# Patient Record
Sex: Female | Born: 1950 | Race: White | Hispanic: No | Marital: Married | Smoking: Former smoker
Health system: Southern US, Community
[De-identification: ages and names within clinical notes are randomized; demographics above are authoritative.]

## PROBLEM LIST (undated history)

## (undated) DIAGNOSIS — B2790 Infectious mononucleosis, unspecified without complication: Secondary | ICD-10-CM

## (undated) DIAGNOSIS — M5136 Other intervertebral disc degeneration, lumbar region: Secondary | ICD-10-CM

## (undated) DIAGNOSIS — E785 Hyperlipidemia, unspecified: Secondary | ICD-10-CM

## (undated) DIAGNOSIS — M51369 Other intervertebral disc degeneration, lumbar region without mention of lumbar back pain or lower extremity pain: Secondary | ICD-10-CM

## (undated) DIAGNOSIS — M5126 Other intervertebral disc displacement, lumbar region: Secondary | ICD-10-CM

## (undated) DIAGNOSIS — Z9289 Personal history of other medical treatment: Secondary | ICD-10-CM

## (undated) DIAGNOSIS — D649 Anemia, unspecified: Secondary | ICD-10-CM

## (undated) DIAGNOSIS — M4800 Spinal stenosis, site unspecified: Secondary | ICD-10-CM

## (undated) DIAGNOSIS — M479 Spondylosis, unspecified: Secondary | ICD-10-CM

## (undated) DIAGNOSIS — C801 Malignant (primary) neoplasm, unspecified: Secondary | ICD-10-CM

## (undated) DIAGNOSIS — F419 Anxiety disorder, unspecified: Secondary | ICD-10-CM

## (undated) DIAGNOSIS — R42 Dizziness and giddiness: Secondary | ICD-10-CM

## (undated) DIAGNOSIS — I10 Essential (primary) hypertension: Secondary | ICD-10-CM

## (undated) HISTORY — PX: OTHER SURGICAL HISTORY: SHX169

## (undated) HISTORY — DX: Other intervertebral disc degeneration, lumbar region without mention of lumbar back pain or lower extremity pain: M51.369

## (undated) HISTORY — DX: Anemia, unspecified: D64.9

## (undated) HISTORY — DX: Spinal stenosis, site unspecified: M48.00

## (undated) HISTORY — DX: Spondylosis, unspecified: M47.9

## (undated) HISTORY — DX: Infectious mononucleosis, unspecified without complication: B27.90

## (undated) HISTORY — PX: INGUINAL HERNIA REPAIR: SUR1180

## (undated) HISTORY — DX: Other intervertebral disc displacement, lumbar region: M51.26

## (undated) HISTORY — DX: Essential (primary) hypertension: I10

## (undated) HISTORY — DX: Anxiety disorder, unspecified: F41.9

## (undated) HISTORY — DX: Dizziness and giddiness: R42

## (undated) HISTORY — DX: Disorder of iron metabolism, unspecified: E83.10

## (undated) HISTORY — PX: APPENDECTOMY: SHX54

## (undated) HISTORY — DX: Personal history of other medical treatment: Z92.89

## (undated) HISTORY — DX: Hyperlipidemia, unspecified: E78.5

## (undated) HISTORY — DX: Other intervertebral disc degeneration, lumbar region: M51.36

## (undated) HISTORY — PX: ABDOMINAL HYSTERECTOMY: SHX81

---

## 1997-05-17 LAB — CONVERTED CEMR LAB: Pap Smear: NORMAL

## 2001-08-22 ENCOUNTER — Emergency Department (HOSPITAL_COMMUNITY): Admission: EM | Admit: 2001-08-22 | Discharge: 2001-08-22 | Payer: Self-pay | Admitting: Emergency Medicine

## 2007-06-09 ENCOUNTER — Encounter: Payer: Self-pay | Admitting: Internal Medicine

## 2007-08-14 ENCOUNTER — Ambulatory Visit: Payer: Self-pay | Admitting: Internal Medicine

## 2007-08-14 DIAGNOSIS — B2790 Infectious mononucleosis, unspecified without complication: Secondary | ICD-10-CM

## 2007-08-14 DIAGNOSIS — R5383 Other fatigue: Secondary | ICD-10-CM

## 2007-08-14 HISTORY — DX: Infectious mononucleosis, unspecified without complication: B27.90

## 2007-08-14 LAB — CONVERTED CEMR LAB
Bilirubin Urine: NEGATIVE
Glucose, Urine, Semiquant: NEGATIVE
HCV Ab: NEGATIVE
Hep B Core Total Ab: NEGATIVE
Hep B S Ab: NEGATIVE
Hepatitis B Surface Ag: NEGATIVE
Ketones, urine, test strip: NEGATIVE
Nitrite: NEGATIVE
Protein, U semiquant: NEGATIVE
Specific Gravity, Urine: 1.015
Urobilinogen, UA: 0.2
Vit D, 1,25-Dihydroxy: 14 — ABNORMAL LOW (ref 30–89)
WBC Urine, dipstick: NEGATIVE
pH: 5

## 2007-08-15 ENCOUNTER — Encounter: Payer: Self-pay | Admitting: Internal Medicine

## 2007-08-18 LAB — CONVERTED CEMR LAB
BUN: 21 mg/dL (ref 6–23)
Basophils Absolute: 0.1 10*3/uL (ref 0.0–0.1)
Basophils Relative: 0.9 % (ref 0.0–1.0)
CO2: 29 meq/L (ref 19–32)
Calcium: 9.3 mg/dL (ref 8.4–10.5)
Chloride: 105 meq/L (ref 96–112)
Cholesterol: 286 mg/dL (ref 0–200)
Creatinine, Ser: 1 mg/dL (ref 0.4–1.2)
Direct LDL: 223.3 mg/dL
Eosinophils Absolute: 0.2 10*3/uL (ref 0.0–0.7)
Eosinophils Relative: 2.1 % (ref 0.0–5.0)
Free T4: 0.9 ng/dL (ref 0.6–1.6)
GFR calc Af Amer: 73 mL/min
GFR calc non Af Amer: 61 mL/min
Glucose, Bld: 90 mg/dL (ref 70–99)
HCT: 43.7 % (ref 36.0–46.0)
HDL: 50.1 mg/dL (ref >39.0)
Hemoglobin: 14.1 g/dL (ref 12.0–15.0)
Lymphocytes Relative: 22.2 % (ref 12.0–46.0)
MCHC: 32.3 g/dL (ref 30.0–36.0)
MCV: 87.9 fL (ref 78.0–100.0)
Monocytes Absolute: 0.4 10*3/uL (ref 0.1–1.0)
Monocytes Relative: 5 % (ref 3.0–12.0)
Neutro Abs: 6.1 10*3/uL (ref 1.4–7.7)
Neutrophils Relative %: 69.8 % (ref 43.0–77.0)
Platelets: 307 10*3/uL (ref 150–400)
Potassium: 4.2 meq/L (ref 3.5–5.1)
RBC: 4.97 M/uL (ref 3.87–5.11)
RDW: 12.7 % (ref 11.5–14.6)
Sed Rate: 12 mm/hr (ref 0–22)
Sodium: 140 meq/L (ref 135–145)
T3, Free: 2.7 pg/mL (ref 2.3–4.2)
TSH: 1.48 microintl units/mL (ref 0.35–5.50)
Total CHOL/HDL Ratio: 5.7
Triglycerides: 133 mg/dL (ref 0–149)
VLDL: 27 mg/dL (ref 0–40)
WBC: 8.7 10*3/uL (ref 4.5–10.5)

## 2007-08-21 ENCOUNTER — Encounter: Payer: Self-pay | Admitting: Internal Medicine

## 2007-08-28 ENCOUNTER — Encounter: Payer: Self-pay | Admitting: Internal Medicine

## 2007-10-24 ENCOUNTER — Encounter: Payer: Self-pay | Admitting: Internal Medicine

## 2007-11-13 ENCOUNTER — Ambulatory Visit: Payer: Self-pay | Admitting: Internal Medicine

## 2007-11-22 LAB — CONVERTED CEMR LAB: Vit D, 1,25-Dihydroxy: 37 (ref 30–89)

## 2008-01-09 ENCOUNTER — Ambulatory Visit: Payer: Self-pay | Admitting: Internal Medicine

## 2008-01-09 ENCOUNTER — Other Ambulatory Visit: Admission: RE | Admit: 2008-01-09 | Discharge: 2008-01-09 | Payer: Self-pay | Admitting: Internal Medicine

## 2008-01-09 ENCOUNTER — Encounter: Payer: Self-pay | Admitting: Internal Medicine

## 2008-01-09 DIAGNOSIS — E785 Hyperlipidemia, unspecified: Secondary | ICD-10-CM | POA: Insufficient documentation

## 2008-03-28 ENCOUNTER — Ambulatory Visit: Payer: Self-pay | Admitting: Internal Medicine

## 2008-03-31 LAB — CONVERTED CEMR LAB
ALT: 21 units/L (ref 0–35)
AST: 25 units/L (ref 0–37)
Albumin: 4.2 g/dL (ref 3.5–5.2)
Alkaline Phosphatase: 59 units/L (ref 39–117)
Bilirubin, Direct: 0.1 mg/dL (ref 0.0–0.3)
Cholesterol: 307 mg/dL (ref 0–200)
Direct LDL: 214.5 mg/dL
HDL: 42.9 mg/dL (ref >39.0)
Total Bilirubin: 1.1 mg/dL (ref 0.3–1.2)
Total CHOL/HDL Ratio: 7.2
Total Protein: 7.2 g/dL (ref 6.0–8.3)
Triglycerides: 122 mg/dL (ref 0–149)
VLDL: 24 mg/dL (ref 0–40)

## 2008-04-03 ENCOUNTER — Ambulatory Visit: Payer: Self-pay | Admitting: Internal Medicine

## 2008-06-05 ENCOUNTER — Ambulatory Visit: Payer: Self-pay | Admitting: Internal Medicine

## 2008-06-05 LAB — CONVERTED CEMR LAB
AST: 27 units/L (ref 0–37)
Albumin: 3.8 g/dL (ref 3.5–5.2)
BUN: 13 mg/dL (ref 6–23)
Calcium: 9.4 mg/dL (ref 8.4–10.5)
Cholesterol: 151 mg/dL (ref 0–200)
Creatinine, Ser: 1 mg/dL (ref 0.4–1.2)
GFR calc Af Amer: 73 mL/min
GFR calc non Af Amer: 61 mL/min
HDL: 43.9 mg/dL (ref >39.0)
LDL Cholesterol: 97 mg/dL (ref 0–99)
Triglycerides: 51 mg/dL (ref 0–149)
VLDL: 10 mg/dL (ref 0–40)

## 2008-06-12 ENCOUNTER — Ambulatory Visit: Payer: Self-pay | Admitting: Internal Medicine

## 2008-06-12 DIAGNOSIS — L659 Nonscarring hair loss, unspecified: Secondary | ICD-10-CM | POA: Insufficient documentation

## 2008-09-05 ENCOUNTER — Encounter: Payer: Self-pay | Admitting: Internal Medicine

## 2008-09-11 ENCOUNTER — Ambulatory Visit: Payer: Self-pay | Admitting: Internal Medicine

## 2008-09-11 DIAGNOSIS — I10 Essential (primary) hypertension: Secondary | ICD-10-CM

## 2008-09-16 LAB — CONVERTED CEMR LAB
CO2: 32 meq/L (ref 19–32)
Calcium: 9.2 mg/dL (ref 8.4–10.5)
Chloride: 107 meq/L (ref 96–112)
Potassium: 5.1 meq/L (ref 3.5–5.1)
Sodium: 145 meq/L (ref 135–145)

## 2009-06-02 ENCOUNTER — Ambulatory Visit: Payer: Self-pay | Admitting: Internal Medicine

## 2009-06-02 LAB — CONVERTED CEMR LAB
ALT: 19 units/L (ref 0–35)
AST: 23 units/L (ref 0–37)
Albumin: 4.3 g/dL (ref 3.5–5.2)
Alkaline Phosphatase: 56 units/L (ref 39–117)
BUN: 17 mg/dL (ref 6–23)
Basophils Absolute: 0 10*3/uL (ref 0.0–0.1)
Basophils Relative: 0.3 % (ref 0.0–3.0)
Bilirubin Urine: NEGATIVE
CO2: 28 meq/L (ref 19–32)
Chloride: 105 meq/L (ref 96–112)
Cholesterol: 156 mg/dL (ref 0–200)
Creatinine, Ser: 1.1 mg/dL (ref 0.4–1.2)
Eosinophils Relative: 4.1 % (ref 0.0–5.0)
HCT: 43 % (ref 36.0–46.0)
Hemoglobin: 14.1 g/dL (ref 12.0–15.0)
Ketones, urine, test strip: NEGATIVE
LDL Cholesterol: 87 mg/dL (ref 0–99)
Lymphocytes Relative: 30.2 % (ref 12.0–46.0)
Lymphs Abs: 2.2 10*3/uL (ref 0.7–4.0)
Monocytes Relative: 7.9 % (ref 3.0–12.0)
Neutro Abs: 4.2 10*3/uL (ref 1.4–7.7)
RBC: 4.87 M/uL (ref 3.87–5.11)
Specific Gravity, Urine: 1.02
TSH: 2.86 microintl units/mL (ref 0.35–5.50)
Total Protein: 7.3 g/dL (ref 6.0–8.3)
Triglycerides: 134 mg/dL (ref 0.0–149.0)
Urobilinogen, UA: 1

## 2009-06-09 ENCOUNTER — Ambulatory Visit: Payer: Self-pay | Admitting: Internal Medicine

## 2009-06-09 DIAGNOSIS — R82998 Other abnormal findings in urine: Secondary | ICD-10-CM

## 2009-06-09 LAB — CONVERTED CEMR LAB
Ketones, urine, test strip: NEGATIVE
Nitrite: NEGATIVE
Urobilinogen, UA: 1
WBC Urine, dipstick: NEGATIVE

## 2009-06-11 ENCOUNTER — Encounter (INDEPENDENT_AMBULATORY_CARE_PROVIDER_SITE_OTHER): Payer: Self-pay | Admitting: *Deleted

## 2009-06-24 ENCOUNTER — Encounter (INDEPENDENT_AMBULATORY_CARE_PROVIDER_SITE_OTHER): Payer: Self-pay | Admitting: *Deleted

## 2009-06-25 ENCOUNTER — Ambulatory Visit: Payer: Self-pay | Admitting: Gastroenterology

## 2009-07-09 ENCOUNTER — Ambulatory Visit: Payer: Self-pay | Admitting: Gastroenterology

## 2009-07-09 LAB — HM COLONOSCOPY

## 2009-09-08 ENCOUNTER — Encounter: Payer: Self-pay | Admitting: Internal Medicine

## 2009-09-08 LAB — HM MAMMOGRAPHY: HM Mammogram: NORMAL

## 2009-09-09 ENCOUNTER — Ambulatory Visit: Payer: Self-pay | Admitting: Internal Medicine

## 2009-09-09 LAB — CONVERTED CEMR LAB
Bilirubin Urine: NEGATIVE
Nitrite: NEGATIVE
Protein, U semiquant: NEGATIVE
Urobilinogen, UA: 0.2

## 2009-09-10 LAB — CONVERTED CEMR LAB
BUN: 15 mg/dL (ref 6–23)
CO2: 31 meq/L (ref 19–32)
Glucose, Bld: 87 mg/dL (ref 70–99)
Potassium: 4.1 meq/L (ref 3.5–5.1)
Sodium: 141 meq/L (ref 135–145)

## 2010-06-05 ENCOUNTER — Other Ambulatory Visit: Payer: Self-pay | Admitting: Internal Medicine

## 2010-06-05 ENCOUNTER — Ambulatory Visit
Admission: RE | Admit: 2010-06-05 | Discharge: 2010-06-05 | Payer: Self-pay | Source: Home / Self Care | Attending: Internal Medicine | Admitting: Internal Medicine

## 2010-06-05 LAB — CBC WITH DIFFERENTIAL/PLATELET
Basophils Absolute: 0 10*3/uL (ref 0.0–0.1)
Basophils Relative: 0.4 % (ref 0.0–3.0)
Eosinophils Absolute: 0.3 10*3/uL (ref 0.0–0.7)
Eosinophils Relative: 3.7 % (ref 0.0–5.0)
HCT: 40.6 % (ref 36.0–46.0)
Hemoglobin: 13.6 g/dL (ref 12.0–15.0)
Lymphocytes Relative: 28.5 % (ref 12.0–46.0)
Lymphs Abs: 2.2 10*3/uL (ref 0.7–4.0)
MCHC: 33.4 g/dL (ref 30.0–36.0)
MCV: 86.6 fl (ref 78.0–100.0)
Monocytes Absolute: 0.5 10*3/uL (ref 0.1–1.0)
Monocytes Relative: 7 % (ref 3.0–12.0)
Neutro Abs: 4.6 10*3/uL (ref 1.4–7.7)
Neutrophils Relative %: 60.4 % (ref 43.0–77.0)
Platelets: 299 10*3/uL (ref 150.0–400.0)
RBC: 4.69 Mil/uL (ref 3.87–5.11)
RDW: 14.1 % (ref 11.5–14.6)
WBC: 7.7 10*3/uL (ref 4.5–10.5)

## 2010-06-05 LAB — BASIC METABOLIC PANEL
BUN: 18 mg/dL (ref 6–23)
CO2: 31 mEq/L (ref 19–32)
Calcium: 9.5 mg/dL (ref 8.4–10.5)
Chloride: 102 mEq/L (ref 96–112)
Creatinine, Ser: 1.1 mg/dL (ref 0.4–1.2)
GFR: 55.54 mL/min — ABNORMAL LOW (ref >60.00)
Glucose, Bld: 96 mg/dL (ref 70–99)
Potassium: 4.7 mEq/L (ref 3.5–5.1)
Sodium: 142 mEq/L (ref 135–145)

## 2010-06-05 LAB — LIPID PANEL
Cholesterol: 161 mg/dL (ref 0–200)
HDL: 42.2 mg/dL (ref >39.00)
LDL Cholesterol: 91 mg/dL (ref 0–99)
Total CHOL/HDL Ratio: 4
Triglycerides: 138 mg/dL (ref 0.0–149.0)
VLDL: 27.6 mg/dL (ref 0.0–40.0)

## 2010-06-05 LAB — HEPATIC FUNCTION PANEL
ALT: 17 U/L (ref 0–35)
AST: 19 U/L (ref 0–37)
Albumin: 4.2 g/dL (ref 3.5–5.2)
Alkaline Phosphatase: 68 U/L (ref 39–117)
Bilirubin, Direct: 0.2 mg/dL (ref 0.0–0.3)
Total Bilirubin: 1 mg/dL (ref 0.3–1.2)
Total Protein: 7 g/dL (ref 6.0–8.3)

## 2010-06-05 LAB — TSH: TSH: 2.08 u[IU]/mL (ref 0.35–5.50)

## 2010-06-12 ENCOUNTER — Encounter: Payer: Self-pay | Admitting: Internal Medicine

## 2010-06-12 ENCOUNTER — Ambulatory Visit
Admission: RE | Admit: 2010-06-12 | Discharge: 2010-06-12 | Payer: Self-pay | Source: Home / Self Care | Attending: Internal Medicine | Admitting: Internal Medicine

## 2010-06-12 DIAGNOSIS — E669 Obesity, unspecified: Secondary | ICD-10-CM | POA: Insufficient documentation

## 2010-06-18 NOTE — Assessment & Plan Note (Signed)
Summary: 3 MO ROV/MM   Vital Signs:  Patient profile:   60 year old female Menstrual status:  hysterectomy Weight:      153 pounds Pulse rate:   105 / minute BP sitting:   138 / 62  (left arm) Cuff size:   regular  Vitals Entered By: Romualdo Bolk, CMA Duncan Dull) (September 09, 2009 8:36 AM) CC: Follow-up visit on blood pressure, Hypertension Management   Serial Vital Signs/Assessments:  Time      Position  BP       Pulse  Resp  Temp     By 8:40 AM             134/80   105                   Romualdo Bolk, CMA (AAMA)                     118/78                         Madelin Headings MD  Comments: 8:40 AM Pt's machine By: Romualdo Bolk, CMA (AAMA)  sitting left   reg cuff   and machine  right   125/68 By: Madelin Headings MD    History of Present Illness: Kendra Mcfarland  comes in today  for above  . ? no change in readings at home tends to get nervous at times and bp will go up. But feels ok  130/80    range at home.  MOm has labile ht but doesnt always take meds.  NO UTI hx of hematuria. Got colonoscopy done  since last visit.  Hypertension History:      She complains of peripheral edema, but denies headache, chest pain, palpitations, dyspnea with exertion, orthopnea, PND, visual symptoms, neurologic problems, syncope, and side effects from treatment.  She notes no problems with any antihypertensive medication side effects.        Positive major cardiovascular risk factors include female age 9 years old or older, hyperlipidemia, and hypertension.  Negative major cardiovascular risk factors include non-tobacco-user status.    Preventive Care Screening  Mammogram:    Date:  09/08/2009    Results:  normal     Preventive Screening-Counseling & Management  Alcohol-Tobacco     Alcohol drinks/day: <1     Alcohol type: wine     Smoking Status: quit     Year Quit: 1996  Caffeine-Diet-Exercise     Caffeine use/day: 8 +     Does Patient Exercise: yes     Type of  exercise: Walking     Times/week: 4  Current Medications (verified): 1)  Vitamin D3 1000 Unit Tabs (Cholecalciferol) 2)  Crestor 10 Mg Tabs (Rosuvastatin Calcium) .Marland Kitchen.. 1 By Mouth Once Daily 3)  Lisinopril-Hydrochlorothiazide 20-12.5 Mg Tabs (Lisinopril-Hydrochlorothiazide) .Marland Kitchen.. 1 By Mouth Once Daily  Allergies (verified): 1)  ! Penicillin 2)  ! Sulfa 3)  ! * Aleve  Past History:  Past medical, surgical, family and social histories (including risk factors) reviewed for relevance to current acute and chronic problems.  Past Medical History: Reviewed history from 06/12/2008 and no changes required. g2p2 Blood transfusion  vaginal bleeding anemia years ago G2P2   Consults None [facility]    hx of basal cell.    Past Surgical History: Reviewed history from 04/03/2008 and no changes required. 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 Family History Thyroid disease- Sister Hyper and Sister Hypo Family History Hypertension- Mom and Sister Family History of Arthritis- Whole Family Family History High cholesterol mom and sister   Father 61 MI  prob had HT no DM     Mom had angioplasty 59  age.  73    Social History: Reviewed history from 06/09/2009 and no changes required. Former Smoker  no ets   Alcohol use-no Drug use-no Regular exercise-yes hhof 2  pet dog     Good sleep  Review of Systems  The patient denies anorexia, fever, chest pain, syncope, dyspnea on exertion, and peripheral edema.    Physical Exam  General:  alert, well-developed, well-nourished, and well-hydrated.   Head:  normocephalic and atraumatic.   Lungs:  normal respiratory effort, no intercostal retractions, and no accessory muscle use.   Heart:  normal  rate, regular rhythm, and no murmur.   Extremities:  no clubbing cyanosis or edema  Skin:  turgor normal and color normal.   Cervical Nodes:  No lymphadenopathy noted Psych:  Normal eye contact, appropriate affect. Cognition appears normal.    Impression & Recommendations:  Problem # 1:  HYPERTENSION (ICD-401.9) did bp cuff and outs and they correlate.    erratic readings but actually better in office  after resting.   and very good.    will continue meds and monitor  Her updated medication list for this problem includes:    Lisinopril-hydrochlorothiazide 20-12.5 Mg Tabs (Lisinopril-hydrochlorothiazide) .Marland Kitchen... 1 by mouth once daily  Orders: TLB-BMP (Basic Metabolic Panel-BMET) (80048-METABOL)  Problem # 2:  URINALYSIS, ABNORMAL (ICD-791.9) by DS unclear significance no signs  if abnormal can arrange for microscopic  if needed Orders: UA Dipstick w/o Micro (automated)  (81003)  Complete Medication List: 1)  Vitamin D3 1000 Unit Tabs (Cholecalciferol) 2)  Crestor 10 Mg Tabs (Rosuvastatin calcium) .Marland Kitchen.. 1 by mouth once daily 3)  Lisinopril-hydrochlorothiazide 20-12.5 Mg Tabs (Lisinopril-hydrochlorothiazide) .Marland Kitchen.. 1 by mouth once daily  Hypertension Assessment/Plan:      The patient's hypertensive risk group is category B: At least one risk factor (excluding diabetes) with no target organ damage.  Her calculated 10 year risk of coronary heart disease is 8 %.  Today's blood pressure is 138/62.  Her blood pressure goal is < 140/90.  Patient Instructions: 1)  You will be informed of lab results when available.  2)  conitinue monitoring your BP   3)  ROV  with cpx when due 2012 4)  or as needed.  Prevention & Chronic Care Immunizations   Influenza vaccine: Fluvax 3+  (06/09/2009)    Tetanus booster: 05/18/2007: Historical    Pneumococcal vaccine: Not documented  Colorectal Screening   Hemoccult: Not documented    Colonoscopy: DONE  (07/09/2009)   Colonoscopy due:  06/2019  Other Screening   Pap smear: Normal  (05/17/1997)    Mammogram: normal  (09/08/2009)   Smoking status: quit  (09/09/2009)  Lipids   Total Cholesterol: 156  (06/02/2009)   LDL: 87  (06/02/2009)   LDL Direct: 214.5  (03/28/2008)   HDL: 42.50  (06/02/2009)   Triglycerides: 134.0  (06/02/2009)    SGOT (AST): 23  (06/02/2009)   SGPT (ALT): 19  (06/02/2009)   Alkaline phosphatase: 56  (06/02/2009)   Total bilirubin: 0.8  (  06/02/2009)  Hypertension   Last Blood Pressure: 138 / 62  (09/09/2009)   Serum creatinine: 1.1  (06/02/2009)   Serum potassium 4.9  (06/02/2009)  Self-Management Support :    Hypertension self-management support: Not documented    Lipid self-management support: Not documented    Laboratory Results   Urine Tests    Routine Urinalysis   Color: yellow Appearance: Clear Glucose: negative   (Normal Range: Negative) Bilirubin: negative   (Normal Range: Negative) Ketone: negative   (Normal Range: Negative) Spec. Gravity: 1.015   (Normal Range: 1.003-1.035) Blood: trace-lysed   (Normal Range: Negative) pH: 6.5   (Normal Range: 5.0-8.0) Protein: negative   (Normal Range: Negative) Urobilinogen: 0.2   (Normal Range: 0-1) Nitrite: negative   (Normal Range: Negative) Leukocyte Esterace: negative   (Normal Range: Negative)    Comments: Rita Ohara  September 09, 2009 1:17 PM

## 2010-06-18 NOTE — Assessment & Plan Note (Signed)
Summary: cpx//ccm   Vital Signs:  Patient profile:   60 year old female Menstrual status:  hysterectomy Height:      59 inches Weight:      158 pounds BMI:     32.03 Pulse rate:   72 / minute BP sitting:   140 / 80  (left arm) Cuff size:   regular  Vitals Entered By: Romualdo Bolk, CMA (AAMA) (June 12, 2010 8:47 AM)  Nutrition Counseling: Patient's BMI is greater than 25 and therefore counseled on weight management options.  Serial Vital Signs/Assessments:  Time      Position  BP       Pulse  Resp  Temp     By                     120/78                         Madelin Headings MD  Comments: right arm sittin By: Madelin Headings MD   CC: CPX-Discuss doing a pap   History of Present Illness: Kendra Mcfarland comes in today    for preventive visit  Sinc e ast visit  no major changes in health.Doing weight watchers now. Cant take aleve. but has hx of  rx for ?  H x of spondyloethesis.    stiffer  than usual.   when arises from floor  but no difficulty walking  Aleve gave ger hand and itchiy palms and arms  BP usually 130 range no se of med LIPIDS: no se of meds    Preventive Care Screening  Prior Values:    Pap Smear:  Normal (05/17/1997)    Mammogram:  normal (09/08/2009)    Colonoscopy:  DONE (07/09/2009)    Last Tetanus Booster:  Historical (05/18/2007)   Preventive Screening-Counseling & Management  Alcohol-Tobacco     Alcohol drinks/day: <1     Alcohol type: wine     Smoking Status: quit     Year Quit: 1996  Caffeine-Diet-Exercise     Caffeine use/day: 8 +     Does Patient Exercise: yes     Type of exercise: Walking     Times/week: 4  Hep-HIV-STD-Contraception     Dental Visit-last 6 months yes     Sun Exposure-Excessive: no  Safety-Violence-Falls     Seat Belt Use: yes     Firearms in the Home: firearms in the home     Firearm Counseling: not indicated; uses recommended firearm safety measures     Smoke Detectors: yes  Current Medications  (verified): 1)  Vitamin D3 1000 Unit Tabs (Cholecalciferol) 2)  Crestor 10 Mg Tabs (Rosuvastatin Calcium) .Marland Kitchen.. 1 By Mouth Once Daily 3)  Lisinopril-Hydrochlorothiazide 20-12.5 Mg Tabs (Lisinopril-Hydrochlorothiazide) .Marland Kitchen.. 1 By Mouth Once Daily  Allergies (verified): 1)  ! Penicillin 2)  ! Sulfa 3)  ! * Aleve  Past History:  Past medical, surgical, family and social histories (including risk factors) reviewed, and no changes noted (except as noted below).  Past Medical History: Reviewed history from 06/12/2008 and no changes required. g2p2 Blood transfusion  vaginal bleeding anemia years ago G2P2   Consults None [facility]    hx of basal cell.    Past Surgical History: Reviewed history from 04/03/2008 and no changes required. 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 Family History Thyroid disease- Sister Hyper and Sister Hypo Family History Hypertension- Mom and Sister Family History of Arthritis- Whole Family Family History High cholesterol mom and sister   Father 46 MI  prob had HT no DM     Mom had angioplasty 58  age.  86   short term memory loss  Social History: Reviewed history from 06/09/2009 and no changes required. Former Smoker  no ets   Alcohol use-no Drug use-no Regular exercise-yes hhof 1  pet dog     Good sleep  Review of Systems  The patient denies anorexia, fever, weight loss, weight gain, vision loss, decreased hearing, hoarseness, chest pain, syncope, dyspnea on exertion, peripheral edema, prolonged cough, headaches, hemoptysis, abdominal pain, melena, hematochezia, severe indigestion/heartburn, hematuria, incontinence, muscle weakness, suspicious skin lesions, transient  blindness, difficulty walking, depression, unusual weight change, abnormal bleeding, enlarged lymph nodes, angioedema, and breast masses.    Physical Exam  General:  alert, well-developed, well-nourished, and well-hydrated.   Head:  normocephalic and atraumatic.   Eyes:  PERRL, EOMs full, conjunctiva clear  Ears:  R ear normal and L ear normal.  no external deformities.   Nose:  External nasal examination shows no deformity or inflammation. Nasal mucosa are pink and moist without lesions or exudates. Mouth:  good dentition and pharynx pink and moist.   Neck:  No deformities, masses, or tenderness noted. Breasts:  No mass, nodules, thickening, tenderness, bulging, retraction, inflamation, nipple discharge or skin changes noted.   Lungs:  Normal respiratory effort, chest expands symmetrically. Lungs are clear to auscultation, no crackles or wheezes. Heart:  Normal rate and regular rhythm. S1 and S2 normal without gallop, murmur, click, rub or other extra sounds. Abdomen:  Bowel sounds positive,abdomen soft and non-tender without masses, organomegaly or hernias noted. Rectal:  No external abnormalities noted. Normal sphincter tone. No rectal masses or tenderness. henorr tag  Genitalia:  Pelvic Exam:        External: normal female genitalia without lesions or masses        Vagina: normal without lesions or masses        Cervix: absent        Adnexa: normal bimanual exam without masses or fullness        Uterus: absent        Pap smear: not performed Msk:  no joint warmth, no redness over joints, and no joint deformities.  neg slr Pulses:  pulses intact without delay   Extremities:  no clubbing cyanosis or edema  Neurologic:  alert & oriented X3, strength normal in all extremities, gait normal, and DTRs symmetrical and normal.   Skin:  turgor normal, color normal, no ecchymoses, no petechiae, and no purpura.   Cervical Nodes:  No lymphadenopathy noted Axillary Nodes:  No palpable  lymphadenopathy Inguinal Nodes:  No significant adenopathy Psych:  Normal eye contact, appropriate affect. Cognition appears normal.    Impression & Recommendations:  Problem # 1:  PREVENTIVE HEALTH CARE (ICD-V70.0)  agree with weight watchers  Discussed nutrition,exercise,diet,healthy weight, vitamin D and calcium.   Orders: EKG w/ Interpretation (93000)  Problem # 2:  HYPERTENSION (ICD-401.9)  EKG nl  Her updated medication list for this problem includes:    Lisinopril-hydrochlorothiazide 20-12.5 Mg Tabs (Lisinopril-hydrochlorothiazide) .Marland Kitchen... 1 by mouth once daily  BP today: 140/80 Prior BP: 138/62 (09/09/2009)  Prior 10 Yr Risk Heart Disease:  8 % (09/11/2008)  Labs Reviewed: K+: 4.7 (06/05/2010) Creat: : 1.1 (06/05/2010)   Chol: 161 (06/05/2010)   HDL: 42.20 (06/05/2010)   LDL: 91 (06/05/2010)   TG: 138.0 (06/05/2010)  Orders: EKG w/ Interpretation (93000)  Problem # 3:  HYPERLIPIDEMIA (ICD-272.4)  Her updated medication list for this problem includes:    Crestor 10 Mg Tabs (Rosuvastatin calcium) .Marland Kitchen... 1 by mouth once daily  Labs Reviewed: SGOT: 19 (06/05/2010)   SGPT: 17 (06/05/2010)  Prior 10 Yr Risk Heart Disease: 8 % (09/11/2008)   HDL:42.20 (06/05/2010), 42.50 (06/02/2009)  LDL:91 (06/05/2010), 87 (06/02/2009)  Chol:161 (06/05/2010), 156 (06/02/2009)  Trig:138.0 (06/05/2010), 134.0 (06/02/2009)  Problem # 4:  OBESITY (ICD-278.00) counseled  Ht: 59 (06/12/2010)   Wt: 158 (06/12/2010)   BMI: 32.03 (06/12/2010)  Complete Medication List: 1)  Vitamin D3 1000 Unit Tabs (Cholecalciferol) 2)  Crestor 10 Mg Tabs (Rosuvastatin calcium) .Marland Kitchen.. 1 by mouth once daily 3)  Lisinopril-hydrochlorothiazide 20-12.5 Mg Tabs (Lisinopril-hydrochlorothiazide) .Marland Kitchen.. 1 by mouth once daily  Patient Instructions: 1)  aleve and advil  are  in the same class for   allergic reaction. 2)  Tylenol is ok as needed. 3)  Do exercises from before. 4)  Continue medication. 5)  cpx with labs  in a year . 6)  Call if  want to get a shingles vaccine  ( zostavax. ) Prescriptions: CRESTOR 10 MG TABS (ROSUVASTATIN CALCIUM) 1 by mouth once daily  #90 x 3   Entered and Authorized by:   Madelin Headings MD   Signed by:   Madelin Headings MD on 06/12/2010   Method used:   Print then Give to Patient   RxID:   [id number] LISINOPRIL-HYDROCHLOROTHIAZIDE 20-12.5 MG TABS (LISINOPRIL-HYDROCHLOROTHIAZIDE) 1 by mouth once daily  #90 Tablet x 3   Entered and Authorized by:   Madelin Headings MD   Signed by:   Madelin Headings MD on 06/12/2010   Method used:   Print then Give to Patient   RxID:   [id number]    Orders Added: 1)  EKG w/ Interpretation [93000] 2)  Est. Patient 40-64 years [99396]    Prevention & Chronic Care Immunizations   Influenza vaccine: Fluvax 3+  (06/09/2009)    Tetanus booster: 05/18/2007: Historical    Pneumococcal vaccine: Not documented    H. zoster vaccine: Not documented  Colorectal Screening   Hemoccult: Not documented    Colonoscopy: DONE  (07/09/2009)   Colonoscopy due: 06/2019  Other Screening   Pap smear: Normal  (05/17/1997)    Mammogram: normal  (09/08/2009)    DXA bone density scan: Not documented   Smoking status: quit  (06/12/2010)  Lipids   Total Cholesterol: 161  (06/05/2010)   LDL: 91  (06/05/2010)   LDL Direct: 214.5  (03/28/2008)   HDL: 42.20  (06/05/2010)   Triglycerides: 138.0  (06/05/2010)    SGOT (AST): 19  (06/05/2010)   SGPT (ALT): 17  (06/05/2010)   Alkaline phosphatase: 68  (06/05/2010)   Total bilirubin: 1.0  (06/05/2010)  Hypertension   Last Blood Pressure: 140 / 80  (06/12/2010)   Serum creatinine: 1.1  (06/05/2010)   Serum potassium 4.7  (06/05/2010)  Self-Management Support :    Hypertension self-management support: Not documented    Lipid self-management support: Not documented

## 2010-06-18 NOTE — Letter (Signed)
Summary: [facility] Instructions  [facility]  [address] [place], [place] [address]   Phone: [phone]  Fax: [phone]       Kendra Mcfarland    04-Dec-1950    MRN: [id number]        Procedure Day /Date: 07/09/09  Wednesday     Arrival Time: 8:30am      Procedure Time: 9:30am     Location of Procedure:                    _x _  [facility] (4th Floor)   PREPARATION FOR COLONOSCOPY WITH MOVIPREP   Starting 5 days prior to your procedure _2/18/11 _ do not eat nuts, seeds, popcorn, corn, beans, peas,  salads, or any raw vegetables.  Do not take any fiber supplements (e.g. Metamucil, Citrucel, and Benefiber).  THE DAY BEFORE YOUR PROCEDURE          DATE:  07/08/09  DAY:  Tuesday 1.  Drink clear liquids the entire day-NO SOLID FOOD  2.  Do not drink anything colored red or purple.  Avoid juices with pulp.  No orange juice.  3.  Drink at least 64 oz. (8 glasses) of fluid/clear liquids during the day to prevent dehydration and help the prep work efficiently.  CLEAR LIQUIDS INCLUDE: Water Jello Ice Popsicles Tea (sugar ok, no milk/cream) Powdered fruit flavored drinks Coffee (sugar ok, no milk/cream) Gatorade Juice: apple, white grape, white cranberry  Lemonade Clear bullion, consomm, broth Carbonated beverages (any kind) Strained chicken noodle soup Hard Candy                             4.  In the morning, mix first dose of MoviPrep solution:    Empty 1 Pouch A and 1 Pouch B into the disposable container    Add lukewarm drinking water to the top line of the container. Mix to dissolve    Refrigerate (mixed solution should be used within 24 hrs)  5.  Begin drinking the prep at 5:00 p.m. The MoviPrep container is divided by 4 marks.   Every 15 minutes drink the solution down to the next mark (approximately 8 oz) until the full liter is complete.   6.  Follow completed prep with 16 oz of clear liquid of your choice (Nothing red or purple).   Continue to drink clear liquids until bedtime.  7.  Before going to bed, mix second dose of MoviPrep solution:    Empty 1 Pouch A and 1 Pouch B into the disposable container    Add lukewarm drinking water to the top line of the container. Mix to dissolve    Refrigerate  THE DAY OF YOUR PROCEDURE      DATE:  07/09/09 DAY:  Wednesday  Beginning at  4:30 a.m. (5 hours before procedure):         1. Every 15 minutes, drink the solution down to the next mark (approx 8 oz) until the full liter is complete.  2. Follow completed prep with 16 oz. of clear liquid of your choice.    3. You may drink clear liquids until  7:30am (2 HOURS BEFORE PROCEDURE).   MEDICATION INSTRUCTIONS  Unless otherwise instructed, you should take regular prescription medications with a small sip of water   as early as possible the morning of your procedure.           OTHER INSTRUCTIONS  You will need a responsible adult at least 61 years of age to accompany you and drive you home.   This person must remain in the waiting room during your procedure.  Wear loose fitting clothing that is easily removed.  Leave jewelry and other valuables at home.  However, you may wish to bring a book to read or  an iPod/MP3 player to listen to music as you wait for your procedure to start.  Remove all body piercing jewelry and leave at home.  Total time from sign-in until discharge is approximately 2-3 hours.  You should go home directly after your procedure and rest.  You can resume normal activities the  day after your procedure.  The day of your procedure you should not:   Drive   Make legal decisions   Operate machinery   Drink alcohol   Return to work  You will receive specific instructions about eating, activities and medications before you leave.    The above instructions have been reviewed and explained to me by   Wyona Almas RN  June 25, 2009 9:40 AM     I fully understand and can  verbalize these instructions _____________________________ Date _________

## 2010-06-18 NOTE — Miscellaneous (Signed)
Summary: [facility] Previsit/prep  Clinical Lists Changes  Medications: Added new medication of MOVIPREP 100 GM  SOLR (PEG-KCL-NACL-NASULF-NA ASC-C) As per prep instructions. - Signed Rx of MOVIPREP 100 GM  SOLR (PEG-KCL-NACL-NASULF-NA ASC-C) As per prep instructions.;  #1 x 0;  Signed;  Entered by: Wyona Almas RN;  Authorized by: Mardella Layman MD [facility];  Method used: Electronically to [employer] [facility]. [place]. [phone]*, [address], [place], [place], [place]  [address], Ph: [phone] or [phone], Fax: [phone] Allergies: Added new allergy or adverse reaction of * ALEVE Changed allergy or adverse reaction from SULFA to SULFA    Prescriptions: MOVIPREP 100 GM  SOLR (PEG-KCL-NACL-NASULF-NA ASC-C) As per prep instructions.  #1 x 0   Entered by:   Wyona Almas RN   Authorized by:   Mardella Layman MD [facility]   Signed by:   Wyona Almas RN on 06/25/2009   Method used:   Electronically to        [facility] [address]. [phone]* (retail)       [address]       [place], [place]  [address]       Ph: [phone] or [phone]       Fax: [phone]   RxID:   [id number]

## 2010-06-18 NOTE — Letter (Signed)
Summary: Previsit letter  [facility]  [address] [place], [place] [address]   Phone: [phone]  Fax: [phone]       06/11/2009 MRN: [id number]  Kendra Mcfarland [address] [place], [place]  [address]  Dear [name],  Welcome to the Gastroenterology Division at [facility].    You are scheduled to see a nurse for your pre-procedure visit on 06-25-09 at 9:00a.m. on the 3rd floor at [facility], [address]  We ask that you try to arrive at our office 15 minutes prior to your appointment time to allow for check-in.  Your nurse visit will consist of discussing your medical and surgical history, your immediate family medical history, and your medications.    Please bring a complete list of all your medications or, if you prefer, bring the medication bottles and we will list them.  We will need to be aware of both prescribed and over the counter drugs.  We will need to know exact dosage information as well.  If you are on blood thinners (Coumadin, Plavix, Aggrenox, Ticlid, etc.) please call our office today/prior to your appointment, as we need to consult with your physician about holding your medication.   Please be prepared to read and sign documents such as consent forms, a financial agreement, and acknowledgement forms.  If necessary, and with your consent, a friend or relative is welcome to sit-in on the nurse visit with you.  Please bring your insurance card so that we may make a copy of it.  If your insurance requires a referral to see a specialist, please bring your referral form from your primary care physician.  No co-pay is required for this nurse visit.     If you cannot keep your appointment, please call [phone] to cancel or reschedule prior to your appointment date.  This allows [place] the opportunity to schedule an appointment for another patient in need of care.    Thank you for choosing [facility] for your medical needs.  We  appreciate the opportunity to care for you.  Please visit [place] at our website  to learn more about our practice.                     Sincerely.                                                                                                                   The Gastroenterology Division

## 2010-06-18 NOTE — Procedures (Signed)
Summary: Colonoscopy  Patient: [name] Note: All result statuses are Final unless otherwise noted.  Tests: (1) Colonoscopy (COL)   COL Colonoscopy           DONE     [facility]     [address]     [place], [place]  [address]           COLONOSCOPY PROCEDURE REPORT           PATIENT:  Kendra, Mcfarland  MR#:  [id number]     BIRTHDATE:  09-13-1950, 58 yrs. old  GENDER:  female           ENDOSCOPIST:  Vania Rea. Jarold Motto, MD, [facility]     Referred by:  Neta Mends. Panosh, M.D.           PROCEDURE DATE:  07/09/2009     PROCEDURE:  Average-risk screening colonoscopy     [id number]     ASA CLASS:  Class II     INDICATIONS:  Routine Risk Screening           MEDICATIONS:   Fentanyl 50 mcg IV, Versed 8 mg IV           DESCRIPTION OF PROCEDURE:   After the risks benefits and     alternatives of the procedure were thoroughly explained, informed     consent was obtained.  Digital rectal exam was performed and     revealed no abnormalities.   The LB CF-[id number] endoscope     was introduced through the anus and advanced to the cecum, which     was identified by both the appendix and ileocecal valve, limited     by a redundant colon.  VERY LONG AND TORTUOUS AND REDUNDANT COLON     NOTED.  The quality of the prep was adequate, using MoviPrep.  The     instrument was then slowly withdrawn as the colon was fully     examined.     <<PROCEDUREIMAGES>>           FINDINGS:  No polyps or cancers were seen.  This was otherwise a     normal examination of the colon.   Retroflexed views in the rectum     revealed no abnormalities.    The scope was then withdrawn from     the patient and the procedure completed.           COMPLICATIONS:  None           ENDOSCOPIC IMPRESSION:     1) No polyps or cancers     2) Otherwise normal examination     RECOMMENDATIONS:     1) Continue current colorectal screening recommendations for     "routine risk" patients with a repeat colonoscopy in 10  years.           REPEAT EXAM:  No           ______________________________     Vania Rea. Jarold Motto, MD, [facility]           CC:           n.     eSIGNED:   Vania Rea. Ashon Rosenberg at 07/09/2009 09:56 AM           Nicholes Calamity, [id number]  Note: An exclamation mark (!) indicates a result that was not dispersed into the flowsheet. Document Creation Date: 07/09/2009 9:57 AM _______________________________________________________________________  (1) Order result status: Final Collection or observation date-time: 07/09/2009 09:53  Requested date-time:  Receipt date-time:  Reported date-time:  Referring Physician:   Ordering Physician: Sheryn Bison [phone]) Specimen Source:  Source: Launa Grill Order Number: [id number] Lab site:   Appended Document: Colonoscopy    Clinical Lists Changes  Observations: Added new observation of COLONNXTDUE: 06/2019 (07/09/2009 13:30)

## 2010-06-18 NOTE — Assessment & Plan Note (Signed)
Summary: cpx/njr   Vital Signs:  Patient profile:   60 year old female Menstrual status:  hysterectomy Height:      59 inches Weight:      153.5 pounds BMI:     31.12 Pulse rate:   72 / minute BP sitting:   140 / 80  (right arm) Cuff size:   regular  Vitals Entered By: Romualdo Bolk, CMA (AAMA) (June 09, 2009 9:38 AM)  Serial Vital Signs/Assessments:  Time      Position  BP       Pulse  Resp  Temp     By 9:42 AM             139/80                         Romualdo Bolk, CMA (AAMA)  Comments: 9:42 AM Pt's machine By: Romualdo Bolk, CMA (AAMA)   CC: CPX no pap due to having a hyst   History of Present Illness: Kendra Mcfarland  comesin for preventive visit.  Since last visit  here  there have been no major changes in health status  , BP  : not ideal often 140 range.  taking meds but not attending to lifestyle intervention . No cp sob  cough. LIPID: No se of meds  .  Weight gain   okk of weight watchers.   Hasnt had colonscopy yet . will go if  refer this time.  Preventive Care Screening  Last Tetanus Booster:    Date:  05/18/2007    Results:  Historical   Prior Values:    Pap Smear:  Normal (05/17/1997)    Mammogram:  normal (09/03/2008)   Preventive Screening-Counseling & Management  Alcohol-Tobacco     Alcohol drinks/day: <1     Alcohol type: wine     Smoking Status: quit     Year Quit: 1996  Caffeine-Diet-Exercise     Caffeine use/day: 8 +     Does Patient Exercise: yes     Type of exercise: Walking     Times/week: 4  Hep-HIV-STD-Contraception     Dental Visit-last 6 months yes     Sun Exposure-Excessive: no  Safety-Violence-Falls     Seat Belt Use: yes     Firearms in the Home: firearms in the home     Firearm Counseling: not indicated; uses recommended firearm safety measures     Smoke Detectors: yes      Blood Transfusions:  yes, prior to 1987, and hysterectomy.    Current Medications (verified): 1)  Vitamin D3 1000 Unit  Tabs (Cholecalciferol) 2)  Crestor 10 Mg Tabs (Rosuvastatin Calcium) .Marland Kitchen.. 1 By Mouth Once Daily 3)  Lisinopril 20 Mg Tabs (Lisinopril) .Marland Kitchen.. 1 By Mouth Once Daily  Allergies (verified): 1)  ! Penicillin 2)  ! Sulfa  Past History:  Past medical, surgical, family and social histories (including risk factors) reviewed, and no changes noted (except as noted below).  Past Medical History: Reviewed history from 06/12/2008 and no changes required. g2p2 Blood transfusion  vaginal bleeding anemia years ago G2P2   Consults None tafeen Derm    hx of basal cell.    Past Surgical History: Reviewed history from 04/03/2008 and no changes required. 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  Family History Thyroid disease- Sister Hyper and Sister Hypo Family History Hypertension- Mom and Sister Family History of Arthritis- Whole Family Family History High cholesterol mom and sister   Father 30 MI  prob had HT no DM     Mom had angioplasty 22  age.  2    Social History: Reviewed history from 06/12/2008 and no changes required. Former Smoker  no ets   Alcohol use-no Drug use-no Regular exercise-yes hhof 2  pet dog     Good sleep Caffeine use/day:  8 + Seat Belt Use:  yes Dental Care w/in 6 mos.:  yes Sun Exposure-Excessive:  no Blood Transfusions:  yes, prior to 1987, hysterectomy   Review of Systems  The patient denies anorexia, fever, weight loss, weight gain, decreased hearing, chest pain, syncope, dyspnea on exertion, peripheral edema, prolonged cough, headaches, abdominal pain, melena, hematochezia, severe indigestion/heartburn, muscle weakness, transient blindness, difficulty walking, unusual weight change, abnormal bleeding, enlarged lymph nodes, angioedema, and breast masses.         vision  change   getting  checked out no disease. Physical Exam General Appearance: well developed, well nourished, no acute distress Eyes: conjunctiva and lids normal, PERRLA, EOMI, WNL Ears, Nose, Mouth, Throat: TM clear, nares clear, oral exam WNL Neck: supple, no lymphadenopathy, no thyromegaly, no JVD Respiratory: clear to auscultation and percussion, respiratory effort normal Cardiovascular: regular rate and rhythm, S1-S2, no murmur, rub or gallop, no bruits, peripheral pulses normal and symmetric, no cyanosis, clubbing, edema or  Chest: no scars, masses, tenderness; no asymmetry, skin changes, nipple discharge   Gastrointestinal: soft, non-tender; no hepatosplenomegaly, masses; active bowel sounds all quadrants,  Lymphatic: no cervical, axillary or inguinal adenopathy Musculoskeletal: gait normal, muscle tone and strength WNL, no joint swelling, effusions, discoloration, crepitus  some vv  Skin: clear, good turgor, color WNL, no rashes, lesions, or ulcerations Neurologic: normal mental status, normal reflexes, normal strength, sensation, and motion Psychiatric: alert; oriented to person, place and time Other Exam:  see labs     Impression & Recommendations:  Problem # 1:  PREVENTIVE HEALTH CARE (ICD-V70.0) Discussed nutrition,exercise,diet,healthy weight, vitamin D and calcium.    needs coloonoscopy Orders: Gastroenterology Referral (GI)  Problem # 2:  HYPERTENSION (ICD-401.9) Assessment: Deteriorated intensify treatment and lifestyle intervention .  disc rare possiblity of sulfa sensitiviy se . The following medications were removed from the medication list:    Zestril 10 Mg Tabs (Lisinopril) .Marland Kitchen... Take 1-2  by mouth once daily as directed for high blood pressure    Lisinopril 20 Mg Tabs (Lisinopril) .Marland Kitchen... 1 by mouth once daily Her updated medication list for this problem includes:    Lisinopril-hydrochlorothiazide 20-12.5 Mg Tabs (Lisinopril-hydrochlorothiazide) .Marland Kitchen... 1 by mouth  once daily  BP today: 140/80 Prior BP: 120/84 (09/11/2008)  Prior 10 Yr Risk Heart Disease: 8 % (09/11/2008)  Labs Reviewed: K+: 4.9 (06/02/2009) Creat: : 1.1 (06/02/2009)   Chol: 156 (06/02/2009)   HDL: 42.50 (06/02/2009)   LDL: 87 (06/02/2009)   TG: 134.0 (06/02/2009)  Problem # 3:  HYPERLIPIDEMIA (ICD-272.4)  Her updated medication list for this problem includes:    Crestor 10 Mg Tabs (Rosuvastatin calcium) .Marland Kitchen... 1 by mouth once daily  Labs Reviewed: SGOT: 23 (06/02/2009)   SGPT: 19 (06/02/2009)  Prior 10 Yr Risk Heart Disease: 8 % (09/11/2008)   HDL:42.50 (06/02/2009), 43.9 (06/05/2008)  LDL:87 (06/02/2009), 97 (06/05/2008)  Chol:156 (06/02/2009), 151 (06/05/2008)  Trig:134.0 (06/02/2009), 51 (06/05/2008)  Problem # 4:  URINALYSIS, ABNORMAL (ICD-791.9) repeat  2+ blood again.     no     symptom .  Complete Medication List: 1)  Vitamin D3 1000 Unit Tabs (Cholecalciferol) 2)  Crestor 10 Mg Tabs (Rosuvastatin calcium) .Marland Kitchen.. 1 by mouth once daily 3)  Lisinopril-hydrochlorothiazide 20-12.5 Mg Tabs (Lisinopril-hydrochlorothiazide) .Marland Kitchen.. 1 by mouth once daily  Other Orders: Admin 1st Vaccine ([address]) Flu Vaccine 56yrs + ([address])  Patient Instructions: 1)  change   blood pressure medication and decrease salt in your diet and increase exercise . 2)  Continue to monitor your blood pressure.  3)  Will contact you about colonoscopy.  4)  ROV    in 3 months or as needed.  Prescriptions: CRESTOR 10 MG TABS (ROSUVASTATIN CALCIUM) 1 by mouth once daily  #30 x 12   Entered by:   Romualdo Bolk, CMA (AAMA)   Authorized by:   Madelin Headings MD   Signed by:   Romualdo Bolk, CMA (AAMA) on 06/09/2009   Method used:   Print then Give to Patient   RxID:   [id number] LISINOPRIL-HYDROCHLOROTHIAZIDE 20-12.5 MG TABS (LISINOPRIL-HYDROCHLOROTHIAZIDE) 1 by mouth once daily  #90 x 1   Entered and Authorized by:   Madelin Headings MD   Signed by:   Madelin Headings MD on 06/09/2009    Method used:   Print then Give to Patient   RxID:   [id number]   Laboratory Results   Urine Tests    Routine Urinalysis   Color: yellow Appearance: Clear Glucose: negative   (Normal Range: Negative) Bilirubin: negative   (Normal Range: Negative) Ketone: negative   (Normal Range: Negative) Spec. Gravity: 1.020   (Normal Range: 1.003-1.035) Blood: moderate   (Normal Range: Negative) pH: 5.5   (Normal Range: 5.0-8.0) Protein: negative   (Normal Range: Negative) Urobilinogen: 1.0   (Normal Range: 0-1) Nitrite: negative   (Normal Range: Negative) Leukocyte Esterace: negative   (Normal Range: Negative)    Comments: Romualdo Bolk, CMA (AAMA)  June 09, 2009 10:24 AM    Flu Vaccine Consent Questions     Do you have a history of severe allergic reactions to this vaccine? no    Any prior history of allergic reactions to egg and/or gelatin? no    Do you have a sensitivity to the preservative Thimersol? no    Do you have a past history of Guillan-Barre Syndrome? no    Do you currently have an acute febrile illness? no    Have you ever had a severe reaction to latex? no    Vaccine information given and explained to patient? yes    Are you currently pregnant? no    Lot Number:[id number]   Exp Date:11/13/2009   Site Given  Lef.t Deltoid IM   Comments: Romualdo Bolk, CMA (AAMA)  June 09, 2009 10:24 AM     .lbflu

## 2010-10-02 NOTE — Consult Note (Signed)
[facility]  Patient:    Kendra Mcfarland, Kendra Mcfarland Visit Number: [id number] MRN: [id number]          Service Type: EMS Location: [place] Attending Physician:  Doug Sou Dictated by:   Luis Abed, M.D. [facility] Proc. Date: 08/22/01 Admit Date:  08/22/2001 Discharge Date: 08/22/2001   CC:         Dorcas Mcmurray, M.D., [facility], [address] [place], [place]                          Consultation Report  EMERGENCY ROOM  REASON FOR CONSULTATION: Lynnmarie Lovett is a 60 year old female, who has had some arm discomfort.  She felt some very slight heaviness in her chest.  She went to the [facility] today.  She was seen by Dr. Daryel November, and he was not convinced this was discharged.  However, because of significant risk factors and the overall symptomatology, he asked that I evaluate her in the emergency room at [facility].  HISTORY OF PRESENT ILLNESS: When she arrived she was quite stable.  She again repeated that she had had some slight arm discomfort.  She did admit that she felt anxiety about all of this and that she had been encouraged to come to the emergency room, and overall she was feeling well.  There is a significant family history of coronary disease.  ALLERGIES:  1. PENICILLIN.  2. SULFA.  MEDICATIONS: Aspirin, two daily.  OTHER MEDICAL PROBLEMS: See the list below.  SOCIAL HISTORY: The patient is married.  FAMILY HISTORY: There is a strong family history of coronary disease.  REVIEW OF SYSTEMS: The patient is having no fever or chills.  She is having no major eye changes.  There are no major ENT problems.  She did have very slight chest heaviness, as mentioned.  There is no cough or shortness of breath.  She has some mild heartburn and nausea.  She has no GU or GYN symptoms.  She has some MUSCULOSKELETAL stiffness.  She has no gross NEUROLOGIC problems.  PHYSICAL  EXAMINATION:  VITAL SIGNS: Blood pressure was 150/80 when [facility] evaluated at [facility]. HEENT: On my examination, stable.  LUNGS: Clear.  NECK: Normal.  CARDIAC: S1 with S2 but no clicks of significant murmurs.  ABDOMEN: Benign.  GU/RECTAL: Deferred.  NEUROLOGIC: Grossly intact.  EXTREMITIES: No peripheral edema.  LABORATORY DATA: The EKG sent to [place] was completely normal.  Bloods that were sent here included a CBC that was normal, with a BMET that is normal and CPKs and troponin that are all normal.  PROBLEMS:  1. Smoking history.  2. Family history of coronary disease (father had myocardial infarction at     age 73).  3. ALLERGIES to:     a. PENICILLIN.     b. SULFA.  4. Some gastrointestinal symptoms by history. *5. Slight arm heaviness and vague nausea, and some chest discomfort at the     time of palpation.  RECOMMENDATIONS:  1. At this point there is no definite proof of cardiac issues.  However, she     has significant risk factors.  On this basis she will be allowed to go     home from the emergency room at 1 a.m. this morning and she will rest.  2. She will come back our office for an outpatient Cardiolite later today,  August 23, 2001, and then I will see her for cardiac follow-up in the future     to follow up these results unless they are abnormal at this point.  3. She will need risk factors modification.  4. She will continue her aspirin. Dictated by:   Luis Abed, M.D. [facility] Attending Physician:  Doug Sou DD:  08/23/01 TD:  08/23/01 Job: [id number]

## 2011-06-02 ENCOUNTER — Encounter: Payer: Self-pay | Admitting: Internal Medicine

## 2011-06-09 ENCOUNTER — Other Ambulatory Visit (INDEPENDENT_AMBULATORY_CARE_PROVIDER_SITE_OTHER): Payer: BC Managed Care – PPO

## 2011-06-09 DIAGNOSIS — Z0000 Encounter for general adult medical examination without abnormal findings: Secondary | ICD-10-CM

## 2011-06-09 LAB — CBC WITH DIFFERENTIAL/PLATELET
Basophils Absolute: 0 10*3/uL (ref 0.0–0.1)
Eosinophils Relative: 4.1 % (ref 0.0–5.0)
Hemoglobin: 13 g/dL (ref 12.0–15.0)
Lymphocytes Relative: 23.9 % (ref 12.0–46.0)
Monocytes Relative: 6.2 % (ref 3.0–12.0)
Neutro Abs: 5.6 10*3/uL (ref 1.4–7.7)
RDW: 14.4 % (ref 11.5–14.6)
WBC: 8.5 10*3/uL (ref 4.5–10.5)

## 2011-06-09 LAB — LIPID PANEL
HDL: 48.1 mg/dL (ref >39.00)
Total CHOL/HDL Ratio: 3
VLDL: 22 mg/dL (ref 0.0–40.0)

## 2011-06-09 LAB — HEPATIC FUNCTION PANEL
AST: 15 U/L (ref 0–37)
Alkaline Phosphatase: 61 U/L (ref 39–117)
Total Bilirubin: 0.6 mg/dL (ref 0.3–1.2)

## 2011-06-09 LAB — BASIC METABOLIC PANEL
Calcium: 8.9 mg/dL (ref 8.4–10.5)
GFR: 59.16 mL/min — ABNORMAL LOW (ref >60.00)
Glucose, Bld: 103 mg/dL — ABNORMAL HIGH (ref 70–99)
Sodium: 140 mEq/L (ref 135–145)

## 2011-06-09 LAB — POCT URINALYSIS DIPSTICK
Nitrite, UA: NEGATIVE
Protein, UA: NEGATIVE
Urobilinogen, UA: 0.2
pH, UA: 5

## 2011-06-16 ENCOUNTER — Ambulatory Visit (INDEPENDENT_AMBULATORY_CARE_PROVIDER_SITE_OTHER): Payer: BC Managed Care – PPO | Admitting: Internal Medicine

## 2011-06-16 ENCOUNTER — Encounter: Payer: Self-pay | Admitting: Internal Medicine

## 2011-06-16 VITALS — BP 128/80 | HR 88 | Ht 59.0 in | Wt 165.0 lb

## 2011-06-16 DIAGNOSIS — Z0000 Encounter for general adult medical examination without abnormal findings: Secondary | ICD-10-CM

## 2011-06-16 DIAGNOSIS — R82998 Other abnormal findings in urine: Secondary | ICD-10-CM

## 2011-06-16 DIAGNOSIS — Z9189 Other specified personal risk factors, not elsewhere classified: Secondary | ICD-10-CM

## 2011-06-16 DIAGNOSIS — E785 Hyperlipidemia, unspecified: Secondary | ICD-10-CM

## 2011-06-16 DIAGNOSIS — I10 Essential (primary) hypertension: Secondary | ICD-10-CM

## 2011-06-16 DIAGNOSIS — Z9289 Personal history of other medical treatment: Secondary | ICD-10-CM

## 2011-06-16 DIAGNOSIS — E669 Obesity, unspecified: Secondary | ICD-10-CM

## 2011-06-16 DIAGNOSIS — Z23 Encounter for immunization: Secondary | ICD-10-CM

## 2011-06-16 LAB — POCT URINALYSIS DIPSTICK
Bilirubin, UA: NEGATIVE
Ketones, UA: NEGATIVE
Leukocytes, UA: NEGATIVE
Nitrite, UA: NEGATIVE
Protein, UA: NEGATIVE
pH, UA: 8

## 2011-06-16 MED ORDER — LISINOPRIL-HYDROCHLOROTHIAZIDE 20-12.5 MG PO TABS: 1.0000 | ORAL_TABLET | Freq: Every day | ORAL | Status: DC

## 2011-06-16 MED ORDER — ROSUVASTATIN CALCIUM 10 MG PO TABS: 10.0000 mg | ORAL_TABLET | Freq: Every day | ORAL | Status: DC

## 2011-06-16 NOTE — Patient Instructions (Addendum)
Intensify lifestyle interventions. Lower sodium  In diet  Increase exercise  Weight loss will help your medical issues.  BP goal is below 140/90 If doing well then cpx in 1 year with labs  Continue same meds   Can come for shingles vaccine  When you wish  Will arrange  So you can get  Microscopic urinalysis at the [place] office.

## 2011-06-16 NOTE — Progress Notes (Signed)
Subjective:    Patient ID: Kendra Mcfarland, female    DOB: 1950/06/27, 61 y.o.   MRN: [id number]  HPI Patient comes in today for preventive visit and follow-up of medical issues. Update of her history since her last visit.  LIPID: Ran out of crestor this past month.  No se otherwise  HT doing ok. No meds  Recent exercise.  Sleep ok.  No injuries or  Major change in heatlh  Review of Systems ROS:  GEN/ HEENTNo fever, significant weight changes sweats headaches vision problems hearing changes, CV/ PULM; No chest pain shortness of breath cough, syncope,edema  change in exercise tolerance. GI /GU: No adominal pain, vomiting, change in bowel habits. No blood in the stool. No significant GU symptoms. SKIN/HEME: ,no acute skin rashes suspicious lesions or bleeding. No lymphadenopathy, nodules, masses.  NEURO/ PSYCH:  No neurologic signs such as weakness numbness No depression anxiety. IMM/ Allergy: No unusual infections.  Allergy .   REST of 12 system review negative Past Medical History  Diagnosis Date  . Anemia     years ago  . Transfusion history     History   Social History  . Marital Status: Married    Spouse Name: N/A    Number of Children: N/A  . Years of Education: N/A   Occupational History  . Not on file.   Social History Main Topics  . Smoking status: Former Games developer  . Smokeless tobacco: Not on file  . Alcohol Use: No  . Drug Use: No  . Sexually Active:    Other Topics Concern  . Not on file   Social History Narrative   No etsRegular exercise- yesHH of 1Pet dogGood sleepG2P2    Past Surgical History  Procedure Date  . Hernia repair     inguinal   . Cesarean section     x 2  . Appendectomy   . Abdominal hysterectomy     bleeding fibroid tumors has ovaries  . Vein protrusion in head as a child   . Blood transfusion from vaginal bleeding     Family History  Problem Relation Age of Onset  . Hypertension Mother   . Arthritis Mother   .  Hyperlipidemia Mother   . Memory loss Mother 80    short term  . Other Mother 65    angioplasty  . Arthritis Father   . Hypertension Father   . Heart attack Father   . Hyperthyroidism Sister   . Hypertension Sister   . Hyperlipidemia Sister   . Hypothyroidism Sister     Allergies  Allergen Reactions  . Naproxen Sodium     REACTION: rash/blisters  . Penicillins   . Sulfonamide Derivatives     REACTION: rash/blisters/ dypsnea    Current Outpatient Prescriptions on File Prior to Visit  Medication Sig Dispense Refill  . cholecalciferol (VITAMIN D) 1000 UNITS tablet Take 1,000 Units by mouth daily.      Marland Kitchen lisinopril-hydrochlorothiazide (PRINZIDE,ZESTORETIC) 20-12.5 MG per tablet Take 1 tablet by mouth daily.      . rosuvastatin (CRESTOR) 10 MG tablet Take 10 mg by mouth daily.        BP 140/80  Pulse 88  Ht 4\' 11"  (1.499 m)  Wt 165 lb (74.844 kg)  BMI 33.33 kg/m2        Objective:   Physical Exam Physical Exam: Vital signs reviewed UXL:KGMW is a well-developed well-nourished alert cooperative  white female who appears her stated age in no acute distress.  HEENT: normocephalic atraumatic , Eyes: PERRL EOM's full, conjunctiva clear, Nares: paten,t no deformity discharge or tenderness., Ears: no deformity EAC's clear TMs with normal landmarks. Mouth: clear OP, no lesions, edema.  Moist mucous membranes. Dentition in adequate repair. NECK: supple without masses, thyromegaly or bruits. CHEST/PULM:  Clear to auscultation and percussion breath sounds equal no wheeze , rales or rhonchi. No chest wall deformities or tenderness. CV: PMI is nondisplaced, S1 S2 no gallops, murmurs, rubs. Peripheral pulses are full without delay.No JVD . Repeat BP right 128/80 reg cuff Breast: normal by inspection . No dimpling, discharge, masses, tenderness or discharge .  ABDOMEN: Bowel sounds normal nontender  No guard or rebound, no hepato splenomegal no CVA tenderness.  No hernia. Extremtities:   No clubbing cyanosis or edema, no acute joint swelling or redness no focal atrophy NEURO:  Oriented x3, cranial nerves 3-12 appear to be intact, no obvious focal weakness,gait within normal limits no abnormal reflexes or asymmetrical SKIN: No acute rashes normal turgor, color, no bruising or petechiae. PSYCH: Oriented, good eye contact, no obvious depression anxiety, cognition and judgment appear normal. LN: no cervical axillary inguinal adenopathy Lab Results  Component Value Date   WBC 8.5 06/09/2011   HGB 13.0 06/09/2011   HCT 39.2 06/09/2011   PLT 280.0 06/09/2011   GLUCOSE 103* 06/09/2011   CHOL 164 06/09/2011   TRIG 110.0 06/09/2011   HDL 48.10 06/09/2011   LDLDIRECT 214.5 03/28/2008   LDLCALC 94 06/09/2011   ALT 13 06/09/2011   AST 15 06/09/2011   NA 140 06/09/2011   K 4.5 06/09/2011   CL 104 06/09/2011   CREATININE 1.0 06/09/2011   BUN 22 06/09/2011   CO2 28 06/09/2011   TSH 1.43 06/09/2011         Assessment & Plan:  Preventive Health Care Counseled regarding healthy nutrition, exercise, sleep, injury prevention, calcium vit d and healthy weight . Ht  elevation some wc phenom  Monitor for control  Repeat 128/78 LIPIDS   Good readings  On crestor Obesity  Counseled.   UA pos DS for blood   Has had this before with neg repeat .   No sx and no forss hematuria. No stones or gu disease known

## 2011-06-18 ENCOUNTER — Other Ambulatory Visit (INDEPENDENT_AMBULATORY_CARE_PROVIDER_SITE_OTHER): Payer: BC Managed Care – PPO

## 2011-06-18 DIAGNOSIS — R82998 Other abnormal findings in urine: Secondary | ICD-10-CM

## 2011-06-18 LAB — URINALYSIS, ROUTINE W REFLEX MICROSCOPIC
Bilirubin Urine: NEGATIVE
Ketones, ur: NEGATIVE
Nitrite: NEGATIVE
Specific Gravity, Urine: <=1.005 (ref 1.000–1.030)
Urobilinogen, UA: 0.2 (ref 0.0–1.0)
pH: 6 (ref 5.0–8.0)

## 2011-06-24 NOTE — Progress Notes (Signed)
Quick Note:  Spoke to Clydie Braun at [facility]- if they were broken down then they won't show up on the count but will show up on the dip. ______

## 2011-07-01 ENCOUNTER — Other Ambulatory Visit: Payer: Self-pay | Admitting: Internal Medicine

## 2011-07-01 DIAGNOSIS — R8290 Unspecified abnormal findings in urine: Secondary | ICD-10-CM

## 2011-07-01 NOTE — Progress Notes (Signed)
Quick Note:  Pt aware of results. ______ 

## 2011-07-13 ENCOUNTER — Other Ambulatory Visit (INDEPENDENT_AMBULATORY_CARE_PROVIDER_SITE_OTHER): Payer: BC Managed Care – PPO

## 2011-07-13 DIAGNOSIS — R8290 Unspecified abnormal findings in urine: Secondary | ICD-10-CM

## 2011-07-13 DIAGNOSIS — R82998 Other abnormal findings in urine: Secondary | ICD-10-CM

## 2011-07-13 LAB — URINALYSIS, ROUTINE W REFLEX MICROSCOPIC
Bilirubin Urine: NEGATIVE
Ketones, ur: NEGATIVE
Total Protein, Urine: NEGATIVE
pH: 6 (ref 5.0–8.0)

## 2011-08-04 NOTE — Progress Notes (Signed)
Quick Note:  Left message to call back. ______ 

## 2011-08-06 NOTE — Progress Notes (Signed)
Quick Note:  Pt aware of lab results. ______ 

## 2011-09-29 ENCOUNTER — Telehealth: Payer: Self-pay

## 2011-09-29 NOTE — Telephone Encounter (Signed)
Pt called and stated that pt has been having pain in her right lower leg and it is very uncomfortable to walk.  Pt states this is a new problem.  Pt would like to be worked in on Thursday or Friday.  Pls advise.

## 2011-09-30 ENCOUNTER — Encounter (INDEPENDENT_AMBULATORY_CARE_PROVIDER_SITE_OTHER): Payer: BC Managed Care – PPO

## 2011-09-30 ENCOUNTER — Ambulatory Visit (INDEPENDENT_AMBULATORY_CARE_PROVIDER_SITE_OTHER): Payer: BC Managed Care – PPO | Admitting: Internal Medicine

## 2011-09-30 ENCOUNTER — Encounter: Payer: Self-pay | Admitting: Internal Medicine

## 2011-09-30 ENCOUNTER — Other Ambulatory Visit: Payer: Self-pay | Admitting: Cardiology

## 2011-09-30 VITALS — BP 138/74 | HR 70 | Temp 98.7°F | Wt 169.0 lb

## 2011-09-30 DIAGNOSIS — M79609 Pain in unspecified limb: Secondary | ICD-10-CM

## 2011-09-30 DIAGNOSIS — R52 Pain, unspecified: Secondary | ICD-10-CM

## 2011-09-30 DIAGNOSIS — M79604 Pain in right leg: Secondary | ICD-10-CM | POA: Insufficient documentation

## 2011-09-30 NOTE — Patient Instructions (Addendum)
For now ibuprofen 800 mg  3 x  Per day.  Get doppler to rule out a venous clot. We should contact you about these results  This could be a pinched nerve in your back.  If doppler is negative then we may need to add prednisone to decrease inflammation and pain and get back x ray and refer to specialist.

## 2011-09-30 NOTE — Progress Notes (Signed)
  Subjective:    Patient ID: Kendra Mcfarland, female    DOB: Sep 15, 1950, 61 y.o.   MRN: [id number]  HPI  Patient comes in today for SDA for  new problem evaluation. Has had sx of right lateral leg pain ? Shooting  For 2 weeks and continuing. Has used  Ice elevation. No falling injury  .  Better with rest and worse after walking   grocery shopping etc. Interfering with activity.  No fever uti sx  Swelling  Or rash.  Can be severe where needs to stop walking.  Has had some buttock pain on right at times . Remote hx of urgent care eval for back pain and said could have spondylolisthesis  but unsure if this was accurate.  Got better . No chronic back pain. No h x of DVT cp sob.   Taking  advil 9 per day not helping.   Review of Systems Neg feer falling cp sob bruising bleeding. Cough  Past history family history social history reviewed in the electronic medical record. No hx of slotting Outpatient Encounter Prescriptions as of 09/30/2011  Medication Sig Dispense Refill  . cholecalciferol (VITAMIN D) 1000 UNITS tablet Take 1,000 Units by mouth daily.      Marland Kitchen lisinopril-hydrochlorothiazide (PRINZIDE,ZESTORETIC) 20-12.5 MG per tablet Take 1 tablet by mouth daily.  30 tablet  11  . rosuvastatin (CRESTOR) 10 MG tablet Take 1 tablet (10 mg total) by mouth daily.  30 tablet  11       Objective:   Physical Exam BP 138/74  Pulse 70  Temp(Src) 98.7 F (37.1 C) (Oral)  Wt 169 lb (76.658 kg)  SpO2 98% wdwn in nad Neck: Supple without adenopathy or masses or bruits Back:  No point tenderness  Points to right buttock as area of poss  Pain at times. RLE no edema VV no redness pain lateral calf but no rash neg homans Neg slr  NEURO: oriented x 3 CN 3-12 appear intact. No focal muscle weakness or atrophy. DTRs symmetrical. Gait WNL.  Grossly non focal. No tremor or abnormal movement.     Assessment & Plan:  New onset right leg pain with activity R/O dvt  asap and if neg evaluate for referred from  back .  No motor changes . Take ibu 800 tid consider back x ray and pred if needed. Consider  Referral as appropriate. A bit atypical as better with rest sit  and worse with walking and sitting not worse.   ? Hx of spondylolisthesis  May get xray back if doppler neg

## 2011-09-30 NOTE — Telephone Encounter (Signed)
Put her on todays schedule .

## 2011-09-30 NOTE — Telephone Encounter (Signed)
Called and spoke with pt and pt is aware of appt on 09/30/11 at 9:45.

## 2011-10-01 ENCOUNTER — Telehealth: Payer: Self-pay

## 2011-10-01 NOTE — Telephone Encounter (Signed)
Called and spoke with pt and pt is aware of doppler results.  Pt is aware of Dr. Rosezella [place] recommendations and will think about this and call back.

## 2011-10-01 NOTE — Telephone Encounter (Signed)
Pt called and states she is aware her doppler was negative.  Pt states she is still feeling the same and would like to know what her next step is. Pls advise.

## 2011-10-01 NOTE — Progress Notes (Signed)
Quick Note:  Spoke with pt and pt is aware of results. Pt states she will think about this and give a call back. ______

## 2011-10-05 ENCOUNTER — Telehealth: Payer: Self-pay

## 2011-10-05 DIAGNOSIS — M79604 Pain in right leg: Secondary | ICD-10-CM

## 2011-10-05 NOTE — Telephone Encounter (Signed)
Pt would like to start on prednisone as soon as possible.  Lumbar spine ordered.  Pls advise if you would feel comfortable ordering medication.

## 2011-10-05 NOTE — Telephone Encounter (Signed)
Pt called and states she would like to have the back x-ray done and to start on the low dose prednisone.  Pt's pharmacy is [facility] on [address].

## 2011-10-06 ENCOUNTER — Ambulatory Visit (INDEPENDENT_AMBULATORY_CARE_PROVIDER_SITE_OTHER)
Admission: RE | Admit: 2011-10-06 | Discharge: 2011-10-06 | Disposition: A | Discharge status: Home / Self Care | Payer: BC Managed Care – PPO | Source: Ambulatory Visit | Attending: Internal Medicine | Admitting: Internal Medicine

## 2011-10-06 DIAGNOSIS — M79604 Pain in right leg: Secondary | ICD-10-CM

## 2011-10-06 DIAGNOSIS — M79609 Pain in unspecified limb: Secondary | ICD-10-CM

## 2011-10-06 NOTE — Telephone Encounter (Signed)
I am not comfortable with giving her steroids. Ask Dr. Fabian Sharp on Friday

## 2011-10-06 NOTE — Telephone Encounter (Signed)
Called and spoke with pt and made aware that a message would be sent to Dr. Fabian Sharp making aware that pt would like to start prednisone.  Pt is aware PCP returns on 10/08/11.

## 2011-10-07 NOTE — Telephone Encounter (Signed)
If she has no fever Then have her obtain   LS x ray  . DX leg pain hx os poss spondylolisthesis    I f ok then we can send in prednisone treatment. However if fever or other new sx then we may need reevaluation

## 2011-10-08 ENCOUNTER — Other Ambulatory Visit: Payer: Self-pay | Admitting: Internal Medicine

## 2011-10-08 DIAGNOSIS — M4310 Spondylolisthesis, site unspecified: Secondary | ICD-10-CM

## 2011-10-08 DIAGNOSIS — M419 Scoliosis, unspecified: Secondary | ICD-10-CM

## 2011-10-08 DIAGNOSIS — M79604 Pain in right leg: Secondary | ICD-10-CM

## 2011-10-08 MED ORDER — PREDNISONE 10 MG PO TABS: 10.0000 mg | ORAL_TABLET | Freq: Every day | ORAL | Status: DC

## 2011-10-08 NOTE — Telephone Encounter (Signed)
Called and spoke with pt and pt is aware of x-ray results and that medication had been sent to the pharmacy.

## 2011-10-08 NOTE — Telephone Encounter (Signed)
Called and spoke with pt.  Pt denies fever and states she does not have any new symptoms.  Pt had lumbar spine done and the results are ready for review.  Pls advise.

## 2011-10-08 NOTE — Progress Notes (Signed)
Quick Note:  Called and spoke with pt and pt is aware of results. Pt would like to have referral done. ______

## 2011-10-08 NOTE — Telephone Encounter (Signed)
See result note for plan  Will send in pred to her pharmacy also

## 2011-11-22 ENCOUNTER — Telehealth: Payer: Self-pay | Admitting: Family Medicine

## 2011-11-22 NOTE — Telephone Encounter (Signed)
Pt informed of all.  She will make an appt in a few weeks if not any better.  Will notify us if on Mobic longer than a few weeks so labs can be monitored.

## 2011-11-22 NOTE — Telephone Encounter (Signed)
This is a patient of [facility].  She recently had a spinal epidural that did not work.  She also recently tried tramadol.  This caused vomiting.  The physician she sees there wanted her to call to make sure that she can take Mobic with all her other medications.  They are looking to avoid any adverse reactions.  Please review her medication list and advise.  Thanks!!!

## 2011-11-22 NOTE — Telephone Encounter (Signed)
Ok to  Use mobic in the short run.  If the reaction to naproxyn is not an allergic reaction. ( document what kind of reaction she had )  Also these meds  can interfere with  Bp medication and kidney function  But if benefit outweighs risk If  it is very helpful can use this  For a few weeks.  If longer than that would monitor renal function  And liver function.  Can make appt if needs to be on longer than a few weeks

## 2011-12-01 ENCOUNTER — Encounter: Payer: Self-pay | Admitting: Internal Medicine

## 2012-01-12 ENCOUNTER — Other Ambulatory Visit: Payer: Self-pay | Admitting: Internal Medicine

## 2012-01-28 ENCOUNTER — Other Ambulatory Visit: Payer: Self-pay | Admitting: Family Medicine

## 2012-01-28 DIAGNOSIS — M549 Dorsalgia, unspecified: Secondary | ICD-10-CM

## 2012-05-03 ENCOUNTER — Other Ambulatory Visit: Payer: Self-pay | Admitting: Internal Medicine

## 2012-05-03 MED ORDER — ROSUVASTATIN CALCIUM 10 MG PO TABS: 10.0000 mg | ORAL_TABLET | Freq: Every day | ORAL | Status: DC

## 2012-06-09 ENCOUNTER — Other Ambulatory Visit (INDEPENDENT_AMBULATORY_CARE_PROVIDER_SITE_OTHER): Payer: BC Managed Care – PPO

## 2012-06-09 DIAGNOSIS — Z0000 Encounter for general adult medical examination without abnormal findings: Secondary | ICD-10-CM

## 2012-06-09 LAB — CBC WITH DIFFERENTIAL/PLATELET
Basophils Absolute: 0.1 10*3/uL (ref 0.0–0.1)
Eosinophils Absolute: 0.3 10*3/uL (ref 0.0–0.7)
HCT: 31.8 % — ABNORMAL LOW (ref 36.0–46.0)
Hemoglobin: 10.2 g/dL — ABNORMAL LOW (ref 12.0–15.0)
Lymphocytes Relative: 20.4 % (ref 12.0–46.0)
Lymphs Abs: 1.8 10*3/uL (ref 0.7–4.0)
MCHC: 32.1 g/dL (ref 30.0–36.0)
Neutro Abs: 5.9 10*3/uL (ref 1.4–7.7)
Platelets: 459 10*3/uL — ABNORMAL HIGH (ref 150.0–400.0)
RDW: 16.3 % — ABNORMAL HIGH (ref 11.5–14.6)

## 2012-06-09 LAB — BASIC METABOLIC PANEL
BUN: 21 mg/dL (ref 6–23)
Chloride: 102 mEq/L (ref 96–112)
GFR: 63.29 mL/min (ref >60.00)
Potassium: 4.2 mEq/L (ref 3.5–5.1)
Sodium: 137 mEq/L (ref 135–145)

## 2012-06-09 LAB — HEPATIC FUNCTION PANEL
ALT: 16 U/L (ref 0–35)
AST: 16 U/L (ref 0–37)
Total Bilirubin: 0.4 mg/dL (ref 0.3–1.2)
Total Protein: 7.4 g/dL (ref 6.0–8.3)

## 2012-06-09 LAB — LIPID PANEL
Cholesterol: 155 mg/dL (ref 0–200)
LDL Cholesterol: 100 mg/dL — ABNORMAL HIGH (ref 0–99)
Triglycerides: 84 mg/dL (ref 0.0–149.0)
VLDL: 16.8 mg/dL (ref 0.0–40.0)

## 2012-06-16 ENCOUNTER — Encounter: Payer: Self-pay | Admitting: Internal Medicine

## 2012-06-16 ENCOUNTER — Ambulatory Visit (INDEPENDENT_AMBULATORY_CARE_PROVIDER_SITE_OTHER): Payer: BC Managed Care – PPO | Admitting: Internal Medicine

## 2012-06-16 VITALS — BP 142/80 | HR 92 | Temp 98.2°F | Resp 18 | Ht 59.0 in | Wt 166.0 lb

## 2012-06-16 DIAGNOSIS — M479 Spondylosis, unspecified: Secondary | ICD-10-CM

## 2012-06-16 DIAGNOSIS — D649 Anemia, unspecified: Secondary | ICD-10-CM | POA: Insufficient documentation

## 2012-06-16 DIAGNOSIS — Z0000 Encounter for general adult medical examination without abnormal findings: Secondary | ICD-10-CM

## 2012-06-16 DIAGNOSIS — I10 Essential (primary) hypertension: Secondary | ICD-10-CM

## 2012-06-16 DIAGNOSIS — E785 Hyperlipidemia, unspecified: Secondary | ICD-10-CM

## 2012-06-16 DIAGNOSIS — Z6833 Body mass index (BMI) 33.0-33.9, adult: Secondary | ICD-10-CM

## 2012-06-16 LAB — CBC WITH DIFFERENTIAL/PLATELET
Eosinophils Absolute: 0.3 10*3/uL (ref 0.0–0.7)
Eosinophils Relative: 2.8 % (ref 0.0–5.0)
Lymphocytes Relative: 20.4 % (ref 12.0–46.0)
MCHC: 32.6 g/dL (ref 30.0–36.0)
MCV: 74.5 fl — ABNORMAL LOW (ref 78.0–100.0)
Monocytes Absolute: 0.6 10*3/uL (ref 0.1–1.0)
Neutrophils Relative %: 69.2 % (ref 43.0–77.0)
Platelets: 482 10*3/uL — ABNORMAL HIGH (ref 150.0–400.0)
RBC: 4.34 Mil/uL (ref 3.87–5.11)
WBC: 9.1 10*3/uL (ref 4.5–10.5)

## 2012-06-16 LAB — POCT URINALYSIS DIPSTICK
Bilirubin, UA: NEGATIVE
Glucose, UA: NEGATIVE
Ketones, UA: NEGATIVE
Spec Grav, UA: 1.015
pH, UA: 6

## 2012-06-16 LAB — IBC PANEL
Iron: 28 ug/dL — ABNORMAL LOW (ref 42–145)
Saturation Ratios: 9.3 % — ABNORMAL LOW (ref 20.0–50.0)
Transferrin: 216.2 mg/dL (ref 212.0–360.0)

## 2012-06-16 MED ORDER — ROSUVASTATIN CALCIUM 10 MG PO TABS: 10.0000 mg | ORAL_TABLET | Freq: Every day | ORAL | Status: DC

## 2012-06-16 MED ORDER — LISINOPRIL-HYDROCHLOROTHIAZIDE 20-12.5 MG PO TABS: 1.0000 | ORAL_TABLET | Freq: Every day | ORAL | Status: DC

## 2012-06-16 NOTE — Patient Instructions (Signed)
Your exam is good.  Increase activity as possible  Uncertain why you are anemic but usually an iron deficiency.  Your stool sample here was negative for blood .   Check repeat labs with iron levels  And plan follow up. Iron  Rich foods and otc iron pill once or twice a day  .  Plan FU in about 1-2 months to see how this is,      We may need to have Dr Jarold Motto  To see you again to  Make sure you dont have some slow loss of blood from your GI tract.  Contact us in the interim if needed Check on shingles vaccine  Coverage.  Preventive Care for Adults, Female A healthy lifestyle and preventive care can promote health and wellness. Preventive health guidelines for women include the following key practices.  A routine yearly physical is a good way to check with your caregiver about your health and preventive screening. It is a chance to share any concerns and updates on your health, and to receive a thorough exam.  Visit your dentist for a routine exam and preventive care every 6 months. Brush your teeth twice a day and floss once a day. Good oral hygiene prevents tooth decay and gum disease.  The frequency of eye exams is based on your age, health, family medical history, use of contact lenses, and other factors. Follow your caregiver's recommendations for frequency of eye exams.  Eat a healthy diet. Foods like vegetables, fruits, whole grains, low-fat dairy products, and lean protein foods contain the nutrients you need without too many calories. Decrease your intake of foods high in solid fats, added sugars, and salt. Eat the right amount of calories for you.Get information about a proper diet from your caregiver, if necessary.  Regular physical exercise is one of the most important things you can do for your health. Most adults should get at least 150 minutes of moderate-intensity exercise (any activity that increases your heart rate and causes you to sweat) each week. In addition, most adults  need muscle-strengthening exercises on 2 or more days a week.  Maintain a healthy weight. The body mass index (BMI) is a screening tool to identify possible weight problems. It provides an estimate of body fat based on height and weight. Your caregiver can help determine your BMI, and can help you achieve or maintain a healthy weight.For adults 20 years and older:  A BMI below 18.5 is considered underweight.  A BMI of 18.5 to 24.9 is normal.  A BMI of 25 to 29.9 is considered overweight.  A BMI of 30 and above is considered obese.  Maintain normal blood lipids and cholesterol levels by exercising and minimizing your intake of saturated fat. Eat a balanced diet with plenty of fruit and vegetables. Blood tests for lipids and cholesterol should begin at age 61 and be repeated every 5 years. If your lipid or cholesterol levels are high, you are over 50, or you are at high risk for heart disease, you may need your cholesterol levels checked more frequently.Ongoing high lipid and cholesterol levels should be treated with medicines if diet and exercise are not effective.  If you smoke, find out from your caregiver how to quit. If you do not use tobacco, do not start.  If you are pregnant, do not drink alcohol. If you are breastfeeding, be very cautious about drinking alcohol. If you are not pregnant and choose to drink alcohol, do not exceed 1 drink per  day. One drink is considered to be 12 ounces (355 mL) of beer, 5 ounces (148 mL) of wine, or 1.5 ounces (44 mL) of liquor.  Avoid use of street drugs. Do not share needles with anyone. Ask for help if you need support or instructions about stopping the use of drugs.  High blood pressure causes heart disease and increases the risk of stroke. Your blood pressure should be checked at least every 1 to 2 years. Ongoing high blood pressure should be treated with medicines if weight loss and exercise are not effective.  If you are 62 to 63 years old, ask  your caregiver if you should take aspirin to prevent strokes.  Diabetes screening involves taking a blood sample to check your fasting blood sugar level. This should be done once every 3 years, after age 49, if you are within normal weight and without risk factors for diabetes. Testing should be considered at a younger age or be carried out more frequently if you are overweight and have at least 1 risk factor for diabetes.  Breast cancer screening is essential preventive care for women. You should practice "breast self-awareness." This means understanding the normal appearance and feel of your breasts and may include breast self-examination. Any changes detected, no matter how small, should be reported to a caregiver. Women in their 84s and 30s should have a clinical breast exam (CBE) by a caregiver as part of a regular health exam every 1 to 3 years. After age 9, women should have a CBE every year. Starting at age 83, women should consider having a mammography (breast X-ray test) every year. Women who have a family history of breast cancer should talk to their caregiver about genetic screening. Women at a high risk of breast cancer should talk to their caregivers about having magnetic resonance imaging (MRI) and a mammography every year.  The Pap test is a screening test for cervical cancer. A Pap test can show cell changes on the cervix that might become cervical cancer if left untreated. A Pap test is a procedure in which cells are obtained and examined from the lower end of the uterus (cervix).  Women should have a Pap test starting at age 2.  Between ages 10 and 62, Pap tests should be repeated every 2 years.  Beginning at age 53, you should have a Pap test every 3 years as long as the past 3 Pap tests have been normal.  Some women have medical problems that increase the chance of getting cervical cancer. Talk to your caregiver about these problems. It is especially important to talk to your  caregiver if a new problem develops soon after your last Pap test. In these cases, your caregiver may recommend more frequent screening and Pap tests.  The above recommendations are the same for women who have or have not gotten the vaccine for human papillomavirus (HPV).  If you had a hysterectomy for a problem that was not cancer or a condition that could lead to cancer, then you no longer need Pap tests. Even if you no longer need a Pap test, a regular exam is a good idea to make sure no other problems are starting.  If you are between ages 28 and 6, and you have had normal Pap tests going back 10 years, you no longer need Pap tests. Even if you no longer need a Pap test, a regular exam is a good idea to make sure no other problems are starting.  If  you have had past treatment for cervical cancer or a condition that could lead to cancer, you need Pap tests and screening for cancer for at least 20 years after your treatment.  If Pap tests have been discontinued, risk factors (such as a new sexual partner) need to be reassessed to determine if screening should be resumed.  The HPV test is an additional test that may be used for cervical cancer screening. The HPV test looks for the virus that can cause the cell changes on the cervix. The cells collected during the Pap test can be tested for HPV. The HPV test could be used to screen women aged 4 years and older, and should be used in women of any age who have unclear Pap test results. After the age of 10, women should have HPV testing at the same frequency as a Pap test.  Colorectal cancer can be detected and often prevented. Most routine colorectal cancer screening begins at the age of 73 and continues through age 24. However, your caregiver may recommend screening at an earlier age if you have risk factors for colon cancer. On a yearly basis, your caregiver may provide home test kits to check for hidden blood in the stool. Use of a small camera at  the end of a tube, to directly examine the colon (sigmoidoscopy or colonoscopy), can detect the earliest forms of colorectal cancer. Talk to your caregiver about this at age 25, when routine screening begins. Direct examination of the colon should be repeated every 5 to 10 years through age 43, unless early forms of pre-cancerous polyps or small growths are found.  Hepatitis C blood testing is recommended for all people born from [birth year] through 1966 and any individual with known risks for hepatitis C.  Practice safe sex. Use condoms and avoid high-risk sexual practices to reduce the spread of sexually transmitted infections (STIs). STIs include gonorrhea, chlamydia, syphilis, trichomonas, herpes, HPV, and human immunodeficiency virus (HIV). Herpes, HIV, and HPV are viral illnesses that have no cure. They can result in disability, cancer, and death. Sexually active women aged 80 and younger should be checked for chlamydia. Older women with new or multiple partners should also be tested for chlamydia. Testing for other STIs is recommended if you are sexually active and at increased risk.  Osteoporosis is a disease in which the bones lose minerals and strength with aging. This can result in serious bone fractures. The risk of osteoporosis can be identified using a bone density scan. Women ages 71 and over and women at risk for fractures or osteoporosis should discuss screening with their caregivers. Ask your caregiver whether you should take a calcium supplement or vitamin D to reduce the rate of osteoporosis.  Menopause can be associated with physical symptoms and risks. Hormone replacement therapy is available to decrease symptoms and risks. You should talk to your caregiver about whether hormone replacement therapy is right for you.  Use sunscreen with sun protection factor (SPF) of 30 or more. Apply sunscreen liberally and repeatedly throughout the day. You should seek shade when your shadow is shorter  than you. Protect yourself by wearing long sleeves, pants, a wide-brimmed hat, and sunglasses year round, whenever you are outdoors.  Once a month, do a whole body skin exam, using a mirror to look at the skin on your back. Notify your caregiver of new moles, moles that have irregular borders, moles that are larger than a pencil eraser, or moles that have changed in shape or  color.  Stay current with required immunizations.  Influenza. You need a dose every fall (or winter). The composition of the flu vaccine changes each year, so being vaccinated once is not enough.  Pneumococcal polysaccharide. You need 1 to 2 doses if you smoke cigarettes or if you have certain chronic medical conditions. You need 1 dose at age 25 (or older) if you have never been vaccinated.  Tetanus, diphtheria, pertussis (Tdap, Td). Get 1 dose of Tdap vaccine if you are younger than age 58, are over 36 and have contact with an infant, are a Research scientist (physical sciences), are pregnant, or simply want to be protected from whooping cough. After that, you need a Td booster dose every 10 years. Consult your caregiver if you have not had at least 3 tetanus and diphtheria-containing shots sometime in your life or have a deep or dirty wound.  HPV. You need this vaccine if you are a woman age 24 or younger. The vaccine is given in 3 doses over 6 months.  Measles, mumps, rubella (MMR). You need at least 1 dose of MMR if you were born in 1957 or later. You may also need a second dose.  Meningococcal. If you are age 36 to 25 and a first-year college student living in a residence hall, or have one of several medical conditions, you need to get vaccinated against meningococcal disease. You may also need additional booster doses.  Zoster (shingles). If you are age 79 or older, you should get this vaccine.  Varicella (chickenpox). If you have never had chickenpox or you were vaccinated but received only 1 dose, talk to your caregiver to find out if  you need this vaccine.  Hepatitis A. You need this vaccine if you have a specific risk factor for hepatitis A virus infection or you simply wish to be protected from this disease. The vaccine is usually given as 2 doses, 6 to 18 months apart.  Hepatitis B. You need this vaccine if you have a specific risk factor for hepatitis B virus infection or you simply wish to be protected from this disease. The vaccine is given in 3 doses, usually over 6 months. Preventive Services / Frequency Ages 65 to 46  Blood pressure check.** / Every 1 to 2 years.  Lipid and cholesterol check.** / Every 5 years beginning at age 50.  Clinical breast exam.** / Every 3 years for women in their 74s and 30s.  Pap test.** / Every 2 years from ages 23 through 57. Every 3 years starting at age 13 through age 83 or 38 with a history of 3 consecutive normal Pap tests.  HPV screening.** / Every 3 years from ages 51 through ages 56 to 54 with a history of 3 consecutive normal Pap tests.  Hepatitis C blood test.** / For any individual with known risks for hepatitis C.  Skin self-exam. / Monthly.  Influenza immunization.** / Every year.  Pneumococcal polysaccharide immunization.** / 1 to 2 doses if you smoke cigarettes or if you have certain chronic medical conditions.  Tetanus, diphtheria, pertussis (Tdap, Td) immunization. / A one-time dose of Tdap vaccine. After that, you need a Td booster dose every 10 years.  HPV immunization. / 3 doses over 6 months, if you are 73 and younger.  Measles, mumps, rubella (MMR) immunization. / You need at least 1 dose of MMR if you were born in 1957 or later. You may also need a second dose.  Meningococcal immunization. / 1 dose if you are  age 20 to 9 and a first-year college student living in a residence hall, or have one of several medical conditions, you need to get vaccinated against meningococcal disease. You may also need additional booster doses.  Varicella immunization.** /  Consult your caregiver.  Hepatitis A immunization.** / Consult your caregiver. 2 doses, 6 to 18 months apart.  Hepatitis B immunization.** / Consult your caregiver. 3 doses usually over 6 months. Ages 73 to 70  Blood pressure check.** / Every 1 to 2 years.  Lipid and cholesterol check.** / Every 5 years beginning at age 37.  Clinical breast exam.** / Every year after age 65.  Mammogram.** / Every year beginning at age 42 and continuing for as long as you are in good health. Consult with your caregiver.  Pap test.** / Every 3 years starting at age 80 through age 19 or 58 with a history of 3 consecutive normal Pap tests.  HPV screening.** / Every 3 years from ages 36 through ages 27 to 12 with a history of 3 consecutive normal Pap tests.  Fecal occult blood test (FOBT) of stool. / Every year beginning at age 87 and continuing until age 75. You may not need to do this test if you get a colonoscopy every 10 years.  Flexible sigmoidoscopy or colonoscopy.** / Every 5 years for a flexible sigmoidoscopy or every 10 years for a colonoscopy beginning at age 56 and continuing until age 92.  Hepatitis C blood test.** / For all people born from [birth year] through 1966 and any individual with known risks for hepatitis C.  Skin self-exam. / Monthly.  Influenza immunization.** / Every year.  Pneumococcal polysaccharide immunization.** / 1 to 2 doses if you smoke cigarettes or if you have certain chronic medical conditions.  Tetanus, diphtheria, pertussis (Tdap, Td) immunization.** / A one-time dose of Tdap vaccine. After that, you need a Td booster dose every 10 years.  Measles, mumps, rubella (MMR) immunization. / You need at least 1 dose of MMR if you were born in 1957 or later. You may also need a second dose.  Varicella immunization.** / Consult your caregiver.  Meningococcal immunization.** / Consult your caregiver.  Hepatitis A immunization.** / Consult your caregiver. 2 doses, 6 to 18 months  apart.  Hepatitis B immunization.** / Consult your caregiver. 3 doses, usually over 6 months. Ages 26 and over  Blood pressure check.** / Every 1 to 2 years.  Lipid and cholesterol check.** / Every 5 years beginning at age 74.  Clinical breast exam.** / Every year after age 7.  Mammogram.** / Every year beginning at age 18 and continuing for as long as you are in good health. Consult with your caregiver.  Pap test.** / Every 3 years starting at age 19 through age 59 or 6 with a 3 consecutive normal Pap tests. Testing can be stopped between 65 and 70 with 3 consecutive normal Pap tests and no abnormal Pap or HPV tests in the past 10 years.  HPV screening.** / Every 3 years from ages 81 through ages 36 or 57 with a history of 3 consecutive normal Pap tests. Testing can be stopped between 65 and 70 with 3 consecutive normal Pap tests and no abnormal Pap or HPV tests in the past 10 years.  Fecal occult blood test (FOBT) of stool. / Every year beginning at age 70 and continuing until age 29. You may not need to do this test if you get a colonoscopy every 10 years.  Flexible  sigmoidoscopy or colonoscopy.** / Every 5 years for a flexible sigmoidoscopy or every 10 years for a colonoscopy beginning at age 33 and continuing until age 71.  Hepatitis C blood test.** / For all people born from [birth year] through 1966 and any individual with known risks for hepatitis C.  Osteoporosis screening.** / A one-time screening for women ages 14 and over and women at risk for fractures or osteoporosis.  Skin self-exam. / Monthly.  Influenza immunization.** / Every year.  Pneumococcal polysaccharide immunization.** / 1 dose at age 38 (or older) if you have never been vaccinated.  Tetanus, diphtheria, pertussis (Tdap, Td) immunization. / A one-time dose of Tdap vaccine if you are over 65 and have contact with an infant, are a Research scientist (physical sciences), or simply want to be protected from whooping cough. After that, you  need a Td booster dose every 10 years.  Varicella immunization.** / Consult your caregiver.  Meningococcal immunization.** / Consult your caregiver.  Hepatitis A immunization.** / Consult your caregiver. 2 doses, 6 to 18 months apart.  Hepatitis B immunization.** / Check with your caregiver. 3 doses, usually over 6 months. ** Family history and personal history of risk and conditions may change your caregiver's recommendations. Document Released: 06/29/2001 Document Revised: 07/26/2011 Document Reviewed: 09/28/2010 [facility] Patient Information 2013 [place], [place].   Anemia, Frequently Asked Questions WHAT ARE THE SYMPTOMS OF ANEMIA?  Headache.  Difficulty thinking.  Fatigue.  Shortness of breath.  Weakness.  Rapid heartbeat. AT WHAT POINT ARE PEOPLE CONSIDERED ANEMIC?  This varies with gender and age.   Both hemoglobin (Hgb) and hematocrit values are used to define anemia. These lab values are obtained from a complete blood count (CBC) test. This is performed at a caregiver's office.  The normal range of hemoglobin values for adult men is 14.0 g/dL to 45.4 g/dL. For nonpregnant women, values are 12.3 g/dL to 09.8 g/dL.  The World Health Organization defines anemia as less than 12 g/dL for nonpregnant women and less than 13 g/dL for men.  For adult males, the average normal hematocrit is 46%, and the range is 40% to 52%.  For adult females, the average normal hematocrit is 41%, and the range is 35% to 47%.  Values that fall below the lower limits can be a sign of anemia and should have further checking (evaluation). GROUPS OF PEOPLE WHO ARE AT RISK FOR DEVELOPING ANEMIA INCLUDE:   Infants who are breastfed or taking a formula that is not fortified with iron.  Children going through a rapid growth spurt. The iron available can not keep up with the needs for a red cell mass which must grow with the child.  Women in childbearing years. They need iron because of blood loss  during menstruation.  Pregnant women. The growing fetus creates a high demand for iron.  People with ongoing gastrointestinal blood loss are at risk of developing iron deficiency.  Individuals with leukemia or cancer who must receive chemotherapy or radiation to treat their disease. The drugs or radiation used to treat these diseases often decreases the bone marrow's ability to make cells of all classes. This includes red blood cells, white blood cells, and platelets.  Individuals with chronic inflammatory conditions such as rheumatoid arthritis or chronic infections.  The elderly. ARE SOME TYPES OF ANEMIA INHERITED?   Yes, some types of anemia are due to inherited or genetic defects.  Sickle cell anemia. This occurs most often in people of African, African American, and Mediterranean descent.  Thalassemia (or  Cooley's anemia). This type is found in people of Mediterranean and Southeast Asian descent. These types of anemia are common.  [place]. This is rare. CAN CERTAIN MEDICATIONS CAUSE A PERSON TO BECOME ANEMIC?  Yes. For example, drugs to fight cancer (chemotherapeutic agents) often cause anemia. These drugs can slow the bone marrow's ability to make red blood cells. If there are not enough red blood cells, the body does not get enough oxygen. WHAT HEMATOCRIT LEVEL IS REQUIRED TO DONATE BLOOD?  The lower limit of an acceptable hematocrit for blood donors is 38%. If you have a low hematocrit value, you should schedule an appointment with your caregiver. ARE BLOOD TRANSFUSIONS COMMONLY USED TO CORRECT ANEMIA, AND ARE THEY DANGEROUS?  They are used to treat anemia as a last resort. Your caregiver will find the cause of the anemia and correct it if possible. Most blood transfusions are given because of excessive bleeding at the time of surgery, with trauma, or because of bone marrow suppression in patients with cancer or leukemia on chemotherapy. Blood transfusions are safer than ever before.  We also know that blood transfusions affect the immune system and may increase certain risks. There is also a concern for human error. In 1/16,000 transfusions, a patient receives a transfusion of blood that is not matched with his or her blood type.  WHAT IS IRON DEFICIENCY ANEMIA AND CAN I CORRECT IT BY CHANGING MY DIET?  Iron is an essential part of hemoglobin. Without enough hemoglobin, anemia develops and the body does not get the right amount of oxygen. Iron deficiency anemia develops after the body has had a low level of iron for a long time. This is either caused by blood loss, not taking in or absorbing enough iron, or increased demands for iron (like pregnancy or rapid growth).  Foods from animal origin such as beef, chicken, and pork, are good sources of iron. Be sure to have one of these foods at each meal. Vitamin C helps your body absorb iron. Foods rich in Vitamin C include citrus, bell pepper, strawberries, spinach and cantaloupe. In some cases, iron supplements may be needed in order to correct the iron deficiency. In the case of poor absorption, extra iron may have to be given directly into the vein through a needle (intravenously). I HAVE BEEN DIAGNOSED WITH IRON DEFICIENCY ANEMIA AND MY CAREGIVER PRESCRIBED IRON SUPPLEMENTS. HOW LONG WILL IT TAKE FOR MY BLOOD TO BECOME NORMAL?  It depends on the degree of anemia at the beginning of treatment. Most people with mild to moderate iron deficiency, anemia will correct the anemia over a period of 2 to 3 months. But after the anemia is corrected, the iron stored by the body is still low. Caregivers often suggest an additional 6 months of oral iron therapy once the anemia has been reversed. This will help prevent the iron deficiency anemia from quickly happening again. Non-anemic adult males should take iron supplements only under the direction of a doctor, too much iron can cause liver damage.  MY HEMOGLOBIN IS 9 G/DL AND I AM SCHEDULED FOR  SURGERY. SHOULD I POSTPONE THE SURGERY?  If you have Hgb of 9, you should discuss this with your caregiver right away. Many patients with similar hemoglobin levels have had surgery without problems. If minimal blood loss is expected for a minor procedure, no treatment may be necessary.  If a greater blood loss is expected for more extensive procedures, you should ask your caregiver about being treated with erythropoietin and  iron. This is to accelerate the recovery of your hemoglobin to a normal level before surgery. An anemic patient who undergoes high-blood-loss surgery has a greater risk of surgical complications and need for a blood transfusion, which also carries some risk.  I HAVE BEEN TOLD THAT HEAVY MENSTRUAL PERIODS CAUSE ANEMIA. IS THERE ANYTHING I CAN DO TO PREVENT THE ANEMIA?  Anemia that results from heavy periods is usually due to iron deficiency. You can try to meet the increased demands for iron caused by the heavy monthly blood loss by increasing the intake of iron-rich foods. Iron supplements may be required. Discuss your concerns with your caregiver. WHAT CAUSES ANEMIA DURING PREGNANCY?  Pregnancy places major demands on the body. The mother must meet the needs of both her body and her growing baby. The body needs enough iron and folate to make the right amount of red blood cells. To prevent anemia while pregnant, the mother should stay in close contact with her caregiver.  Be sure to eat a diet that has foods rich in iron and folate like liver and dark green leafy vegetables. Folate plays an important role in the normal development of a baby's spinal cord. Folate can help prevent serious disorders like spina bifida. If your diet does not provide adequate nutrients, you may want to talk with your caregiver about nutritional supplements.  WHAT IS THE RELATIONSHIP BETWEEN FIBROID TUMORS AND ANEMIA IN WOMEN?  The relationship is usually caused by the increased menstrual blood loss caused by  fibroids. Good iron intake may be required to prevent iron deficiency anemia from developing.  Document Released: 12/10/2003 Document Revised: 07/26/2011 Document Reviewed: 05/26/2010 [facility] Patient Information 2013 [place], [place].

## 2012-06-16 NOTE — Progress Notes (Signed)
Chief Complaint  Patient presents with  . Annual Exam    HPI: Patient comes in today for Preventive Health Care visit  No major change in health status since last visit .   Husband recently had arm surgery.  And she is helper .  Battling back pain and arthritis in PT has seen Dr.  Danielle Dess. Exercise ;has been limited . NO hx of bleeding periods blood in stool blood donation recently surgery  . Did take a lot of advil aleve earlier in year for her back but no abd  Gi sx.  LIPIDS: no se of med reported  ROS:  GEN/ HEENT: No fever, significant weight changes sweats headaches vision problems hearing changes, CV/ PULM; No chest pain shortness of breath cough, syncope,edema  change in exercise tolerance. GI /GU: No adominal pain, vomiting, change in bowel habits. No blood in the stool. No significant GU symptoms. SKIN/HEME: ,no acute skin rashes suspicious lesions or bleeding. No lymphadenopathy, nodules, masses.  NEURO/ PSYCH:  No neurologic signs such as weakness numbness. No depression anxiety. IMM/ Allergy: No unusual infections.  Allergy .   REST of 12 system review negative except as per HPI   Past Medical History  Diagnosis Date  . Anemia     years ago  . Transfusion history     Family History  Problem Relation Age of Onset  . Hypertension Mother   . Arthritis Mother   . Hyperlipidemia Mother   . Memory loss Mother 35    short term  . Other Mother 29    angioplasty  . Arthritis Father   . Hypertension Father   . Heart attack Father   . Hyperthyroidism Sister   . Hypertension Sister   . Hyperlipidemia Sister   . Hypothyroidism Sister     History   Social History  . Marital Status: Married    Spouse Name: N/A    Number of Children: N/A  . Years of Education: N/A   Social History Main Topics  . Smoking status: Former Games developer  . Smokeless tobacco: None  . Alcohol Use: No  . Drug Use: No  . Sexually Active:    Other Topics Concern  . None   Social History  Narrative   No etsRegular exercise- just restarted HH of 2 Pet dog puppy 6 months Good [id number]    Outpatient Encounter Prescriptions as of 06/16/2012  Medication Sig Dispense Refill  . cholecalciferol (VITAMIN D) 1000 UNITS tablet Take 1,000 Units by mouth daily.      . diphenhydramine-acetaminophen (TYLENOL PM) 25-500 MG TABS Take 1 tablet by mouth at bedtime as needed.      Marland Kitchen lisinopril-hydrochlorothiazide (PRINZIDE,ZESTORETIC) 20-12.5 MG per tablet Take 1 tablet by mouth daily.  90 tablet  3  . rosuvastatin (CRESTOR) 10 MG tablet Take 1 tablet (10 mg total) by mouth daily.  90 tablet  3  . [DISCONTINUED] lisinopril-hydrochlorothiazide (PRINZIDE,ZESTORETIC) 20-12.5 MG per tablet Take 1 tablet by mouth daily.  30 tablet  11  . [DISCONTINUED] rosuvastatin (CRESTOR) 10 MG tablet Take 1 tablet (10 mg total) by mouth daily.  30 tablet  1  . [DISCONTINUED] predniSONE (DELTASONE) 10 MG tablet Take 1 tablet (10 mg total) by mouth daily. Take pills per day,6,6,6,4,4,4,2,2,2,1,1,1  40 tablet  0    EXAM:  BP 142/80  Pulse 92  Temp 98.2 F (36.8 C) (Oral)  Resp 18  Ht 4\' 11"  (1.499 m)  Wt 166 lb (75.297 kg)  BMI 33.53 kg/m2  SpO2  98%  Body mass index is 33.53 kg/(m^2).  Physical Exam: Vital signs reviewed YQM:VHQI is a well-developed well-nourished alert cooperative   female who appears her stated age in no acute distress.  HEENT: normocephalic atraumatic , Eyes: PERRL EOM's full, conjunctiva clear, Nares: paten,t no deformity discharge or tenderness., Ears: no deformity EAC's clear TMs with normal landmarks. Mouth: clear OP, no lesions, edema.  Moist mucous membranes. Dentition in adequate repair. NECK: supple without masses, thyromegaly or bruits. CHEST/PULM:  Clear to auscultation and percussion breath sounds equal no wheeze , rales or rhonchi. No chest wall deformities or tenderness. Breast: normal by inspection . No dimpling, discharge, masses, tenderness or discharge . CV: PMI is  nondisplaced, S1 S2 no gallops, murmurs, rubs. Peripheral pulses are full without delay.No JVD .  ABDOMEN: Bowel sounds normal nontender  No guard or rebound, no hepato splenomegal no CVA tenderness.   Extremtities:  No clubbing cyanosis or edema, no acute joint swelling or redness no focal atrophy mild discomfort back when arising from table  Rectal hemorr tags  No masses brown stool  heme negative  NEURO:  Oriented x3, cranial nerves 3-12 appear to be intact, no obvious focal weakness,gait within normal limits no abnormal reflexes or asymmetrical SKIN: No acute rashes normal turgor, color, no bruising or petechiae. PSYCH: Oriented, good eye contact, no obvious depression anxiety, cognition and judgment appear normal. LN: no cervical axillary inguinal adenopathy  Lab Results  Component Value Date   WBC 9.1 06/16/2012   HGB 10.6* 06/16/2012   HCT 32.3* 06/16/2012   PLT 482.0* 06/16/2012   GLUCOSE 106* 06/09/2012   CHOL 155 06/09/2012   TRIG 84.0 06/09/2012   HDL 38.20* 06/09/2012   LDLDIRECT 214.5 03/28/2008   LDLCALC 100* 06/09/2012   ALT 16 06/09/2012   AST 16 06/09/2012   NA 137 06/09/2012   K 4.2 06/09/2012   CL 102 06/09/2012   CREATININE 1.0 06/09/2012   BUN 21 06/09/2012   CO2 28 06/09/2012   TSH 2.57 06/09/2012    ASSESSMENT AND PLAN:  Discussed the following assessment and plan:  1. Visit for preventive health examination  diphenhydramine-acetaminophen (TYLENOL PM) 25-500 MG TABS, CBC with Differential, IBC panel, Ferritin, Vitamin B12, Folate, POCT urinalysis dipstick   will check on zostavax  utd  last colon 2 2011  2. HYPERTENSION  diphenhydramine-acetaminophen (TYLENOL PM) 25-500 MG TABS, CBC with Differential, IBC panel, Ferritin, Vitamin B12, Folate, POCT urinalysis dipstick   borderline elevated nose of med monitor readings at home  3. HYPERLIPIDEMIA  diphenhydramine-acetaminophen (TYLENOL PM) 25-500 MG TABS, CBC with Differential, IBC panel, Ferritin, Vitamin B12, Folate, POCT  urinalysis dipstick  4. Anemia  diphenhydramine-acetaminophen (TYLENOL PM) 25-500 MG TABS, CBC with Differential, IBC panel, Ferritin, Vitamin B12, Folate, POCT urinalysis dipstick   new onset presumed iron defic no ob cause   5. Degenerative joint disease of low back     trying non surgery route tp exercise   6. BMI 33.0-33.9,adult      Patient Care Team: Madelin Headings, MD as PCP - General Janalyn Harder, MD as Attending Physician (Dermatology) Mardella Layman, MD as Attending Physician (Gastroenterology) Barnett Abu, MD as Attending Physician (Neurosurgery) Patient Instructions  Your exam is good.  Increase activity as possible  Uncertain why you are anemic but usually an iron deficiency.  Your stool sample here was negative for blood .   Check repeat labs with iron levels  And plan follow up. Iron  Rich foods and otc  iron pill once or twice a day  .  Plan FU in about 1-2 months to see how this is,      We may need to have Dr Jarold Motto  To see you again to  Make sure you dont have some slow loss of blood from your GI tract.  Contact us in the interim if needed Check on shingles vaccine  Coverage.  Preventive Care for Adults, Female A healthy lifestyle and preventive care can promote health and wellness. Preventive health guidelines for women include the following key practices.  A routine yearly physical is a good way to check with your caregiver about your health and preventive screening. It is a chance to share any concerns and updates on your health, and to receive a thorough exam.  Visit your dentist for a routine exam and preventive care every 6 months. Brush your teeth twice a day and floss once a day. Good oral hygiene prevents tooth decay and gum disease.  The frequency of eye exams is based on your age, health, family medical history, use of contact lenses, and other factors. Follow your caregiver's recommendations for frequency of eye exams.  Eat a healthy diet. Foods  like vegetables, fruits, whole grains, low-fat dairy products, and lean protein foods contain the nutrients you need without too many calories. Decrease your intake of foods high in solid fats, added sugars, and salt. Eat the right amount of calories for you.Get information about a proper diet from your caregiver, if necessary.  Regular physical exercise is one of the most important things you can do for your health. Most adults should get at least 150 minutes of moderate-intensity exercise (any activity that increases your heart rate and causes you to sweat) each week. In addition, most adults need muscle-strengthening exercises on 2 or more days a week.  Maintain a healthy weight. The body mass index (BMI) is a screening tool to identify possible weight problems. It provides an estimate of body fat based on height and weight. Your caregiver can help determine your BMI, and can help you achieve or maintain a healthy weight.For adults 20 years and older:  A BMI below 18.5 is considered underweight.  A BMI of 18.5 to 24.9 is normal.  A BMI of 25 to 29.9 is considered overweight.  A BMI of 30 and above is considered obese.  Maintain normal blood lipids and cholesterol levels by exercising and minimizing your intake of saturated fat. Eat a balanced diet with plenty of fruit and vegetables. Blood tests for lipids and cholesterol should begin at age 81 and be repeated every 5 years. If your lipid or cholesterol levels are high, you are over 50, or you are at high risk for heart disease, you may need your cholesterol levels checked more frequently.Ongoing high lipid and cholesterol levels should be treated with medicines if diet and exercise are not effective.  If you smoke, find out from your caregiver how to quit. If you do not use tobacco, do not start.  If you are pregnant, do not drink alcohol. If you are breastfeeding, be very cautious about drinking alcohol. If you are not pregnant and choose  to drink alcohol, do not exceed 1 drink per day. One drink is considered to be 12 ounces (355 mL) of beer, 5 ounces (148 mL) of wine, or 1.5 ounces (44 mL) of liquor.  Avoid use of street drugs. Do not share needles with anyone. Ask for help if you need support or instructions about  stopping the use of drugs.  High blood pressure causes heart disease and increases the risk of stroke. Your blood pressure should be checked at least every 1 to 2 years. Ongoing high blood pressure should be treated with medicines if weight loss and exercise are not effective.  If you are 20 to 63 years old, ask your caregiver if you should take aspirin to prevent strokes.  Diabetes screening involves taking a blood sample to check your fasting blood sugar level. This should be done once every 3 years, after age 40, if you are within normal weight and without risk factors for diabetes. Testing should be considered at a younger age or be carried out more frequently if you are overweight and have at least 1 risk factor for diabetes.  Breast cancer screening is essential preventive care for women. You should practice "breast self-awareness." This means understanding the normal appearance and feel of your breasts and may include breast self-examination. Any changes detected, no matter how small, should be reported to a caregiver. Women in their 43s and 30s should have a clinical breast exam (CBE) by a caregiver as part of a regular health exam every 1 to 3 years. After age 62, women should have a CBE every year. Starting at age 72, women should consider having a mammography (breast X-ray test) every year. Women who have a family history of breast cancer should talk to their caregiver about genetic screening. Women at a high risk of breast cancer should talk to their caregivers about having magnetic resonance imaging (MRI) and a mammography every year.  The Pap test is a screening test for cervical cancer. A Pap test can show cell  changes on the cervix that might become cervical cancer if left untreated. A Pap test is a procedure in which cells are obtained and examined from the lower end of the uterus (cervix).  Women should have a Pap test starting at age 24.  Between ages 55 and 66, Pap tests should be repeated every 2 years.  Beginning at age 32, you should have a Pap test every 3 years as long as the past 3 Pap tests have been normal.  Some women have medical problems that increase the chance of getting cervical cancer. Talk to your caregiver about these problems. It is especially important to talk to your caregiver if a new problem develops soon after your last Pap test. In these cases, your caregiver may recommend more frequent screening and Pap tests.  The above recommendations are the same for women who have or have not gotten the vaccine for human papillomavirus (HPV).  If you had a hysterectomy for a problem that was not cancer or a condition that could lead to cancer, then you no longer need Pap tests. Even if you no longer need a Pap test, a regular exam is a good idea to make sure no other problems are starting.  If you are between ages 75 and 67, and you have had normal Pap tests going back 10 years, you no longer need Pap tests. Even if you no longer need a Pap test, a regular exam is a good idea to make sure no other problems are starting.  If you have had past treatment for cervical cancer or a condition that could lead to cancer, you need Pap tests and screening for cancer for at least 20 years after your treatment.  If Pap tests have been discontinued, risk factors (such as a new sexual partner) need to  be reassessed to determine if screening should be resumed.  The HPV test is an additional test that may be used for cervical cancer screening. The HPV test looks for the virus that can cause the cell changes on the cervix. The cells collected during the Pap test can be tested for HPV. The HPV test could  be used to screen women aged 54 years and older, and should be used in women of any age who have unclear Pap test results. After the age of 29, women should have HPV testing at the same frequency as a Pap test.  Colorectal cancer can be detected and often prevented. Most routine colorectal cancer screening begins at the age of 42 and continues through age 79. However, your caregiver may recommend screening at an earlier age if you have risk factors for colon cancer. On a yearly basis, your caregiver may provide home test kits to check for hidden blood in the stool. Use of a small camera at the end of a tube, to directly examine the colon (sigmoidoscopy or colonoscopy), can detect the earliest forms of colorectal cancer. Talk to your caregiver about this at age 23, when routine screening begins. Direct examination of the colon should be repeated every 5 to 10 years through age 80, unless early forms of pre-cancerous polyps or small growths are found.  Hepatitis C blood testing is recommended for all people born from [birth year] through 1965 and any individual with known risks for hepatitis C.  Practice safe sex. Use condoms and avoid high-risk sexual practices to reduce the spread of sexually transmitted infections (STIs). STIs include gonorrhea, chlamydia, syphilis, trichomonas, herpes, HPV, and human immunodeficiency virus (HIV). Herpes, HIV, and HPV are viral illnesses that have no cure. They can result in disability, cancer, and death. Sexually active women aged 65 and younger should be checked for chlamydia. Older women with new or multiple partners should also be tested for chlamydia. Testing for other STIs is recommended if you are sexually active and at increased risk.  Osteoporosis is a disease in which the bones lose minerals and strength with aging. This can result in serious bone fractures. The risk of osteoporosis can be identified using a bone density scan. Women ages 62 and over and women at risk for  fractures or osteoporosis should discuss screening with their caregivers. Ask your caregiver whether you should take a calcium supplement or vitamin D to reduce the rate of osteoporosis.  Menopause can be associated with physical symptoms and risks. Hormone replacement therapy is available to decrease symptoms and risks. You should talk to your caregiver about whether hormone replacement therapy is right for you.  Use sunscreen with sun protection factor (SPF) of 30 or more. Apply sunscreen liberally and repeatedly throughout the day. You should seek shade when your shadow is shorter than you. Protect yourself by wearing long sleeves, pants, a wide-brimmed hat, and sunglasses year round, whenever you are outdoors.  Once a month, do a whole body skin exam, using a mirror to look at the skin on your back. Notify your caregiver of new moles, moles that have irregular borders, moles that are larger than a pencil eraser, or moles that have changed in shape or color.  Stay current with required immunizations.  Influenza. You need a dose every fall (or winter). The composition of the flu vaccine changes each year, so being vaccinated once is not enough.  Pneumococcal polysaccharide. You need 1 to 2 doses if you smoke cigarettes or if you  have certain chronic medical conditions. You need 1 dose at age 22 (or older) if you have never been vaccinated.  Tetanus, diphtheria, pertussis (Tdap, Td). Get 1 dose of Tdap vaccine if you are younger than age 26, are over 61 and have contact with an infant, are a Research scientist (physical sciences), are pregnant, or simply want to be protected from whooping cough. After that, you need a Td booster dose every 10 years. Consult your caregiver if you have not had at least 3 tetanus and diphtheria-containing shots sometime in your life or have a deep or dirty wound.  HPV. You need this vaccine if you are a woman age 57 or younger. The vaccine is given in 3 doses over 6 months.  Measles,  mumps, rubella (MMR). You need at least 1 dose of MMR if you were born in 1957 or later. You may also need a second dose.  Meningococcal. If you are age 29 to 50 and a first-year college student living in a residence hall, or have one of several medical conditions, you need to get vaccinated against meningococcal disease. You may also need additional booster doses.  Zoster (shingles). If you are age 67 or older, you should get this vaccine.  Varicella (chickenpox). If you have never had chickenpox or you were vaccinated but received only 1 dose, talk to your caregiver to find out if you need this vaccine.  Hepatitis A. You need this vaccine if you have a specific risk factor for hepatitis A virus infection or you simply wish to be protected from this disease. The vaccine is usually given as 2 doses, 6 to 18 months apart.  Hepatitis B. You need this vaccine if you have a specific risk factor for hepatitis B virus infection or you simply wish to be protected from this disease. The vaccine is given in 3 doses, usually over 6 months. Preventive Services / Frequency Ages 67 to 73  Blood pressure check.** / Every 1 to 2 years.  Lipid and cholesterol check.** / Every 5 years beginning at age 40.  Clinical breast exam.** / Every 3 years for women in their 6s and 30s.  Pap test.** / Every 2 years from ages 42 through 65. Every 3 years starting at age 39 through age 49 or 69 with a history of 3 consecutive normal Pap tests.  HPV screening.** / Every 3 years from ages 34 through ages 43 to 28 with a history of 3 consecutive normal Pap tests.  Hepatitis C blood test.** / For any individual with known risks for hepatitis C.  Skin self-exam. / Monthly.  Influenza immunization.** / Every year.  Pneumococcal polysaccharide immunization.** / 1 to 2 doses if you smoke cigarettes or if you have certain chronic medical conditions.  Tetanus, diphtheria, pertussis (Tdap, Td) immunization. / A one-time  dose of Tdap vaccine. After that, you need a Td booster dose every 10 years.  HPV immunization. / 3 doses over 6 months, if you are 48 and younger.  Measles, mumps, rubella (MMR) immunization. / You need at least 1 dose of MMR if you were born in 1957 or later. You may also need a second dose.  Meningococcal immunization. / 1 dose if you are age 32 to 5 and a first-year college student living in a residence hall, or have one of several medical conditions, you need to get vaccinated against meningococcal disease. You may also need additional booster doses.  Varicella immunization.** / Consult your caregiver.  Hepatitis A immunization.** /  Consult your caregiver. 2 doses, 6 to 18 months apart.  Hepatitis B immunization.** / Consult your caregiver. 3 doses usually over 6 months. Ages 32 to 37  Blood pressure check.** / Every 1 to 2 years.  Lipid and cholesterol check.** / Every 5 years beginning at age 39.  Clinical breast exam.** / Every year after age 32.  Mammogram.** / Every year beginning at age 26 and continuing for as long as you are in good health. Consult with your caregiver.  Pap test.** / Every 3 years starting at age 54 through age 34 or 19 with a history of 3 consecutive normal Pap tests.  HPV screening.** / Every 3 years from ages 10 through ages 19 to 53 with a history of 3 consecutive normal Pap tests.  Fecal occult blood test (FOBT) of stool. / Every year beginning at age 63 and continuing until age 2. You may not need to do this test if you get a colonoscopy every 10 years.  Flexible sigmoidoscopy or colonoscopy.** / Every 5 years for a flexible sigmoidoscopy or every 10 years for a colonoscopy beginning at age 31 and continuing until age 28.  Hepatitis C blood test.** / For all people born from [birth year] through 1966 and any individual with known risks for hepatitis C.  Skin self-exam. / Monthly.  Influenza immunization.** / Every year.  Pneumococcal polysaccharide  immunization.** / 1 to 2 doses if you smoke cigarettes or if you have certain chronic medical conditions.  Tetanus, diphtheria, pertussis (Tdap, Td) immunization.** / A one-time dose of Tdap vaccine. After that, you need a Td booster dose every 10 years.  Measles, mumps, rubella (MMR) immunization. / You need at least 1 dose of MMR if you were born in 1957 or later. You may also need a second dose.  Varicella immunization.** / Consult your caregiver.  Meningococcal immunization.** / Consult your caregiver.  Hepatitis A immunization.** / Consult your caregiver. 2 doses, 6 to 18 months apart.  Hepatitis B immunization.** / Consult your caregiver. 3 doses, usually over 6 months. Ages 43 and over  Blood pressure check.** / Every 1 to 2 years.  Lipid and cholesterol check.** / Every 5 years beginning at age 28.  Clinical breast exam.** / Every year after age 56.  Mammogram.** / Every year beginning at age 30 and continuing for as long as you are in good health. Consult with your caregiver.  Pap test.** / Every 3 years starting at age 52 through age 75 or 62 with a 3 consecutive normal Pap tests. Testing can be stopped between 65 and 70 with 3 consecutive normal Pap tests and no abnormal Pap or HPV tests in the past 10 years.  HPV screening.** / Every 3 years from ages 46 through ages 44 or 41 with a history of 3 consecutive normal Pap tests. Testing can be stopped between 65 and 70 with 3 consecutive normal Pap tests and no abnormal Pap or HPV tests in the past 10 years.  Fecal occult blood test (FOBT) of stool. / Every year beginning at age 2 and continuing until age 15. You may not need to do this test if you get a colonoscopy every 10 years.  Flexible sigmoidoscopy or colonoscopy.** / Every 5 years for a flexible sigmoidoscopy or every 10 years for a colonoscopy beginning at age 78 and continuing until age 26.  Hepatitis C blood test.** / For all people born from [birth year] through 1966 and  any individual with known risks for  hepatitis C.  Osteoporosis screening.** / A one-time screening for women ages 48 and over and women at risk for fractures or osteoporosis.  Skin self-exam. / Monthly.  Influenza immunization.** / Every year.  Pneumococcal polysaccharide immunization.** / 1 dose at age 75 (or older) if you have never been vaccinated.  Tetanus, diphtheria, pertussis (Tdap, Td) immunization. / A one-time dose of Tdap vaccine if you are over 65 and have contact with an infant, are a Research scientist (physical sciences), or simply want to be protected from whooping cough. After that, you need a Td booster dose every 10 years.  Varicella immunization.** / Consult your caregiver.  Meningococcal immunization.** / Consult your caregiver.  Hepatitis A immunization.** / Consult your caregiver. 2 doses, 6 to 18 months apart.  Hepatitis B immunization.** / Check with your caregiver. 3 doses, usually over 6 months. ** Family history and personal history of risk and conditions may change your caregiver's recommendations. Document Released: 06/29/2001 Document Revised: 07/26/2011 Document Reviewed: 09/28/2010 [facility] Patient Information 2013 [place], [place].   Anemia, Frequently Asked Questions WHAT ARE THE SYMPTOMS OF ANEMIA?  Headache.  Difficulty thinking.  Fatigue.  Shortness of breath.  Weakness.  Rapid heartbeat. AT WHAT POINT ARE PEOPLE CONSIDERED ANEMIC?  This varies with gender and age.   Both hemoglobin (Hgb) and hematocrit values are used to define anemia. These lab values are obtained from a complete blood count (CBC) test. This is performed at a caregiver's office.  The normal range of hemoglobin values for adult men is 14.0 g/dL to 08.6 g/dL. For nonpregnant women, values are 12.3 g/dL to 57.8 g/dL.  The World Health Organization defines anemia as less than 12 g/dL for nonpregnant women and less than 13 g/dL for men.  For adult males, the average normal hematocrit is  46%, and the range is 40% to 52%.  For adult females, the average normal hematocrit is 41%, and the range is 35% to 47%.  Values that fall below the lower limits can be a sign of anemia and should have further checking (evaluation). GROUPS OF PEOPLE WHO ARE AT RISK FOR DEVELOPING ANEMIA INCLUDE:   Infants who are breastfed or taking a formula that is not fortified with iron.  Children going through a rapid growth spurt. The iron available can not keep up with the needs for a red cell mass which must grow with the child.  Women in childbearing years. They need iron because of blood loss during menstruation.  Pregnant women. The growing fetus creates a high demand for iron.  People with ongoing gastrointestinal blood loss are at risk of developing iron deficiency.  Individuals with leukemia or cancer who must receive chemotherapy or radiation to treat their disease. The drugs or radiation used to treat these diseases often decreases the bone marrow's ability to make cells of all classes. This includes red blood cells, white blood cells, and platelets.  Individuals with chronic inflammatory conditions such as rheumatoid arthritis or chronic infections.  The elderly. ARE SOME TYPES OF ANEMIA INHERITED?   Yes, some types of anemia are due to inherited or genetic defects.  Sickle cell anemia. This occurs most often in people of African, African American, and Mediterranean descent.  Thalassemia (or Cooley's anemia). This type is found in people of Mediterranean and Southeast Asian descent. These types of anemia are common.  [place]. This is rare. CAN CERTAIN MEDICATIONS CAUSE A PERSON TO BECOME ANEMIC?  Yes. For example, drugs to fight cancer (chemotherapeutic agents) often cause anemia. These drugs  can slow the bone marrow's ability to make red blood cells. If there are not enough red blood cells, the body does not get enough oxygen. WHAT HEMATOCRIT LEVEL IS REQUIRED TO DONATE BLOOD?  The  lower limit of an acceptable hematocrit for blood donors is 38%. If you have a low hematocrit value, you should schedule an appointment with your caregiver. ARE BLOOD TRANSFUSIONS COMMONLY USED TO CORRECT ANEMIA, AND ARE THEY DANGEROUS?  They are used to treat anemia as a last resort. Your caregiver will find the cause of the anemia and correct it if possible. Most blood transfusions are given because of excessive bleeding at the time of surgery, with trauma, or because of bone marrow suppression in patients with cancer or leukemia on chemotherapy. Blood transfusions are safer than ever before. We also know that blood transfusions affect the immune system and may increase certain risks. There is also a concern for human error. In 1/16,000 transfusions, a patient receives a transfusion of blood that is not matched with his or her blood type.  WHAT IS IRON DEFICIENCY ANEMIA AND CAN I CORRECT IT BY CHANGING MY DIET?  Iron is an essential part of hemoglobin. Without enough hemoglobin, anemia develops and the body does not get the right amount of oxygen. Iron deficiency anemia develops after the body has had a low level of iron for a long time. This is either caused by blood loss, not taking in or absorbing enough iron, or increased demands for iron (like pregnancy or rapid growth).  Foods from animal origin such as beef, chicken, and pork, are good sources of iron. Be sure to have one of these foods at each meal. Vitamin C helps your body absorb iron. Foods rich in Vitamin C include citrus, bell pepper, strawberries, spinach and cantaloupe. In some cases, iron supplements may be needed in order to correct the iron deficiency. In the case of poor absorption, extra iron may have to be given directly into the vein through a needle (intravenously). I HAVE BEEN DIAGNOSED WITH IRON DEFICIENCY ANEMIA AND MY CAREGIVER PRESCRIBED IRON SUPPLEMENTS. HOW LONG WILL IT TAKE FOR MY BLOOD TO BECOME NORMAL?  It depends on the  degree of anemia at the beginning of treatment. Most people with mild to moderate iron deficiency, anemia will correct the anemia over a period of 2 to 3 months. But after the anemia is corrected, the iron stored by the body is still low. Caregivers often suggest an additional 6 months of oral iron therapy once the anemia has been reversed. This will help prevent the iron deficiency anemia from quickly happening again. Non-anemic adult males should take iron supplements only under the direction of a doctor, too much iron can cause liver damage.  MY HEMOGLOBIN IS 9 G/DL AND I AM SCHEDULED FOR SURGERY. SHOULD I POSTPONE THE SURGERY?  If you have Hgb of 9, you should discuss this with your caregiver right away. Many patients with similar hemoglobin levels have had surgery without problems. If minimal blood loss is expected for a minor procedure, no treatment may be necessary.  If a greater blood loss is expected for more extensive procedures, you should ask your caregiver about being treated with erythropoietin and iron. This is to accelerate the recovery of your hemoglobin to a normal level before surgery. An anemic patient who undergoes high-blood-loss surgery has a greater risk of surgical complications and need for a blood transfusion, which also carries some risk.  I HAVE BEEN TOLD THAT HEAVY MENSTRUAL  PERIODS CAUSE ANEMIA. IS THERE ANYTHING I CAN DO TO PREVENT THE ANEMIA?  Anemia that results from heavy periods is usually due to iron deficiency. You can try to meet the increased demands for iron caused by the heavy monthly blood loss by increasing the intake of iron-rich foods. Iron supplements may be required. Discuss your concerns with your caregiver. WHAT CAUSES ANEMIA DURING PREGNANCY?  Pregnancy places major demands on the body. The mother must meet the needs of both her body and her growing baby. The body needs enough iron and folate to make the right amount of red blood cells. To prevent anemia  while pregnant, the mother should stay in close contact with her caregiver.  Be sure to eat a diet that has foods rich in iron and folate like liver and dark green leafy vegetables. Folate plays an important role in the normal development of a baby's spinal cord. Folate can help prevent serious disorders like spina bifida. If your diet does not provide adequate nutrients, you may want to talk with your caregiver about nutritional supplements.  WHAT IS THE RELATIONSHIP BETWEEN FIBROID TUMORS AND ANEMIA IN WOMEN?  The relationship is usually caused by the increased menstrual blood loss caused by fibroids. Good iron intake may be required to prevent iron deficiency anemia from developing.  Document Released: 12/10/2003 Document Revised: 07/26/2011 Document Reviewed: 05/26/2010 [facility] Patient Information 2013 [place], [place].     Neta Mends. Panosh M.D.

## 2012-06-17 DIAGNOSIS — M479 Spondylosis, unspecified: Secondary | ICD-10-CM | POA: Insufficient documentation

## 2012-06-26 ENCOUNTER — Other Ambulatory Visit: Payer: Self-pay | Admitting: Family Medicine

## 2012-06-26 DIAGNOSIS — D649 Anemia, unspecified: Secondary | ICD-10-CM

## 2012-07-01 ENCOUNTER — Other Ambulatory Visit: Payer: Self-pay

## 2012-07-04 ENCOUNTER — Encounter: Payer: Self-pay | Admitting: Gastroenterology

## 2012-07-26 ENCOUNTER — Encounter: Payer: Self-pay | Admitting: Gastroenterology

## 2012-07-26 ENCOUNTER — Ambulatory Visit (INDEPENDENT_AMBULATORY_CARE_PROVIDER_SITE_OTHER): Payer: BC Managed Care – PPO | Admitting: Gastroenterology

## 2012-07-26 ENCOUNTER — Encounter: Payer: Self-pay | Admitting: Internal Medicine

## 2012-07-26 ENCOUNTER — Telehealth: Payer: Self-pay | Admitting: *Deleted

## 2012-07-26 ENCOUNTER — Other Ambulatory Visit: Payer: BC Managed Care – PPO

## 2012-07-26 VITALS — BP 112/60 | HR 115 | Ht 59.0 in | Wt 164.4 lb

## 2012-07-26 DIAGNOSIS — D509 Iron deficiency anemia, unspecified: Secondary | ICD-10-CM

## 2012-07-26 DIAGNOSIS — E538 Deficiency of other specified B group vitamins: Secondary | ICD-10-CM

## 2012-07-26 DIAGNOSIS — D649 Anemia, unspecified: Secondary | ICD-10-CM

## 2012-07-26 DIAGNOSIS — G8929 Other chronic pain: Secondary | ICD-10-CM

## 2012-07-26 DIAGNOSIS — M549 Dorsalgia, unspecified: Secondary | ICD-10-CM

## 2012-07-26 MED ORDER — CYANOCOBALAMIN 500 MCG/0.1ML NA SOLN: NASAL | Status: DC

## 2012-07-26 NOTE — Progress Notes (Signed)
History of Present Illness:  This is a 62 year old Caucasian female who recently on laboratory tests and was found to have B12 and mild iron deficiency.  She denies any GI complaints for occasional constipation.  Previous colonoscopy in February of 2011 was unremarkable except for a redundant colon.  She denies any upper GI or hepatobiliary complaints.  Her main complaint is mild fatigue but no cardiovascular pulmonary complaints.  She is status post appendectomy, hysterectomy, and has chronic spinal stenosis.  She previously was on rather heavy doses of NSAIDs which were discontinued, and she is followed by Dr. Epifania Gore neurosurgery.  She gives no history of previous peptic ulcer disease, melena, hematochezia, or symptoms of malabsorption.  Family history is noncontributory.  She currently is on B12 orally and ferrous sulfate 325 mg a day.  The patient denies nay associated mouth sores, skin rashes, or inflammatory arthritis.  I have reviewed this patient's present history, medical and surgical past history, allergies and medications.     ROS:   All systems were reviewed and are negative unless otherwise stated in the HPI.    Physical Exam: Blood pressure 142/80, pulse 92 and regular, and weight 166 BMI of 33.51.  98% oxygen saturation on room air. General well developed well nourished patient in no acute distress, appearing their stated age Eyes PERRLA, no icterus, fundoscopic exam per opthamologist Skin no lesions noted Neck supple, no adenopathy, no thyroid enlargement, no tenderness Chest clear to percussion and auscultation Heart no significant murmurs, gallops or rubs noted Abdomen no hepatosplenomegaly masses or tenderness, BS normal.  Extremities no acute joint lesions, edema, phlebitis or evidence of cellulitis. Neurologic patient oriented x 3, cranial nerves intact, no focal neurologic deficits noted. Psychological mental status normal and normal affect.  Assessment and plan: Chronic  iron and B12 deficiency of unexplained etiology.  Considerations would be previous blood loss from NSAID gastropathy ,chronic atrophic gastritis with pernicious anemia, or celiac disease.  Unfortunately, B12 injections for parenteral replacement therapy at not available currently.  I have started her on Nascobal B12 spray, we'll continue oral iron, and have ordered celiac profile and antiparietal cell antibody levels.  She may need endoscopic exam.  I've also asked her to do IFOB stool cards to exclude ongoing GI blood loss.  Of course if these are positive we will need to repeat her colonoscopy, endoscopy, consider small intestinal pill camera exam.  Have asked her continue to avoid NSAIDs if at all possible.  Encounter Diagnosis  Name Primary?  Marland Kitchen Anemia Yes

## 2012-07-26 NOTE — Telephone Encounter (Signed)
Patient will come by today for ifob.

## 2012-07-26 NOTE — Patient Instructions (Addendum)
   Your physician has requested that you go to the basement for the following lab work and stool studies before leaving today: IFOB, Celiac Panel and Anti-parietal Cell Antibody.  We have sent the following medications to your pharmacy for you to pick up at your convenience: Nascobal, it is a nasal spray for B 12. Information given today about Nascobal.  ___________________________________________________________________________________________________________________                                               We are excited to introduce MyChart, a new best-in-class service that provides you online access to important information in your electronic medical record. We want to make it easier for you to view your health information - all in one secure location - when and where you need it. We expect MyChart will enhance the quality of care and service we provide.  When you register for MyChart, you can:    View your test results.    Request appointments and receive appointment reminders via email.    Request medication renewals.    View your medical history, allergies, medications and immunizations.    Communicate with your physician's office through a password-protected site.    Conveniently print information such as your medication lists.  To find out if MyChart is right for you, please talk to a member of our clinical staff today. We will gladly answer your questions about this free health and wellness tool.  If you are age 63 or older and want a member of your family to have access to your record, you must provide written consent by completing a proxy form available at our office. Please speak to our clinical staff about guidelines regarding accounts for patients younger than age 54.  As you activate your MyChart account and need any technical assistance, please call the MyChart technical support line at [phone]) or email your question to  [email or web address]. If you email your question(s), please include your name, a return phone number and the best time to reach you.  If you have non-urgent health-related questions, you can send a message to our office through MyChart at [email or web address]. If you have a medical emergency, call 911.  Thank you for using MyChart as your new health and wellness resource!   MyChart licensed from [employer],  [id number]. Patents Pending.

## 2012-07-27 LAB — CELIAC PANEL 10
Gliadin IgA: 6.5 U/mL (ref <20)
Gliadin IgG: 8.5 U/mL (ref <20)
Tissue Transglut Ab: 11.9 U/mL (ref <20)
Tissue Transglutaminase Ab, IgA: 10.4 U/mL (ref <20)

## 2012-07-27 LAB — ANTI-PARIETAL ANTIBODY: Parietal Cell Antibody-IgG: NEGATIVE

## 2012-07-28 ENCOUNTER — Other Ambulatory Visit (INDEPENDENT_AMBULATORY_CARE_PROVIDER_SITE_OTHER): Payer: BC Managed Care – PPO

## 2012-07-28 DIAGNOSIS — D649 Anemia, unspecified: Secondary | ICD-10-CM

## 2012-08-04 ENCOUNTER — Ambulatory Visit (INDEPENDENT_AMBULATORY_CARE_PROVIDER_SITE_OTHER): Payer: BC Managed Care – PPO | Admitting: Internal Medicine

## 2012-08-04 ENCOUNTER — Encounter: Payer: Self-pay | Admitting: Internal Medicine

## 2012-08-04 VITALS — BP 136/74 | HR 109 | Temp 99.0°F | Wt 164.0 lb

## 2012-08-04 DIAGNOSIS — I10 Essential (primary) hypertension: Secondary | ICD-10-CM

## 2012-08-04 DIAGNOSIS — D649 Anemia, unspecified: Secondary | ICD-10-CM

## 2012-08-04 DIAGNOSIS — E538 Deficiency of other specified B group vitamins: Secondary | ICD-10-CM

## 2012-08-04 DIAGNOSIS — D509 Iron deficiency anemia, unspecified: Secondary | ICD-10-CM

## 2012-08-04 NOTE — Patient Instructions (Signed)
Ok to  Add the  Dissolvable b12   1000 mcg in addition to the nose spray. Then plan   To recheck labs studies .   To include b12 cbcdiff and iron studies .    I will flag  Dr Jarold Motto about what we are doing and to see if he  Has other input.

## 2012-08-04 NOTE — Progress Notes (Signed)
Chief Complaint  Patient presents with  . Follow-up    Anemia    HPI: Patient comes in for followup his anemia since her last visit she's had no major changes in her health  Saw dr Jarold Motto evaluating b12 low and iron defic not felt to be celiac disease or pernicious anemia because of negative antibody studies.  because of the shortage of injectable B12 he has recommended nasal spray for about 8 weeks Oral b12 soince  January.    On nose spray  For b12   At this time.   Weekly  For 2 weeks .  It is very expensive.   Taking iron  Pills  to 2 per day.   Is a bit anxious about all this but has no abdominal pain bleeding from stool. She was told by the nurse to followup with Dr. Jarold Motto in about 3 months. However no appointment was made. She hasn't had a followup hemoglobin yet.  She's been told to go off all anti-inflammatories in the short run.  This makes it hard on her back she has some continued fatigue but nothing progressing uncertain if it's getting better ROS: See pertinent positives and negatives per HPI. Sis  apparently has B12 problems  Past Medical History  Diagnosis Date  . Anemia     years ago  . Transfusion history     before her hysterectomy  . Anxiety   . Hyperlipidemia   . Hypertension   . Spinal stenosis   . Spondylosis     degenerative adult  . Bulging lumbar disc     Family History  Problem Relation Age of Onset  . Hypertension Mother   . Arthritis Mother   . Hyperlipidemia Mother   . Memory loss Mother 25    short term  . Other Mother 74    angioplasty  . Arthritis Father   . Hypertension Father   . Heart attack Father   . Hyperthyroidism Sister   . Anemia Sister   . Hyperlipidemia Sister   . Hypothyroidism Sister   . Colon cancer Neg Hx     History   Social History  . Marital Status: Married    Spouse Name: N/A    Number of Children: N/A  . Years of Education: N/A   Social History Main Topics  . Smoking status: Former Games developer  .  Smokeless tobacco: Never Used  . Alcohol Use: Yes     Comment: rare  . Drug Use: No  . Sexually Active: None   Other Topics Concern  . None   Social History Narrative   No ets   Regular exercise- just restarted    HH of 2    Pet dog puppy 6 months    Good sleep   G2P2    Outpatient Encounter Prescriptions as of 08/04/2012  Medication Sig Dispense Refill  . cholecalciferol (VITAMIN D) 1000 UNITS tablet Take 1,000 Units by mouth daily.      . Cyanocobalamin (NASCOBAL) 500 MCG/0.1ML SOLN One spray in one nostril once a week  1.3 mL  1  . diphenhydramine-acetaminophen (TYLENOL PM) 25-500 MG TABS Take 1 tablet by mouth at bedtime as needed.      Marland Kitchen FERROUS SULFATE-VITAMIN C PO Take 1 capsule by mouth daily.      Marland Kitchen lisinopril-hydrochlorothiazide (PRINZIDE,ZESTORETIC) 20-12.5 MG per tablet Take 1 tablet by mouth daily.  90 tablet  3  . rosuvastatin (CRESTOR) 10 MG tablet Take 1 tablet (10 mg total) by mouth  daily.  90 tablet  3  . [DISCONTINUED] vitamin B-12 (CYANOCOBALAMIN) 1000 MCG tablet Take 1,000 mcg by mouth daily.       No facility-administered encounter medications on file as of 08/04/2012.    EXAM:  BP 136/74  Pulse 109  Temp(Src) 99 F (37.2 C) (Oral)  Wt 164 lb (74.39 kg)  BMI 33.11 kg/m2  SpO2 96%  Body mass index is 33.11 kg/(m^2).  GENERAL: vitals reviewed and listed above, alert, oriented, appears well hydrated and in no acute distress  HEENT: atraumatic, conjunctiva  clear, no obvious abnormalities on inspection of external nose and ears  NECK: no obvious masses on inspection palpation  LUNGS: clear to auscultation bilaterally, no wheezes, rales or rhonchi, good air movement CV: HRRR, no clubbing cyanosis or  peripheral edema nl cap refill  Abdomen soft without hepatomegaly guarding or rebound MS: moves all extremities without noticeable focal  abnormality  PSYCH: pleasant and cooperative, no obvious depression   minimally anxious good eye contact normal  speech  Lab Results  Component Value Date   WBC 9.1 06/16/2012   HGB 10.6* 06/16/2012   HCT 32.3* 06/16/2012   PLT 482.0* 06/16/2012   GLUCOSE 106* 06/09/2012   CHOL 155 06/09/2012   TRIG 84.0 06/09/2012   HDL 38.20* 06/09/2012   LDLDIRECT 214.5 03/28/2008   LDLCALC 100* 06/09/2012   ALT 16 06/09/2012   AST 16 06/09/2012   NA 137 06/09/2012   K 4.2 06/09/2012   CL 102 06/09/2012   CREATININE 1.0 06/09/2012   BUN 21 06/09/2012   CO2 28 06/09/2012   TSH 2.57 06/09/2012    ASSESSMENT AND PLAN:  Discussed the following assessment and plan:  Anemia  B12 deficiency  Iron deficiency anemia  HYPERTENSION - Controlled Overall patient looks quite well; however she does have significant anemia continue with plan per Dr. Jarold Motto will arrange for laboratory tests be done in one to 2 months to include B12 level CBC iron level and then plan followup I suggest she make a followup appointment with Dr. Jarold Motto As he had wanted It is unfortunate that there is a lack of supply of injectable B12 at this time discussed nasal spray and perhaps adding sublingual followup get level not at peak dose. -Patient advised to return or notify health care team  if symptoms worsen or persist or new concerns arise.  Patient Instructions  Ok to  Add the  Dissolvable b12   1000 mcg in addition to the nose spray. Then plan   To recheck labs studies .   To include b12 cbcdiff and iron studies .    I will flag  Dr Jarold Motto about what we are doing and to see if he  Has other input.         Neta Mends. Zakkiyya Barno M.D.

## 2012-08-23 ENCOUNTER — Encounter: Payer: Self-pay | Admitting: Gastroenterology

## 2012-09-12 LAB — HM MAMMOGRAPHY: HM Mammogram: NEGATIVE

## 2012-09-13 ENCOUNTER — Other Ambulatory Visit (INDEPENDENT_AMBULATORY_CARE_PROVIDER_SITE_OTHER): Payer: BC Managed Care – PPO

## 2012-09-13 DIAGNOSIS — E538 Deficiency of other specified B group vitamins: Secondary | ICD-10-CM

## 2012-09-13 DIAGNOSIS — D509 Iron deficiency anemia, unspecified: Secondary | ICD-10-CM

## 2012-09-13 LAB — IBC PANEL: Iron: 19 ug/dL — ABNORMAL LOW (ref 42–145)

## 2012-09-13 LAB — CBC WITH DIFFERENTIAL/PLATELET
Eosinophils Relative: 2.3 % (ref 0.0–5.0)
HCT: 29.6 % — ABNORMAL LOW (ref 36.0–46.0)
Lymphs Abs: 1.8 10*3/uL (ref 0.7–4.0)
MCV: 72.9 fl — ABNORMAL LOW (ref 78.0–100.0)
Monocytes Absolute: 0.6 10*3/uL (ref 0.1–1.0)
Platelets: 483 10*3/uL — ABNORMAL HIGH (ref 150.0–400.0)
WBC: 8 10*3/uL (ref 4.5–10.5)

## 2012-09-15 ENCOUNTER — Encounter: Payer: Self-pay | Admitting: Internal Medicine

## 2012-09-18 ENCOUNTER — Encounter: Payer: Self-pay | Admitting: Gastroenterology

## 2012-09-18 ENCOUNTER — Encounter: Payer: Self-pay | Admitting: Internal Medicine

## 2012-09-28 ENCOUNTER — Encounter: Payer: Self-pay | Admitting: Gastroenterology

## 2012-09-28 ENCOUNTER — Ambulatory Visit (INDEPENDENT_AMBULATORY_CARE_PROVIDER_SITE_OTHER): Payer: BC Managed Care – PPO | Admitting: Gastroenterology

## 2012-09-28 VITALS — BP 120/78 | HR 102 | Ht 59.0 in | Wt 161.4 lb

## 2012-09-28 DIAGNOSIS — E538 Deficiency of other specified B group vitamins: Secondary | ICD-10-CM

## 2012-09-28 DIAGNOSIS — D509 Iron deficiency anemia, unspecified: Secondary | ICD-10-CM

## 2012-09-28 DIAGNOSIS — R5381 Other malaise: Secondary | ICD-10-CM

## 2012-09-28 DIAGNOSIS — R1013 Epigastric pain: Secondary | ICD-10-CM

## 2012-09-28 MED ORDER — MOVIPREP 100 G PO SOLR: 1.0000 | Freq: Once | ORAL | Status: DC

## 2012-09-28 NOTE — Patient Instructions (Addendum)

## 2012-09-28 NOTE — Progress Notes (Signed)
This is a 62 year old Caucasian female who has persistent iron deficiency anemia of unexplained etiology.  Stool exams for occult blood have been negative, and she had a negative colonoscopy in February of 2011.  She denies any GI symptoms except for occasional discomfort in her subxiphoid area without associated acid reflux symptoms or dysphagia or any hepatobiliary complaints.  Previous colonoscopy was difficult because of a very long and redundant colon.  She has not had previous endoscopy.  Initially, the patient was found to be B12 deficient, and this is corrected with supplemental vitamin therapy.  She takes ferrous sulfate one tablet a day, and his had no improvement in her iron deficiency.  There is a history of multiple drug allergies.  Previous evaluations serologically negative for celiac disease.  Family history is noncontributory.  There no systemic complaints such as anorexia, weight loss, fever chills.  Labs have shown no evidence of malabsorption otherwise.  Current Medications, Allergies, Past Medical History, Past Surgical History, Family History and Social History were reviewed in [employer] record.  ROS: All systems were reviewed and are negative unless otherwise stated in the HPI.          Physical Exam: Healthy-appearing patient in no distress.  Blood pressure 120/78, pulse 102 and weight 161 with a BMI of 32.58.  Cannot appreciate stigmata of chronic liver disease.  Her chest is clear, she's a regular rhythm without murmurs gallops or rubs.  There is no hepatosplenomegaly, abdominal masses or tenderness.  Bowel sounds are normal.  Mental status is normal.    Assessment and Plan: Persistent iron deficiency of unexplained etiology, rule out occult GI blood loss.  We'll repeat her colonoscopy, endoscopy, consider pill camera exam of her gut.  If these are all unremarkable, we will do IV iron infusion since she cannot tolerate by mouth iron very well.  She  does have some symptoms of anemia with fatigue.  As per above, she denies melena or hematochezia.  At the time of endoscopy we'll check her for H. pylori, also obtained small bowel biopsies.  Recent hematocrit was 30 with a iron saturation of 7.4%. Encounter Diagnoses  Name Primary?  . Other malaise and fatigue Yes  . Anemia   . Abdominal pain, epigastric

## 2012-11-08 ENCOUNTER — Ambulatory Visit (AMBULATORY_SURGERY_CENTER): Payer: BC Managed Care – PPO | Admitting: Gastroenterology

## 2012-11-08 ENCOUNTER — Encounter: Payer: Self-pay | Admitting: Gastroenterology

## 2012-11-08 VITALS — BP 124/57 | HR 90 | Temp 96.5°F | Resp 15 | Ht 59.0 in | Wt 161.0 lb

## 2012-11-08 DIAGNOSIS — R5381 Other malaise: Secondary | ICD-10-CM

## 2012-11-08 DIAGNOSIS — K219 Gastro-esophageal reflux disease without esophagitis: Secondary | ICD-10-CM

## 2012-11-08 DIAGNOSIS — D649 Anemia, unspecified: Secondary | ICD-10-CM

## 2012-11-08 DIAGNOSIS — Z1211 Encounter for screening for malignant neoplasm of colon: Secondary | ICD-10-CM

## 2012-11-08 DIAGNOSIS — Q2733 Arteriovenous malformation of digestive system vessel: Secondary | ICD-10-CM

## 2012-11-08 DIAGNOSIS — R1013 Epigastric pain: Secondary | ICD-10-CM

## 2012-11-08 DIAGNOSIS — R5383 Other fatigue: Secondary | ICD-10-CM

## 2012-11-08 DIAGNOSIS — D509 Iron deficiency anemia, unspecified: Secondary | ICD-10-CM

## 2012-11-08 DIAGNOSIS — K5520 Angiodysplasia of colon without hemorrhage: Secondary | ICD-10-CM

## 2012-11-08 DIAGNOSIS — D1330 Benign neoplasm of unspecified part of small intestine: Secondary | ICD-10-CM

## 2012-11-08 MED ORDER — ESOMEPRAZOLE MAGNESIUM 40 MG PO CPDR: 40.0000 mg | DELAYED_RELEASE_CAPSULE | Freq: Every day | ORAL | Status: DC

## 2012-11-08 MED ORDER — SODIUM CHLORIDE 0.9 % IV SOLN: 500.0000 mL | INTRAVENOUS | Status: DC

## 2012-11-08 NOTE — Progress Notes (Signed)
Called to room to assist during endoscopic procedure.  Patient ID and intended procedure confirmed with present staff. Received instructions for my participation in the procedure from the performing physician.  

## 2012-11-08 NOTE — Patient Instructions (Addendum)

## 2012-11-08 NOTE — Op Note (Signed)
[facility] [address] [place], [address]   COLONOSCOPY PROCEDURE REPORT  PATIENT: Kendra Mcfarland, Kendra Mcfarland.  MR#: [id number] BIRTHDATE: 08-17-1950 , 61  yrs. old GENDER: Female ENDOSCOPIST: Mardella Layman, MD, Clementeen Graham REFERRED BY:  Madelin Headings, M.D. PROCEDURE DATE:  11/08/2012 PROCEDURE:   Colonoscopy, screening ASA CLASS:   Class II INDICATIONS:Iron Deficiency Anemia. MEDICATIONS: propofol (Diprivan) 300mg  IV  DESCRIPTION OF PROCEDURE:   After the risks and benefits and of the procedure were explained, informed consent was obtained.  A digital rectal exam revealed no abnormalities of the rectum.    The [id number]  endoscope was introduced through the anus and advanced to the cecum, which was identified by both the appendix and ileocecal valve .  The quality of the prep was adequate. .  The instrument was then slowly withdrawn as the colon was fully examined.     COLON FINDINGS: The prep throughout the colon was marginal, and a large volume of liquid material was aspirated.  On withdrawal the colon mucosa does appear normal with a small AVM in the descending colon.  Pictures were obtained for documentation.  Examination of the rectosigmoid there was unremarkable except for some small external hemorrhoids.  Patient tolerated this procedure well.   . Patient tolerated this procedure well.     Retroflexed views revealed no abnormalities.     The scope was then withdrawn from the patient and the procedure completed.  COMPLICATIONS: There were no complications. ENDOSCOPIC IMPRESSION: this was a difficult colonoscopy because of a poor prep.  However, no polyps were noted.  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  Upper endoscopy will be scheduled 2.  Continue current medications 3.  Continue current colorectal screening recommendations for "routine  risk" patients with a repeat colonoscopy in 10 years.   REPEAT EXAM:  cc:  _______________________________ eSignedMardella Layman, MD, [facility] 11/08/2012 10:29 AM

## 2012-11-08 NOTE — Progress Notes (Signed)
Pt was drinking movi prep 1st dose, after 45 mins of prep started vomiting prep, pt called on-call and was told dilute movi prep an drink slower, also to purchase gatorage and miralax, this morning at 3am pt had 2 glasses of movi prep and started vomiting prep again, then started the gatorade miralax prep, last stool was dark yellow with flecks per pt-adm

## 2012-11-08 NOTE — Op Note (Signed)
[facility] [address] [place], [address]   ENDOSCOPY PROCEDURE REPORT  PATIENT: Kendra, Mcfarland.  MR#: [id number] BIRTHDATE: Sep 09, 1950 , 61  yrs. old GENDER: Female ENDOSCOPIST:Howard Bunte Hale Bogus, MD, Clementeen Graham REFERRED BY: Madelin Headings, M.D. PROCEDURE DATE:  11/08/2012 PROCEDURE:   EGD w/ biopsy and EGD w/ biopsy for H.pylori ASA CLASS:    Class II INDICATIONS: Iron deficiency anemia. MEDICATION: There was residual sedation effect present from prior procedure and Propofol (Diprivan) 40 mg IV TOPICAL ANESTHETIC:  DESCRIPTION OF PROCEDURE:   After the risks and benefits of the procedure were explained, informed consent was obtained.  The LB [id number]  endoscope was introduced through the mouth  and advanced to the    .  The instrument was slowly withdrawn as the mucosa was fully examined.      DUODENUM: The duodenal mucosa showed no abnormalities in the bulb and second portion of the duodenum.  Cold forceps biopsies were taken in the bulb and second portion.  STOMACH: The mucosa of the stomach appeared normal.  A CLO  biopsy was performed.  ESOPHAGUS: There was LA Class A esophagitis noted. biopsies were obtained at the gastroesophageal junction for pathologic review. There is a large sliding hiatal hernia noted with an early stricture at the GE junction.  Pictures were obtained for documentation. Retroflexed views revealed a hiatal hernia.    The scope was then withdrawn from the patient and the procedure completed.  COMPLICATIONS: There were no complications.   ENDOSCOPIC IMPRESSION: 1.   The duodenal mucosa showed no abnormalities in the bulb and second portion of the duodenum..r/o celiac disease 2.   The mucosa of the stomach appeared normal; biopsy ...r/o H.pylori 3.   There was LA Class A esophagitis noted and a large hiatal hernia, rule out Barrett's mucosa.  I did not see any active bleeding in the upper GI  tract.  RECOMMENDATIONS: 1.  Await pathology results 2.  continue PPI medication 3.consider small bowel pill camera exam. Continue PPI           _______________________________ eSigned:  Mardella Layman, MD, [facility] 11/08/2012 10:36 AM   standard discharge   PATIENT NAME:  Kendra Mcfarland. MR#: [id number]

## 2012-11-08 NOTE — Progress Notes (Signed)
Report to pacu rn, vss, bbs=clear 

## 2012-11-08 NOTE — Progress Notes (Signed)
Patient did not experience any of the following events: a burn prior to discharge; a fall within the facility; wrong site/side/patient/procedure/implant event; or a hospital transfer or hospital admission upon discharge from the facility. (G8907) Patient did not have preoperative order for IV antibiotic SSI prophylaxis. (G8918)  

## 2012-11-09 ENCOUNTER — Telehealth: Payer: Self-pay | Admitting: *Deleted

## 2012-11-09 LAB — HELICOBACTER PYLORI SCREEN-BIOPSY: UREASE: NEGATIVE

## 2012-11-09 NOTE — Telephone Encounter (Signed)
  Follow up Call-  Call back number 11/08/2012  Post procedure Call Back phone  # [phone]  Permission to leave phone message Yes     Patient questions:  Do you have a fever, pain , or abdominal swelling? no Pain Score  0 *  Have you tolerated food without any problems? yes  Have you been able to return to your normal activities? yes  Do you have any questions about your discharge instructions: Diet   no Medications  no Follow up visit  no  Do you have questions or concerns about your Care? no  Actions: * If pain score is 4 or above: No action needed, pain <4.

## 2012-11-10 ENCOUNTER — Encounter: Payer: Self-pay | Admitting: Gastroenterology

## 2012-11-13 ENCOUNTER — Encounter: Payer: Self-pay | Admitting: Gastroenterology

## 2012-11-15 ENCOUNTER — Telehealth: Payer: Self-pay | Admitting: *Deleted

## 2012-11-15 NOTE — Telephone Encounter (Signed)
Spoke with pt about her possible pill cam. Pt is very reluctant to do another prep d/t she had a horrible experience with Miralax. Informed her when she see Dr Jarold Motto on 11/24/12, one of the nurses will show her the pill and ask Dr Jarold Motto if we can do a smaller prep such as mag citrate, Su Prep or Prep o

## 2012-11-19 ENCOUNTER — Encounter: Payer: Self-pay | Admitting: Gastroenterology

## 2012-11-20 ENCOUNTER — Telehealth: Payer: Self-pay | Admitting: *Deleted

## 2012-11-20 NOTE — Telephone Encounter (Signed)
Pt was confused about appt she was given and then a path letter asking her to schedule a later appt. That has been settled and pt will see Dr Jarold Motto on 11/24/12. Pt is scheduled as f/u but is very reluctant to do another prep needed for a pill cam; this was discussed in an earlier note/encounter. Pt decided she would discuss preps with Dr Jarold Motto and perhaps Suprep or PrepoPIC could be used. Today, pt states she's not sure she wants to do another test until her iron studies are repeated. I advised pt to see Dr Jarold Motto and discuss both of these problems with him and it's noted in the appt notes.

## 2012-11-24 ENCOUNTER — Encounter: Payer: Self-pay | Admitting: Gastroenterology

## 2012-11-24 ENCOUNTER — Ambulatory Visit (INDEPENDENT_AMBULATORY_CARE_PROVIDER_SITE_OTHER): Payer: BC Managed Care – PPO | Admitting: Gastroenterology

## 2012-11-24 ENCOUNTER — Ambulatory Visit (INDEPENDENT_AMBULATORY_CARE_PROVIDER_SITE_OTHER): Payer: BC Managed Care – PPO

## 2012-11-24 VITALS — BP 118/70 | HR 100 | Ht 59.0 in | Wt 156.2 lb

## 2012-11-24 DIAGNOSIS — D509 Iron deficiency anemia, unspecified: Secondary | ICD-10-CM

## 2012-11-24 DIAGNOSIS — D649 Anemia, unspecified: Secondary | ICD-10-CM

## 2012-11-24 DIAGNOSIS — E538 Deficiency of other specified B group vitamins: Secondary | ICD-10-CM

## 2012-11-24 DIAGNOSIS — K219 Gastro-esophageal reflux disease without esophagitis: Secondary | ICD-10-CM

## 2012-11-24 LAB — CBC WITH DIFFERENTIAL/PLATELET
Eosinophils Relative: 4.2 % (ref 0.0–5.0)
Lymphocytes Relative: 19.5 % (ref 12.0–46.0)
Monocytes Relative: 8.7 % (ref 3.0–12.0)
Neutrophils Relative %: 66.6 % (ref 43.0–77.0)
Platelets: 586 10*3/uL — ABNORMAL HIGH (ref 150.0–400.0)
WBC: 10.1 10*3/uL (ref 4.5–10.5)

## 2012-11-24 LAB — IBC PANEL
Iron: 16 ug/dL — ABNORMAL LOW (ref 42–145)
Transferrin: 190.1 mg/dL — ABNORMAL LOW (ref 212.0–360.0)

## 2012-11-24 LAB — VITAMIN B12: Vitamin B-12: >1500 pg/mL — ABNORMAL HIGH (ref 211–911)

## 2012-11-24 LAB — FERRITIN: Ferritin: 119.3 ng/mL (ref 10.0–291.0)

## 2012-11-24 NOTE — Progress Notes (Signed)
This is a 62 year old Caucasian female with unexplained iron deficiency.  Recent GI workup showed a negative colonoscopy, and a prominent hiatal hernia with mild esophagitis.  Biopsy showed no evidence of Barrett's mucosa or celiac disease.  She is status post hysterectomy.  Patient early is on B12 and iron replacement therapy and denies any GI symptoms except for occasional discomfort in her left subxiphoid area.  She currently is on Nexium 40 mg a day and vitamin C. with iron.  She denies other GI or general medical problems.  Current Medications, Allergies, Past Medical History, Past Surgical History, Family History and Social History were reviewed in [employer] record.  ROS: All systems were reviewed and are negative unless otherwise stated in the HPI.          Physical Exam: Blood pressure 118/70, pulse 100 and regular and weight 156 the BMI of 31.53.  I cannot appreciate stigmata of chronic liver disease.  Chest is clear and she is in a regular rhythm without murmurs gallops or rubs.  There is no organomegaly, abdominal masses or tenderness.  Bowel sounds are normal.  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  Her Hemoccult cards as an outpatient been negative.  She does not want to do pill camera exam.  We will repeat her CBC and anemia profile and continue antireflux maneuvers with daily PPI therapy.  We will continue to follow her when necessary basis as needed.  If her hemoglobin has not risen hours following, we'll proceed with pill camera exam of her small intestine.  Please copy her primary care physician, referring physician, and pertinent subspecialists. Encounter Diagnosis  Name Primary?  Marland Kitchen Anemia Yes

## 2012-11-24 NOTE — Patient Instructions (Signed)
  Your physician has requested that you go to the basement for the following lab work before leaving today: Anemia Panel CBC  We will call you with the results. _________________________________________________________                                               We are excited to introduce MyChart, a new best-in-class service that provides you online access to important information in your electronic medical record. We want to make it easier for you to view your health information - all in one secure location - when and where you need it. We expect MyChart will enhance the quality of care and service we provide.  When you register for MyChart, you can:    View your test results.    Request appointments and receive appointment reminders via email.    Request medication renewals.    View your medical history, allergies, medications and immunizations.    Communicate with your physician's office through a password-protected site.    Conveniently print information such as your medication lists.  To find out if MyChart is right for you, please talk to a member of our clinical staff today. We will gladly answer your questions about this free health and wellness tool.  If you are age 32 or older and want a member of your family to have access to your record, you must provide written consent by completing a proxy form available at our office. Please speak to our clinical staff about guidelines regarding accounts for patients younger than age 83.  As you activate your MyChart account and need any technical assistance, please call the MyChart technical support line at [phone]) or email your question to [email or web address]. If you email your question(s), please include your name, a return phone number and the best time to reach you.  If you have non-urgent health-related questions, you can send a message to our office through MyChart at [email or web address]. If you  have a medical emergency, call 911.  Thank you for using MyChart as your new health and wellness resource!   MyChart licensed from [employer],  [id number]. Patents Pending.

## 2012-11-27 ENCOUNTER — Encounter: Payer: Self-pay | Admitting: Gastroenterology

## 2012-11-27 ENCOUNTER — Telehealth: Payer: Self-pay | Admitting: *Deleted

## 2012-11-27 DIAGNOSIS — D649 Anemia, unspecified: Secondary | ICD-10-CM

## 2012-11-27 NOTE — Telephone Encounter (Signed)
Correction made You can come October 14 between 7:30 am to 5:30 pm  Thank you

## 2012-11-27 NOTE — Telephone Encounter (Signed)
Notes Recorded by Mardella Layman, MD on 11/24/2012 at 2:41 PM Continue po iron and repeat 3 mos and do repeat IFOB please  Patient called back and I advised patient that she needs to continue her iron. I advised patient that we need to do repeat labs in three months. I advised patient I put in orders for Sep 8 for her labs and she can go to the basement between 7:30 am to 5:30 pm. Patient verbalized understanding.

## 2012-12-26 ENCOUNTER — Encounter: Payer: Self-pay | Admitting: Gastroenterology

## 2013-02-27 ENCOUNTER — Other Ambulatory Visit (INDEPENDENT_AMBULATORY_CARE_PROVIDER_SITE_OTHER): Payer: BC Managed Care – PPO

## 2013-02-27 DIAGNOSIS — D649 Anemia, unspecified: Secondary | ICD-10-CM

## 2013-02-27 LAB — FERRITIN: Ferritin: 156.3 ng/mL (ref 10.0–291.0)

## 2013-02-27 LAB — IBC PANEL
Saturation Ratios: 6.1 % — ABNORMAL LOW (ref 20.0–50.0)
Transferrin: 174.8 mg/dL — ABNORMAL LOW (ref 212.0–360.0)

## 2013-03-05 ENCOUNTER — Other Ambulatory Visit (INDEPENDENT_AMBULATORY_CARE_PROVIDER_SITE_OTHER): Payer: BC Managed Care – PPO

## 2013-03-05 DIAGNOSIS — D649 Anemia, unspecified: Secondary | ICD-10-CM

## 2013-03-07 ENCOUNTER — Encounter: Payer: Self-pay | Admitting: Gastroenterology

## 2013-03-08 ENCOUNTER — Other Ambulatory Visit: Payer: Self-pay | Admitting: *Deleted

## 2013-03-08 MED ORDER — TANDEM PLUS 162-115.2-1 MG PO CAPS: 1.0000 | ORAL_CAPSULE | Freq: Every day | ORAL | Status: DC

## 2013-03-22 ENCOUNTER — Other Ambulatory Visit: Payer: Self-pay

## 2013-07-06 ENCOUNTER — Encounter: Payer: BC Managed Care – PPO | Admitting: Internal Medicine

## 2013-07-06 ENCOUNTER — Other Ambulatory Visit (INDEPENDENT_AMBULATORY_CARE_PROVIDER_SITE_OTHER): Payer: BC Managed Care – PPO

## 2013-07-06 DIAGNOSIS — Z0000 Encounter for general adult medical examination without abnormal findings: Secondary | ICD-10-CM

## 2013-07-06 LAB — CBC WITH DIFFERENTIAL/PLATELET
BASOS ABS: 0 10*3/uL (ref 0.0–0.1)
Basophils Relative: 0.5 % (ref 0.0–3.0)
Eosinophils Absolute: 0.2 10*3/uL (ref 0.0–0.7)
Eosinophils Relative: 2.2 % (ref 0.0–5.0)
HCT: 28.4 % — ABNORMAL LOW (ref 36.0–46.0)
Lymphocytes Relative: 22.9 % (ref 12.0–46.0)
Lymphs Abs: 2 10*3/uL (ref 0.7–4.0)
MCHC: 30.2 g/dL (ref 30.0–36.0)
MCV: 71.7 fl — ABNORMAL LOW (ref 78.0–100.0)
MONOS PCT: 7.6 % (ref 3.0–12.0)
Monocytes Absolute: 0.7 10*3/uL (ref 0.1–1.0)
Neutro Abs: 5.9 10*3/uL (ref 1.4–7.7)
Neutrophils Relative %: 66.8 % (ref 43.0–77.0)
Platelets: 637 10*3/uL — ABNORMAL HIGH (ref 150.0–400.0)
RBC: 3.97 Mil/uL (ref 3.87–5.11)
RDW: 19.2 % — ABNORMAL HIGH (ref 11.5–14.6)
WBC: 8.8 10*3/uL (ref 4.5–10.5)

## 2013-07-06 LAB — HEPATIC FUNCTION PANEL
ALT: 18 U/L (ref 0–35)
AST: 18 U/L (ref 0–37)
Albumin: 2.8 g/dL — ABNORMAL LOW (ref 3.5–5.2)
Alkaline Phosphatase: 98 U/L (ref 39–117)
Bilirubin, Direct: 0 mg/dL (ref 0.0–0.3)
Total Bilirubin: 0.6 mg/dL (ref 0.3–1.2)
Total Protein: 7.2 g/dL (ref 6.0–8.3)

## 2013-07-06 LAB — LIPID PANEL
Cholesterol: 131 mg/dL (ref 0–200)
HDL: 37.4 mg/dL — AB (ref >39.00)
LDL Cholesterol: 74 mg/dL (ref 0–99)
Total CHOL/HDL Ratio: 4
Triglycerides: 97 mg/dL (ref 0.0–149.0)
VLDL: 19.4 mg/dL (ref 0.0–40.0)

## 2013-07-06 LAB — BASIC METABOLIC PANEL
BUN: 14 mg/dL (ref 6–23)
CALCIUM: 8.8 mg/dL (ref 8.4–10.5)
CO2: 31 meq/L (ref 19–32)
CREATININE: 0.9 mg/dL (ref 0.4–1.2)
Chloride: 99 mEq/L (ref 96–112)
GFR: 68.89 mL/min (ref >60.00)
Glucose, Bld: 88 mg/dL (ref 70–99)
Potassium: 4 mEq/L (ref 3.5–5.1)
Sodium: 139 mEq/L (ref 135–145)

## 2013-07-06 LAB — TSH: TSH: 0.84 u[IU]/mL (ref 0.35–5.50)

## 2013-07-13 ENCOUNTER — Ambulatory Visit (INDEPENDENT_AMBULATORY_CARE_PROVIDER_SITE_OTHER): Payer: BC Managed Care – PPO | Admitting: Internal Medicine

## 2013-07-13 ENCOUNTER — Encounter: Payer: Self-pay | Admitting: Internal Medicine

## 2013-07-13 VITALS — BP 158/72 | HR 84 | Temp 98.9°F | Ht 59.0 in | Wt 157.0 lb

## 2013-07-13 DIAGNOSIS — I10 Essential (primary) hypertension: Secondary | ICD-10-CM

## 2013-07-13 DIAGNOSIS — M4120 Other idiopathic scoliosis, site unspecified: Secondary | ICD-10-CM

## 2013-07-13 DIAGNOSIS — M419 Scoliosis, unspecified: Secondary | ICD-10-CM

## 2013-07-13 DIAGNOSIS — D509 Iron deficiency anemia, unspecified: Secondary | ICD-10-CM

## 2013-07-13 DIAGNOSIS — M479 Spondylosis, unspecified: Secondary | ICD-10-CM

## 2013-07-13 DIAGNOSIS — E785 Hyperlipidemia, unspecified: Secondary | ICD-10-CM

## 2013-07-13 DIAGNOSIS — E8809 Other disorders of plasma-protein metabolism, not elsewhere classified: Secondary | ICD-10-CM | POA: Insufficient documentation

## 2013-07-13 DIAGNOSIS — Z0000 Encounter for general adult medical examination without abnormal findings: Secondary | ICD-10-CM

## 2013-07-13 LAB — POCT URINALYSIS DIP (MANUAL ENTRY)
BILIRUBIN UA: NEGATIVE
Bilirubin, UA: NEGATIVE
Glucose, UA: NEGATIVE
Nitrite, UA: NEGATIVE
Protein Ur, POC: 100
Spec Grav, UA: 1.015
Urobilinogen, UA: 4
pH, UA: 7

## 2013-07-13 MED ORDER — LISINOPRIL-HYDROCHLOROTHIAZIDE 20-12.5 MG PO TABS: 1.0000 | ORAL_TABLET | Freq: Every day | ORAL | Status: DC

## 2013-07-13 MED ORDER — TANDEM PLUS 162-115.2-1 MG PO CAPS: 2.0000 | ORAL_CAPSULE | Freq: Every day | ORAL | Status: DC

## 2013-07-13 MED ORDER — ROSUVASTATIN CALCIUM 10 MG PO TABS: 10.0000 mg | ORAL_TABLET | Freq: Every day | ORAL | Status: DC

## 2013-07-13 NOTE — Progress Notes (Signed)
Chief Complaint  Patient presents with  . Annual Exam    HPI: Patient comes in today for [place] visit   Tired a lot poss from  the anemia   Is on iron per dr Philip Aspen  Declined capsule endoscopy cause had gotten better and concern about procedure   Had tooth infection and bone graft . October otherwise no change in health  advil pm oacassionally  nexium HH ocass bothers her    No bleeding noted .   Had gi eval next step consdier capsule endoscopy .   Neg fam hx  Anemia excep sis with b12 dificincy having to be on injections.   Bp had been ok taking medication  LIPIDS no se of med   Health Maintenance  Topic Date Due  . Zostavax  02/12/2010  . Influenza Vaccine  05/17/2014 (Originally 12/15/2012)  . Pap Smear  06/15/2014  . Mammogram  09/13/2014  . Tetanus/tdap  05/17/2017  . Colonoscopy  11/09/2022   Health Maintenance Review   ROS:  GEN/ HEENT: No fever, significant weight changes sweats headaches vision problems hearing changes, CV/ PULM; No chest pain shortness of breath cough, syncope,edema  change in exercise tolerance. GI /GU: No adominal pain, vomiting, change in bowel habits. No blood in the stool. No significant GU symptoms. SKIN/HEME: ,no acute skin rashes suspicious lesions or bleeding. No lymphadenopathy, nodules, masses.  NEURO/ PSYCH:  No neurologic signs such as weakness numbness. No depression anxiety. IMM/ Allergy: No unusual infections.  Allergy .   REST of 12 system review negative except as per HPI   Past Medical History  Diagnosis Date  . Anemia     years ago  . Transfusion history     before her hysterectomy  . Anxiety   . Hyperlipidemia   . Hypertension   . Spinal stenosis   . Spondylosis     degenerative adult  . Bulging lumbar disc   . MONONUCLEOSIS 08/14/2007    Qualifier: Diagnosis of  Problem Stop Reason: Removed By: Hulan Saas, CMA (AAMA), [place] Skye     Family History  Problem Relation Age of Onset  .  Hypertension Mother   . Arthritis Mother   . Hyperlipidemia Mother   . Memory loss Mother 60    short term  . Other Mother 48    angioplasty  . Arthritis Father   . Hypertension Father   . Heart attack Father   . Hyperthyroidism Sister   . Anemia Sister   . Hyperlipidemia Sister   . Hypothyroidism Sister   . Colon cancer Neg Hx     History   Social History  . Marital Status: Married    Spouse Name: N/A    Number of Children: N/A  . Years of Education: N/A   Social History Main Topics  . Smoking status: Former Smoker    Quit date: 05/17/1994  . Smokeless tobacco: Never Used  . Alcohol Use: Yes     Comment: rare  . Drug Use: No  . Sexual Activity: None   Other Topics Concern  . None   Social History Narrative   No ets   Regular exercise- thinking about yoga  As per dr Ellene Route.    HH of 2    Pet dog puppy 6 months    Good sleep   G2P2    Outpatient Encounter Prescriptions as of 07/13/2013  Medication Sig  . cholecalciferol (VITAMIN D) 1000 UNITS tablet Take 1,000 Units by mouth daily.  Marland Kitchen  cyanocobalamin 500 MCG tablet Take 1,000 mcg by mouth daily.  . diphenhydramine-acetaminophen (TYLENOL PM) 25-500 MG TABS Take 1 tablet by mouth at bedtime as needed.  Marland Kitchen esomeprazole (NEXIUM) 40 MG capsule Take 1 capsule (40 mg total) by mouth daily before breakfast.  . FeFum-FePo-FA-B Cmp-C-Zn-Mn-Cu (TANDEM PLUS) 162-115.2-1 MG CAPS Take 2 capsules by mouth daily. PCP increase dose in interim  . lisinopril-hydrochlorothiazide (PRINZIDE,ZESTORETIC) 20-12.5 MG per tablet Take 1 tablet by mouth daily.  . rosuvastatin (CRESTOR) 10 MG tablet Take 1 tablet (10 mg total) by mouth daily.  . [DISCONTINUED] FeFum-FePo-FA-B Cmp-C-Zn-Mn-Cu (TANDEM PLUS) 162-115.2-1 MG CAPS Take 1 capsule by mouth daily.  . [DISCONTINUED] lisinopril-hydrochlorothiazide (PRINZIDE,ZESTORETIC) 20-12.5 MG per tablet Take 1 tablet by mouth daily.  . [DISCONTINUED] rosuvastatin (CRESTOR) 10 MG tablet Take 1 tablet  (10 mg total) by mouth daily.  . [DISCONTINUED] ferrous gluconate (FERGON) 325 MG tablet Take 325 mg by mouth daily with breakfast.  . [DISCONTINUED] FERROUS SULFATE-VITAMIN C PO Take 1 capsule by mouth daily.    EXAM:  BP 158/72  Pulse 84  Temp(Src) 98.9 F (37.2 C) (Oral)  Ht 4\' 11"  (1.499 m)  Wt 157 lb (71.215 kg)  BMI 31.69 kg/m2  SpO2 98%  Body mass index is 31.69 kg/(m^2).  Physical Exam: Vital signs reviewed YNW:GNFA is a well-developed well-nourished alert cooperative   female who appears her stated age in no acute distress.  HEENT: normocephalic atraumatic , Eyes: PERRL EOM's full, conjunctiva clear, Nares: paten,t no deformity discharge or tenderness., Ears: no deformity EAC's clear TMs with normal landmarks. Mouth: clear OP, no lesions, edema.  Moist mucous membranes. Dentition in adequate repair. NECK: supple without masses, thyromegaly or bruits. CHEST/PULM:  Clear to auscultation and percussion breath sounds equal no wheeze , rales or rhonchi. No chest wall deformities or tenderness. CV: PMI is nondisplaced, S1 S2 no gallops, , rubs. Peripheral pulses are full without delay.No JVD .  Short sem lusb no radiation ( flow type)  Breast: normal by inspection . No dimpling, discharge, masses, tenderness or discharge . ABDOMEN: Bowel sounds normal nontender  No guard or rebound, no hepato splenomegal no CVA tenderness.  No hernia. Extremtities:  No clubbing cyanosis or edema, no acute joint swelling or redness no focal atrophy NEURO:  Oriented x3, cranial nerves 3-12 appear to be intact, no obvious focal weakness,gait within normal limits no abnormal reflexes or asymmetrical SKIN: No acute rashes normal turgor, color, no bruising or petechiae. PSYCH: Oriented, good eye contact, no obvious depression anxiety, cognition and judgment appear normal. LN: no cervical axillary inguinal adenopathy  Lab Results  Component Value Date   WBC 8.8 07/06/2013   HGB 8.6 Repeated and  verified X2.* 07/06/2013   HCT 28.4* 07/06/2013   PLT 637.0* 07/06/2013   GLUCOSE 88 07/06/2013   CHOL 131 07/06/2013   TRIG 97.0 07/06/2013   HDL 37.40* 07/06/2013   LDLDIRECT 214.5 03/28/2008   LDLCALC 74 07/06/2013   ALT 18 07/06/2013   AST 18 07/06/2013   NA 139 07/06/2013   K 4.0 07/06/2013   CL 99 07/06/2013   CREATININE 0.9 07/06/2013   BUN 14 07/06/2013   CO2 31 07/06/2013   TSH 0.84 07/06/2013    ASSESSMENT AND PLAN:  Discussed the following assessment and plan:  Visit for preventive health examination - declined flu vaccine this year utd otherwise  zostavax discussed  - Plan: POCT urinalysis dipstick  Serum albumin decreased - check urine protein - Plan: Protein / creatinine ratio, urine,  Ambulatory referral to Gastroenterology  Iron deficiency anemia - worse on one iron per day / cause  see GI notes  - Plan: Ambulatory referral to Gastroenterology, Ambulatory referral to Hematology  Unspecified essential hypertension - up today felt to be from anxiety will check readings  out of office   Other and unspecified hyperlipidemia - good on crestor  hdl still low  Scoliosis - ok to do yoga   Degenerative joint disease of low back  Patient Care Team: Burnis Medin, MD as PCP - General Lavonna Monarch, MD as Attending Physician (Dermatology) Sable Feil, MD as Attending Physician (Gastroenterology) Kristeen Miss, MD as Attending Physician (Neurosurgery) Patient Instructions  Increase the iron to twice a day for now  Need  FU with GI . To proceed with further eval? Will flag dr Sharlett Iles or his replacment Also plan getting hematology involved  . Anemia is probably making you tired  But caution with sleep aids as these can also do this . Try melltonin 3-4 hours pre sleep time. Urinalysis today.  Plan recheck cbc ibc and lfts in 1 month or so   Get shingles vaccine when  Convenient  Check into  Cost.  Check BP readings to make sure  inrange when not in office     Kendra K.  Panosh M.D.  Assess  dr Sharlett Iles  July 2014  This is a 63 year old Caucasian female with unexplained iron deficiency. Recent GI workup showed a negative colonoscopy, and a prominent hiatal hernia with mild esophagitis. Biopsy showed no evidence of Barrett's mucosa or celiac disease. She is status post hysterectomy. Patient early is on B12 and iron replacement therapy and denies any GI symptoms except for occasional discomfort in her left subxiphoid area. She currently is on Nexium 40 mg a day and vitamin C. with iron. She denies other GI or general medical problems.  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 Her Hemoccult cards as an outpatient been negative. She does not want to do pill camera exam. We will repeat her CBC and anemia profile and continue antireflux maneuvers with daily PPI therapy. We will continue to follow her when necessary basis as needed. If her hemoglobin has not risen hours following, we'll proceed with pill camera exam of her small intestine.    Pre visit review using our clinic review tool, if applicable. No additional management support is needed unless otherwise documented below in the visit note.

## 2013-07-13 NOTE — Patient Instructions (Signed)
Increase the iron to twice a day for now  Need  FU with GI . To proceed with further eval? Will flag dr Sharlett Iles or his replacment Also plan getting hematology involved  . Anemia is probably making you tired  But caution with sleep aids as these can also do this . Try melltonin 3-4 hours pre sleep time. Urinalysis today.  Plan recheck cbc ibc and lfts in 1 month or so   Get shingles vaccine when  Convenient  Check into  Cost.  Check BP readings to make sure  inrange when not in office

## 2013-07-14 LAB — PROTEIN / CREATININE RATIO, URINE
CREATININE, URINE: 146.6 mg/dL
PROTEIN CREATININE RATIO: 0.15 — AB (ref <0.15)
Total Protein, Urine: 22 mg/dL

## 2013-07-16 ENCOUNTER — Telehealth: Payer: Self-pay | Admitting: Internal Medicine

## 2013-07-16 ENCOUNTER — Encounter: Payer: Self-pay | Admitting: Internal Medicine

## 2013-07-16 NOTE — Telephone Encounter (Signed)
Relevant patient education assigned to patient using Emmi. ° °

## 2013-07-17 ENCOUNTER — Telehealth: Payer: Self-pay | Admitting: Internal Medicine

## 2013-07-17 NOTE — Telephone Encounter (Signed)
C/D 07/17/13 for appt. 07/25/13

## 2013-07-17 NOTE — Telephone Encounter (Signed)
S/W PATIENT AND GAVE NEW PATIENT APPT FOR 03/11 @ 11/DR. MOHAMED.  [place] PACKET MAILED.

## 2013-07-23 ENCOUNTER — Ambulatory Visit (INDEPENDENT_AMBULATORY_CARE_PROVIDER_SITE_OTHER): Payer: BC Managed Care – PPO | Admitting: Family Medicine

## 2013-07-23 ENCOUNTER — Encounter: Payer: Self-pay | Admitting: Family Medicine

## 2013-07-23 ENCOUNTER — Telehealth: Payer: Self-pay | Admitting: Family Medicine

## 2013-07-23 VITALS — BP 146/66 | HR 124 | Temp 98.2°F | Resp 16 | Ht 59.0 in | Wt 156.0 lb

## 2013-07-23 DIAGNOSIS — H8110 Benign paroxysmal vertigo, unspecified ear: Secondary | ICD-10-CM

## 2013-07-23 NOTE — Telephone Encounter (Signed)
Pt notified that she will need to be seen.  Spoke to Miles who will schedule her appt.

## 2013-07-23 NOTE — Telephone Encounter (Signed)
Needs to be seen by a provider who has availability

## 2013-07-23 NOTE — Progress Notes (Signed)
OFFICE NOTE  07/23/2013  CC:  Chief Complaint  Patient presents with  . Dizziness    last night     HPI: Patient is a 63 y.o. Caucasian female who is here for dizziness. Describes room/everything spinning upon turning/rolling to get out of bed this morning, nauseated and then vomited x 1.  Episode lasted about 2 min.  No headaches, no focal weakness. Has never had this before.   Undergoing eval for iron def anemia w/out sign of GI bleeding and no improving with oral iron-to see hematologist soon. No ringing in ears or hearing deficit.  No recent fever or GE illness.  Drinks water well.  Pertinent PMH:  Past Medical History  Diagnosis Date  . Anemia     years ago  . Transfusion history     before her hysterectomy  . Anxiety   . Hyperlipidemia   . Hypertension   . Spinal stenosis   . Spondylosis     degenerative adult  . Bulging lumbar disc   . MONONUCLEOSIS 08/14/2007    Qualifier: Diagnosis of  Problem Stop Reason: Removed By: Hulan Saas, CMA (AAMA), [place] Skye    Past Surgical History  Procedure Laterality Date  . Inguinal hernia repair      at birth  . Cesarean section      x 2  . Appendectomy    . Abdominal hysterectomy      bleeding fibroid tumors has ovaries  . Vein protrusion in head as a child    . Blood transfusion from vaginal bleeding     History   Social History Narrative   No ets   Regular exercise- thinking about yoga  As per dr Ellene Route.    HH of 2    Pet dog puppy 6 months    Good sleep   G2P2    MEDS:  Outpatient Prescriptions Prior to Visit  Medication Sig Dispense Refill  . cholecalciferol (VITAMIN D) 1000 UNITS tablet Take 1,000 Units by mouth daily.      Marland Kitchen esomeprazole (NEXIUM) 40 MG capsule Take 1 capsule (40 mg total) by mouth daily before breakfast.  30 capsule  11  . FeFum-FePo-FA-B Cmp-C-Zn-Mn-Cu (TANDEM PLUS) 162-115.2-1 MG CAPS Take 2 capsules by mouth daily. PCP increase dose in interim  60 each  11  . lisinopril-hydrochlorothiazide  (PRINZIDE,ZESTORETIC) 20-12.5 MG per tablet Take 1 tablet by mouth daily.  90 tablet  3  . rosuvastatin (CRESTOR) 10 MG tablet Take 1 tablet (10 mg total) by mouth daily.  90 tablet  3  . cyanocobalamin 500 MCG tablet Take 1,000 mcg by mouth daily.      . diphenhydramine-acetaminophen (TYLENOL PM) 25-500 MG TABS Take 1 tablet by mouth at bedtime as needed.       No facility-administered medications prior to visit.    PE: Blood pressure 146/66, pulse 124, temperature 98.2 F (36.8 C), temperature source Oral, resp. rate 16, height 4\' 11"  (1.499 m), weight 156 lb (70.761 kg), SpO2 99.00%. Gen: Alert, well appearing.  Patient is oriented to person, place, time, and situation. No signif pallor.  No jaundice. ENT: Ears: EACs clear, normal epithelium.  TMs with good light reflex and landmarks bilaterally.  Eyes: no injection, icteris, swelling, or exudate.  EOMI, PERRLA. Nose: no drainage or turbinate edema/swelling.  No injection or focal lesion.  Mouth: lips without lesion/swelling.  Oral mucosa pink and moist.  Dentition intact and without obvious caries or gingival swelling.  Oropharynx without erythema, exudate, or swelling.  CV: Regular rhythm, tachy to 120, 1/6 syst flow murmur, S1 and S2 normal.  No r/g.   LUNGS: CTA bilat, nonlabored resps, good aeration in all lung fields. Neuro: CN 2-12 intact bilaterally, strength 5/5 in proximal and distal upper extremities and lower extremities bilaterally.  No sensory deficits.  No tremor.  No disdiadochokinesis.  No ataxia.  Upper extremity and lower extremity DTRs symmetric.  No pronator drift. Dix halpike maneuver and head thrust maneuver both neg/normal.   IMPRESSION AND PLAN:  BPPV: could not reproduce in office today. I do not think this has anything to do with her current anemia. Reassured pt, gave Epley's maneuvers handout for home treatment of BPPV. No driving until she has had 24h period w/out any vertigo episodes. Signs/symptoms to call  or return for were reviewed and pt expressed understanding.  An After Visit Summary was printed and given to the patient.  FOLLOW UP: prn

## 2013-07-23 NOTE — Telephone Encounter (Signed)
Pt left a message on my machine that she was feeling dizzy.  I called the pt back and spoke to her.  She states that she doesn't feel well.  She was dizzy once last night and twice this morning.  She has stayed in bed this morning and is doing okay.  There has been no change in her medication.  She is concerned that this may be attributed to her anemia.  Explained that she may need to come in for an appointment.  She will have her husband bring her.  Please advise.  Thanks!

## 2013-07-23 NOTE — Progress Notes (Signed)
Pre visit review using our clinic review tool, if applicable. No additional management support is needed unless otherwise documented below in the visit note. 

## 2013-07-24 ENCOUNTER — Other Ambulatory Visit: Payer: Self-pay | Admitting: *Deleted

## 2013-07-24 DIAGNOSIS — D509 Iron deficiency anemia, unspecified: Secondary | ICD-10-CM

## 2013-07-25 ENCOUNTER — Ambulatory Visit (HOSPITAL_BASED_OUTPATIENT_CLINIC_OR_DEPARTMENT_OTHER): Payer: BC Managed Care – PPO | Admitting: Internal Medicine

## 2013-07-25 ENCOUNTER — Ambulatory Visit: Payer: BC Managed Care – PPO

## 2013-07-25 ENCOUNTER — Other Ambulatory Visit: Payer: Self-pay | Admitting: Internal Medicine

## 2013-07-25 ENCOUNTER — Encounter: Payer: Self-pay | Admitting: Internal Medicine

## 2013-07-25 ENCOUNTER — Other Ambulatory Visit (HOSPITAL_BASED_OUTPATIENT_CLINIC_OR_DEPARTMENT_OTHER): Payer: BC Managed Care – PPO

## 2013-07-25 ENCOUNTER — Telehealth: Payer: Self-pay | Admitting: *Deleted

## 2013-07-25 ENCOUNTER — Telehealth: Payer: Self-pay | Admitting: Internal Medicine

## 2013-07-25 VITALS — BP 139/58 | HR 103 | Temp 98.7°F | Resp 18 | Ht 59.0 in | Wt 156.8 lb

## 2013-07-25 DIAGNOSIS — D509 Iron deficiency anemia, unspecified: Secondary | ICD-10-CM

## 2013-07-25 LAB — CBC WITH DIFFERENTIAL/PLATELET
BASO%: 0.9 % (ref 0.0–2.0)
Basophils Absolute: 0.1 10*3/uL (ref 0.0–0.1)
EOS ABS: 0.1 10*3/uL (ref 0.0–0.5)
EOS%: 1.4 % (ref 0.0–7.0)
HEMATOCRIT: 28.6 % — AB (ref 34.8–46.6)
HGB: 8.8 g/dL — ABNORMAL LOW (ref 11.6–15.9)
LYMPH%: 22.9 % (ref 14.0–49.7)
MCH: 21.4 pg — ABNORMAL LOW (ref 25.1–34.0)
MCHC: 30.7 g/dL — ABNORMAL LOW (ref 31.5–36.0)
MCV: 69.8 fL — AB (ref 79.5–101.0)
MONO#: 0.8 10*3/uL (ref 0.1–0.9)
MONO%: 8.6 % (ref 0.0–14.0)
NEUT#: 6.3 10*3/uL (ref 1.5–6.5)
NEUT%: 66.2 % (ref 38.4–76.8)
Platelets: 711 10*3/uL — ABNORMAL HIGH (ref 145–400)
RBC: 4.09 10*6/uL (ref 3.70–5.45)
RDW: 18.1 % — ABNORMAL HIGH (ref 11.2–14.5)
WBC: 9.6 10*3/uL (ref 3.9–10.3)
lymph#: 2.2 10*3/uL (ref 0.9–3.3)

## 2013-07-25 LAB — COMPREHENSIVE METABOLIC PANEL (CC13)
ALT: 25 U/L (ref 0–55)
ANION GAP: 12 meq/L — AB (ref 3–11)
AST: 19 U/L (ref 5–34)
Albumin: 2.6 g/dL — ABNORMAL LOW (ref 3.5–5.0)
Alkaline Phosphatase: 129 U/L (ref 40–150)
BILIRUBIN TOTAL: 0.46 mg/dL (ref 0.20–1.20)
BUN: 15.2 mg/dL (ref 7.0–26.0)
CO2: 31 mEq/L — ABNORMAL HIGH (ref 22–29)
CREATININE: 0.9 mg/dL (ref 0.6–1.1)
Calcium: 9.2 mg/dL (ref 8.4–10.4)
Chloride: 100 mEq/L (ref 98–109)
GLUCOSE: 162 mg/dL — AB (ref 70–140)
Potassium: 3.1 mEq/L — ABNORMAL LOW (ref 3.5–5.1)
SODIUM: 143 meq/L (ref 136–145)
TOTAL PROTEIN: 7.3 g/dL (ref 6.4–8.3)

## 2013-07-25 NOTE — Progress Notes (Signed)
[place] Telephone:([phone]) [phone]   Fax:(336) [phone]  CONSULT NOTE  REFERRING PHYSICIAN: Dr. Shanon Ace  REASON FOR CONSULTATION:  63 years old white female with persistent iron deficiency anemia  HPI Kendra Mcfarland is a 63 y.o. female with a past medical history significant for hypertension, dyslipidemia, anxiety, persistent anemia, history of mononucleosis as well as history of spinal stenosis. The patient was seen recently by her primary care physician and CBC on 05/05/2014 showed low hemoglobin of 8.6 and hematocrit 28.4%. MCV was 71.7. The patient mentions that her anemia started almost one year ago. On 06/09/2011 she has normal hemoglobin of 13.0 and hematocrit 39.2% back on 06/06/2012 her hemoglobin dropped to 10.2 with hematocrit of 31.8%. She was started on treatment with tandem plus since October of 2014 but no significant improvement in her anemia. She was evaluated by Dr. Sharlett Iles with upper endoscopy and colonoscopy which were unremarkable. Stool for Hemoccult was negative. The patient is considered for capsule endoscopy by Dr. Sharlett Iles.  She has been complaining of increasing fatigue and weakness. She sleeps a lot. She also has shortness breath with exertion. She denied having any bleeding issues, bruises or ecchymosis.  She denied having any significant chest pain, cough or hemoptysis. She denied having any nausea or vomiting or change in her bowel movement. She has no significant weight loss or night sweats. Family history significant for her father who had heart attack at age 87 and mother age 74 and still alive. The patient is married and has 2 children. She used to work as a Pharmacist, hospital and currently a housewife. She has a history of smoking but quit in 1996. No history of alcohol or drug abuse. HPI  Past Medical History  Diagnosis Date  . Anemia     years ago  . Transfusion history     before her hysterectomy  . Anxiety   . Hyperlipidemia   .  Hypertension   . Spinal stenosis   . Spondylosis     degenerative adult  . Bulging lumbar disc   . MONONUCLEOSIS 08/14/2007    Qualifier: Diagnosis of  Problem Stop Reason: Removed By: Hulan Saas, CMA (AAMA), Quita Skye     Past Surgical History  Procedure Laterality Date  . Inguinal hernia repair      at birth  . Cesarean section      x 2  . Appendectomy    . Abdominal hysterectomy      bleeding fibroid tumors has ovaries  . Vein protrusion in head as a child    . Blood transfusion from vaginal bleeding      Family History  Problem Relation Age of Onset  . Hypertension Mother   . Arthritis Mother   . Hyperlipidemia Mother   . Memory loss Mother 72    short term  . Other Mother 65    angioplasty  . Arthritis Father   . Hypertension Father   . Heart attack Father   . Hyperthyroidism Sister   . Anemia Sister   . Hyperlipidemia Sister   . Hypothyroidism Sister   . Colon cancer Neg Hx     Social History History  Substance Use Topics  . Smoking status: Former Smoker    Quit date: 05/17/1994  . Smokeless tobacco: Never Used  . Alcohol Use: Yes     Comment: rare    Allergies  Allergen Reactions  . Tramadol Nausea And Vomiting  . Hydrocodone   . Naproxen Sodium  REACTION: rash/blisters  Can take advil   . Penicillins   . Sulfonamide Derivatives     REACTION: rash/blisters/ dypsnea    Current Outpatient Prescriptions  Medication Sig Dispense Refill  . cholecalciferol (VITAMIN D) 1000 UNITS tablet Take 1,000 Units by mouth daily.      . cyanocobalamin 500 MCG tablet Take 1,000 mcg by mouth daily.      Marland Kitchen esomeprazole (NEXIUM) 40 MG capsule Take 1 capsule (40 mg total) by mouth daily before breakfast.  30 capsule  11  . FeFum-FePo-FA-B Cmp-C-Zn-Mn-Cu (TANDEM PLUS) 162-115.2-1 MG CAPS Take 2 capsules by mouth daily. PCP increase dose in interim  60 each  11  . lisinopril-hydrochlorothiazide (PRINZIDE,ZESTORETIC) 20-12.5 MG per tablet Take 1 tablet by mouth daily.   90 tablet  3  . rosuvastatin (CRESTOR) 10 MG tablet Take 1 tablet (10 mg total) by mouth daily.  90 tablet  3  . diphenhydramine-acetaminophen (TYLENOL PM) 25-500 MG TABS Take 1 tablet by mouth at bedtime as needed.       No current facility-administered medications for this visit.    Review of Systems  Constitutional: positive for fatigue Eyes: negative Ears, nose, mouth, throat, and face: negative Respiratory: negative Cardiovascular: negative Gastrointestinal: negative Genitourinary:negative Integument/breast: negative Hematologic/lymphatic: negative Musculoskeletal:negative Neurological: negative Behavioral/Psych: negative Endocrine: negative Allergic/Immunologic: negative  Physical Exam  [id number], healthy, no distress, well nourished and well developed SKIN: skin color, texture, turgor are normal, no rashes or significant lesions HEAD: Normocephalic, No masses, lesions, tenderness or abnormalities EYES: normal, PERRLA EARS: External ears normal, Canals clear OROPHARYNX:no exudate, no erythema and lips, buccal mucosa, and tongue normal  NECK: supple, no adenopathy, no JVD LYMPH:  no palpable lymphadenopathy BREAST:not examined LUNGS: clear to auscultation , and palpation HEART: regular rate & rhythm, no murmurs and no gallops ABDOMEN:abdomen soft, non-tender, normal bowel sounds and no masses or organomegaly BACK: Back symmetric, no curvature., No CVA tenderness EXTREMITIES:no joint deformities, effusion, or inflammation, no edema, no skin discoloration  NEURO: alert & oriented x 3 with fluent speech, no focal motor/sensory deficits  PERFORMANCE STATUS: ECOG 1  LABORATORY DATA: Lab Results  Component Value Date   WBC 9.6 07/25/2013   HGB 8.8* 07/25/2013   HCT 28.6* 07/25/2013   MCV 69.8* 07/25/2013   PLT 711* 07/25/2013      Chemistry      Component Value Date/Time   NA 143 07/25/2013 1114   NA 139 07/06/2013 1018   K 3.1* 07/25/2013 1114   K 4.0 07/06/2013  1018   CL 99 07/06/2013 1018   CO2 31* 07/25/2013 1114   CO2 31 07/06/2013 1018   BUN 15.2 07/25/2013 1114   BUN 14 07/06/2013 1018   CREATININE 0.9 07/25/2013 1114   CREATININE 0.9 07/06/2013 1018      Component Value Date/Time   CALCIUM 9.2 07/25/2013 1114   CALCIUM 8.8 07/06/2013 1018   ALKPHOS 129 07/25/2013 1114   ALKPHOS 98 07/06/2013 1018   AST 19 07/25/2013 1114   AST 18 07/06/2013 1018   ALT 25 07/25/2013 1114   ALT 18 07/06/2013 1018   BILITOT 0.46 07/25/2013 1114   BILITOT 0.6 07/06/2013 1018       RADIOGRAPHIC STUDIES: No results found.  ASSESSMENT: This is a very pleasant 63 years old white female with history of iron deficiency microcytic anemia of unclear etiology at this point but could be secondary to GI blood loss versus malabsorption. The patient has been on treatment with oral iron tablets for the  last 6 months with no significant improvement in her condition.  PLAN: I have a lengthy discussion with the patient and her husband today about her current condition and treatment options. I ordered several other studies to rule out any etiology for her anemia. Have a repeat CBC, comprehensive metabolic panel, LDH, serum ferritin, iron study, serum erythropoietin, serum protein electrophoreses, vitamin B 12 as well as serum folate. I discussed with the patient and treatment options including consideration of intravenous iron infusion since she has no response to the oral iron tablets. I recommended for her Feraheme 510 mg IV weekly x2. She will start the first dose of this treatment on 07/27/2013. She would come back for followup visit in 2 months for evaluation with repeat CBC, iron study and ferritin. She was advised to call immediately if she has any concerning symptoms in the interval.  The patient voices understanding of current disease status and treatment options and is in agreement with the current care plan.  All questions were answered. The patient knows to call the clinic  with any problems, questions or concerns. We can certainly see the patient much sooner if necessary.  Thank you so much for allowing me to participate in the care of Kendra Mcfarland. I will continue to follow up the patient with you and assist in her care.  I spent 40 minutes counseling the patient face to face. The total time spent in the appointment was 55 minutes.  Disclaimer: This note was dictated with voice recognition software. Similar sounding words can inadvertently be transcribed and may not be corrected upon review.   Kendra Mcfarland K. 07/25/2013, 5:21 PM

## 2013-07-25 NOTE — Telephone Encounter (Signed)
Per staff message and POF I have scheduled appts.  JMW  

## 2013-07-25 NOTE — Progress Notes (Signed)
Quick Note:  Call patient with the result and order K Dur 20 meq po qd X 7 ______ 

## 2013-07-25 NOTE — Telephone Encounter (Signed)
Gave pt appt for lab, md and iron for March and MAy 2015

## 2013-07-25 NOTE — Progress Notes (Signed)
Checked in new pt with no financial concerns. °

## 2013-07-26 ENCOUNTER — Telehealth: Payer: Self-pay | Admitting: *Deleted

## 2013-07-26 DIAGNOSIS — E876 Hypokalemia: Secondary | ICD-10-CM

## 2013-07-26 MED ORDER — POTASSIUM CHLORIDE CRYS ER 20 MEQ PO TBCR: 20.0000 meq | EXTENDED_RELEASE_TABLET | Freq: Every day | ORAL | Status: DC

## 2013-07-26 NOTE — Telephone Encounter (Signed)
Called and informed patient's husband Kendra Mcfarland) of lab results.  Also, informed patient's husband that a prescription for potassium will be sent to patients pharmacy.  Per Dr. Julien Nordmann.  Patient's husband verbalized understanding.

## 2013-07-26 NOTE — Telephone Encounter (Signed)
Message copied by Norma Fredrickson on Thu Jul 26, 2013 10:38 AM ------      Message from: Britt Bottom      Created: Thu Jul 26, 2013 10:27 AM                   ----- Message -----         From: Curt Bears, MD         Sent: 07/25/2013   6:36 PM           To: Carlton Adam, PA-C, #            Call patient with the result and order K Dur 20 meq po qd X 7 ------

## 2013-07-27 ENCOUNTER — Ambulatory Visit (HOSPITAL_BASED_OUTPATIENT_CLINIC_OR_DEPARTMENT_OTHER): Payer: BC Managed Care – PPO

## 2013-07-27 VITALS — BP 119/65 | HR 88 | Temp 97.1°F | Resp 20

## 2013-07-27 DIAGNOSIS — D509 Iron deficiency anemia, unspecified: Secondary | ICD-10-CM

## 2013-07-27 MED ORDER — FERUMOXYTOL INJECTION 510 MG/17 ML
510.0000 mg | Freq: Once | INTRAVENOUS | Status: AC
  Administered 2013-07-27: 510 mg via INTRAVENOUS
  Filled 2013-07-27: qty 17

## 2013-07-27 MED ORDER — SODIUM CHLORIDE 0.9 % IV SOLN
Freq: Once | INTRAVENOUS | Status: AC
  Administered 2013-07-27: 09:00:00 via INTRAVENOUS

## 2013-07-27 NOTE — Patient Instructions (Signed)

## 2013-08-03 ENCOUNTER — Ambulatory Visit (HOSPITAL_BASED_OUTPATIENT_CLINIC_OR_DEPARTMENT_OTHER): Payer: BC Managed Care – PPO

## 2013-08-03 VITALS — BP 126/51 | HR 88 | Temp 98.0°F | Resp 19

## 2013-08-03 DIAGNOSIS — D509 Iron deficiency anemia, unspecified: Secondary | ICD-10-CM

## 2013-08-03 MED ORDER — FERUMOXYTOL INJECTION 510 MG/17 ML
510.0000 mg | Freq: Once | INTRAVENOUS | Status: AC
  Administered 2013-08-03: 510 mg via INTRAVENOUS
  Filled 2013-08-03: qty 17

## 2013-08-03 MED ORDER — SODIUM CHLORIDE 0.9 % IV SOLN
Freq: Once | INTRAVENOUS | Status: AC
  Administered 2013-08-03: 10:00:00 via INTRAVENOUS

## 2013-08-03 NOTE — Patient Instructions (Signed)

## 2013-08-23 ENCOUNTER — Other Ambulatory Visit: Payer: Self-pay | Admitting: Family Medicine

## 2013-08-23 ENCOUNTER — Other Ambulatory Visit (INDEPENDENT_AMBULATORY_CARE_PROVIDER_SITE_OTHER): Payer: BC Managed Care – PPO

## 2013-08-23 DIAGNOSIS — E8809 Other disorders of plasma-protein metabolism, not elsewhere classified: Secondary | ICD-10-CM

## 2013-08-23 DIAGNOSIS — D509 Iron deficiency anemia, unspecified: Secondary | ICD-10-CM

## 2013-08-23 LAB — CBC WITH DIFFERENTIAL/PLATELET
Basophils Absolute: 0.1 10*3/uL (ref 0.0–0.1)
Basophils Relative: 0.6 % (ref 0.0–3.0)
Eosinophils Absolute: 0.2 10*3/uL (ref 0.0–0.7)
Eosinophils Relative: 1.9 % (ref 0.0–5.0)
HCT: 34.6 % — ABNORMAL LOW (ref 36.0–46.0)
Hemoglobin: 10.9 g/dL — ABNORMAL LOW (ref 12.0–15.0)
Lymphocytes Relative: 21.1 % (ref 12.0–46.0)
Lymphs Abs: 2.2 10*3/uL (ref 0.7–4.0)
MCHC: 31.5 g/dL (ref 30.0–36.0)
MCV: 76.4 fl — ABNORMAL LOW (ref 78.0–100.0)
Monocytes Absolute: 0.7 10*3/uL (ref 0.1–1.0)
Monocytes Relative: 6.8 % (ref 3.0–12.0)
Neutro Abs: 7.4 10*3/uL (ref 1.4–7.7)
Neutrophils Relative %: 69.6 % (ref 43.0–77.0)
Platelets: 616 10*3/uL — ABNORMAL HIGH (ref 150.0–400.0)
RBC: 4.53 Mil/uL (ref 3.87–5.11)
RDW: 24.8 % — ABNORMAL HIGH (ref 11.5–14.6)
WBC: 10.6 10*3/uL — ABNORMAL HIGH (ref 4.5–10.5)

## 2013-08-23 LAB — HEPATIC FUNCTION PANEL
ALT: 70 U/L — ABNORMAL HIGH (ref 0–35)
AST: 34 U/L (ref 0–37)
Albumin: 3.1 g/dL — ABNORMAL LOW (ref 3.5–5.2)
Alkaline Phosphatase: 113 U/L (ref 39–117)
Bilirubin, Direct: 0 mg/dL (ref 0.0–0.3)
Total Bilirubin: 0.4 mg/dL (ref 0.3–1.2)
Total Protein: 7.6 g/dL (ref 6.0–8.3)

## 2013-08-23 LAB — IBC PANEL
Iron: 23 ug/dL — ABNORMAL LOW (ref 42–145)
Saturation Ratios: 12.3 % — ABNORMAL LOW (ref 20.0–50.0)
Transferrin: 134.1 mg/dL — ABNORMAL LOW (ref 212.0–360.0)

## 2013-08-28 ENCOUNTER — Encounter: Payer: Self-pay | Admitting: Internal Medicine

## 2013-08-29 ENCOUNTER — Encounter: Payer: Self-pay | Admitting: Internal Medicine

## 2013-08-29 ENCOUNTER — Ambulatory Visit (INDEPENDENT_AMBULATORY_CARE_PROVIDER_SITE_OTHER): Payer: BC Managed Care – PPO | Admitting: Internal Medicine

## 2013-08-29 VITALS — BP 108/60 | HR 123 | Ht 59.0 in | Wt 153.0 lb

## 2013-08-29 DIAGNOSIS — K449 Diaphragmatic hernia without obstruction or gangrene: Secondary | ICD-10-CM | POA: Insufficient documentation

## 2013-08-29 DIAGNOSIS — D509 Iron deficiency anemia, unspecified: Secondary | ICD-10-CM

## 2013-08-29 DIAGNOSIS — R000 Tachycardia, unspecified: Secondary | ICD-10-CM

## 2013-08-29 NOTE — Progress Notes (Signed)
Subjective:    Patient ID: Kendra Mcfarland, female    DOB: 1950/07/11, 63 y.o.   MRN: [id number]  HPI Kendra Mcfarland is a 63 yo female with PMH of iron deficiency anemia, hiatal hernia, hypertension, hyperlipidemia, spinal stenosis, and anxiety who is seen to further evaluate persistent iron deficiency anemia. She was previous he seen by Dr. Sharlett Iles, prior to his retirement. She is here alone today and this is our first visit. She reports that she is feeling overall well, but she continues to struggle with fatigue. Her tiredness has improved after receiving 2 doses of IV iron on 07/27/2013 and 08/03/2013.  This resulted in an improvement in her hemoglobin of about 2 g. She is seeing Dr. Julien Nordmann with hematology. He has not seen any visible blood in her stool or melena. She reports she's had 2 previous FOBT which were -6 months apart. She reports a good appetite without weight loss. Bowel movements have been regular for her. She denies abdominal pain but she has a history of previous epigastric and left upper quadrant pain though this has not been the case recently. She did take oral iron for months without improvement in iron stores. She reports she's been tested for celiac disease which was negative. She denies epistaxis and gingival bleeding. She does have occasional dyspnea on exertion.  She had upper endoscopy and colonoscopy performed on 11/08/2012 by Dr. Sharlett Iles. Colonoscopy showed one tiny AVM which was not bleeding in the descending colon an external hemorrhoid. There were no polyps seen. Upper endoscopy performed on the same day showed grade a esophagitis, a large hiatal hernia, and normal stomach and duodenum. Small bowel biopsies were benign without villous atrophy or evidence of celiac disease. GE junction biopsies showed mild inflammation consistent with reflux but no Barrett's. She has never had a capsule endoscopy  Review of Systems As per history of present illness, otherwise  negative  Current Medications, Allergies, Past Medical History, Past Surgical History, Family History and Social History were reviewed in [employer] record.     Objective:   Physical Exam BP 108/60  Pulse 123  Ht 4\' 11"  (1.499 m)  Wt 153 lb (69.4 kg)  BMI 30.89 kg/m2 Constitutional: Well-developed and well-nourished. No distress. HEENT: Normocephalic and atraumatic. Oropharynx is clear and moist. No oropharyngeal exudate. Conjunctivae are normal.  No scleral icterus. Neck: Neck supple. Trachea midline. Cardiovascular: Tachycardic and regular.  Pulmonary/chest: Effort normal and breath sounds normal. No wheezing, rales or rhonchi. Abdominal: Soft, nontender, nondistended. Bowel sounds active throughout. Well healed abdominal scars.  Extremities: no clubbing, cyanosis, or edema Lymphadenopathy: No cervical adenopathy noted. Neurological: Alert and oriented to person place and time. Skin: Skin is warm and dry. No rashes noted. Psychiatric: Normal mood and affect. Behavior is normal.  Results for Kendra Mcfarland (MRN [id number]) as of 08/29/2013 10:11  Ref. Range 06/16/2012 10:59 07/26/2012 09:52 09/13/2012 09:26 11/24/2012 11:14 02/27/2013 11:03 07/06/2013 10:18 07/25/2013 11:14 08/23/2013 08:58  Iron Latest Range: 42-145 ug/dL 28 (L)  19 (L) 16 (L) 15 (L)   23 (L)  Saturation Ratios Latest Range: 20.0-50.0 % 9.3 (L)  7.4 (L) 6.0 (L) 6.1 (L)   12.3 (L)  Ferritin Latest Range: 10.0-291.0 ng/mL 46.5  51.2 119.3 156.3     Transferrin Latest Range: 212.0-360.0 mg/dL 216.2  183.3 (L) 190.1 (L) 174.8 (L)   134.1 (L)  Folate Latest Range: >5.9 ng/mL 18.8   7.8 6.6     Vitamin B-12 Latest Range: 211-911 pg/mL  191 (L)  479 >1500 (H) >1500 (H)      Results for Kendra Mcfarland (MRN [id number]) as of 08/29/2013 10:11  Ref. Range 06/16/2012 10:59 09/13/2012 09:26 11/24/2012 11:14 07/06/2013 10:18 07/25/2013 11:14 08/23/2013 08:58  WBC Latest Range: 4.5-10.5 K/uL 9.1 8.0 10.1 8.8 9.6 10.6 (H)   RBC Latest Range: 3.87-5.11 Mil/uL 4.34 4.06 4.46 3.97 4.09 4.53  Hemoglobin Latest Range: 12.0-15.0 g/dL 10.6 (L) 9.6 (L) 10.1 (L) 8.6 Repeated and verified X2. (L) 8.8 (L) 10.9 (L)  HCT Latest Range: 36.0-46.0 % 32.3 (L) 29.6 (L) 31.5 (L) 28.4 (L) 28.6 (L) 34.6 (L)  MCV Latest Range: 78.0-100.0 fl 74.5 (L) 72.9 (L) 70.6 (L) 71.7 (L) 69.8 (L) 76.4 (L)  MCH Latest Range: 25.1-34.0 pg     21.4 (L)   MCHC Latest Range: 30.0-36.0 g/dL 32.6 32.2 32.1 30.2 30.7 (L) 31.5  RDW Latest Range: 11.5-14.6 % 16.1 (H) 17.4 (H) 18.1 (H) 19.2 (H) 18.1 (H) 24.8 (H)  Platelets Latest Range: 150.0-400.0 K/uL 482.0 (H) 483.0 (H) 586.0 (H) 637.0 (H) 711 (H) 616.0 (H)   CMP     Component Value Date/Time   NA 143 07/25/2013 1114   NA 139 07/06/2013 1018   K 3.1* 07/25/2013 1114   K 4.0 07/06/2013 1018   CL 99 07/06/2013 1018   CO2 31* 07/25/2013 1114   CO2 31 07/06/2013 1018   GLUCOSE 162* 07/25/2013 1114   GLUCOSE 88 07/06/2013 1018   BUN 15.2 07/25/2013 1114   BUN 14 07/06/2013 1018   CREATININE 0.9 07/25/2013 1114   CREATININE 0.9 07/06/2013 1018   CALCIUM 9.2 07/25/2013 1114   CALCIUM 8.8 07/06/2013 1018   PROT 7.6 08/23/2013 0858   PROT 7.3 07/25/2013 1114   ALBUMIN 3.1* 08/23/2013 0858   ALBUMIN 2.6* 07/25/2013 1114   AST 34 08/23/2013 0858   AST 19 07/25/2013 1114   ALT 70* 08/23/2013 0858   ALT 25 07/25/2013 1114   ALKPHOS 113 08/23/2013 0858   ALKPHOS 129 07/25/2013 1114   BILITOT 0.4 08/23/2013 0858   BILITOT 0.46 07/25/2013 1114   GFRNONAA 53.93 09/09/2009 0902   GFRAA 73 06/05/2008 0834      Assessment & Plan:  63 yo female with PMH of iron deficiency anemia, hiatal hernia, hypertension, hyperlipidemia, spinal stenosis, and anxiety who is seen to further evaluate persistent iron deficiency anemia.   1.  Persistent iron deficiency anemia -- I am not convinced that she doesn't have some element of GI blood loss. Certainly her hiatal hernia but her risk for Cameron's lesions, and I have recommended repeating the  upper endoscopy at this time to evaluate and rule out Cameron's erosions. I also recommend proceeding the video capsule endoscopy to examine the remaining small bowel and rule out angiodysplasias and other sources for chronic GI blood loss such as inflammation. We discussed the test today including the risks and benefits and she is agreeable to proceed. She has followup with Dr. Julien Nordmann and likely will require additional IV iron. She will continue daily PPI. Do not think a very very small AVM in the colon is the source for her blood loss.  2.  Persistent tachycardia -- I'm referring her to cardiology for evaluation of persistent tachycardia. I checked her pulse again and it was 120 resting.

## 2013-08-29 NOTE — Patient Instructions (Signed)
You have been scheduled for an endoscopy with propofol. Please follow written instructions given to you at your visit today. If you use inhalers (even only as needed), please bring them with you on the day of your procedure. Your physician has requested that you go to www.startemmi.com and enter the access code given to you at your visit today. This web site gives a general overview about your procedure. However, you should still follow specific instructions given to you by our office regarding your preparation for the procedure.   You have been referred to [facility] located on [address] [place] appointment is on 10/05/2013 at 1:45  With Dr. Aundra Dubin Please arrive at 1:15  Continue taking Nexium You can discontinue taking oral iron.   Please follow up with Oncology for IV Iron.

## 2013-09-03 ENCOUNTER — Encounter: Payer: Self-pay | Admitting: Internal Medicine

## 2013-09-10 ENCOUNTER — Encounter: Payer: Self-pay | Admitting: Internal Medicine

## 2013-09-10 ENCOUNTER — Ambulatory Visit (INDEPENDENT_AMBULATORY_CARE_PROVIDER_SITE_OTHER): Payer: BC Managed Care – PPO | Admitting: Internal Medicine

## 2013-09-10 VITALS — BP 134/66 | HR 101 | Temp 98.2°F | Ht 59.0 in | Wt 154.0 lb

## 2013-09-10 DIAGNOSIS — R739 Hyperglycemia, unspecified: Secondary | ICD-10-CM

## 2013-09-10 DIAGNOSIS — R7309 Other abnormal glucose: Secondary | ICD-10-CM

## 2013-09-10 DIAGNOSIS — I10 Essential (primary) hypertension: Secondary | ICD-10-CM

## 2013-09-10 DIAGNOSIS — D509 Iron deficiency anemia, unspecified: Secondary | ICD-10-CM

## 2013-09-10 DIAGNOSIS — E876 Hypokalemia: Secondary | ICD-10-CM

## 2013-09-10 DIAGNOSIS — R000 Tachycardia, unspecified: Secondary | ICD-10-CM

## 2013-09-10 LAB — GLUCOSE, POCT (MANUAL RESULT ENTRY): POC Glucose: 104 mg/dl — AB (ref 70–99)

## 2013-09-10 NOTE — Progress Notes (Signed)
Chief Complaint  Patient presents with  . Follow-up    HPI: Kendra Mcfarland  comes in today for follow up of  multiple medical problems.    Has had iron infusion x 2  gi ov plan for eval .  Still taking some oral iron.   Given potassium byt hem for k 3.1  And  No ? Fu   Also to see   May 22  Dr Aundra Dubin  For elevated HR but no sx of this and then on [facility] 8th [facility] for labs and to see dr Nolon Nations. .  To try to do upper endo and then capsule endoscopy.   She is actually feeling ok  Otherwise no bleeding bruising actively   bp ok  ROS: See pertinent positives and negatives per HPI. Had episode of BPPV and resolved  With maneuvers   Past Medical History  Diagnosis Date  . Anemia     years ago  . Transfusion history     before her hysterectomy  . Anxiety   . Hyperlipidemia   . Hypertension   . Spinal stenosis   . Spondylosis     degenerative adult  . Bulging lumbar disc   . MONONUCLEOSIS 08/14/2007    Qualifier: Diagnosis of  Problem Stop Reason: Removed By: Hulan Saas, CMA (AAMA), [place] Skye Vertigo     Family History  Problem Relation Age of Onset  . Hypertension Mother   . Arthritis Mother   . Hyperlipidemia Mother   . Memory loss Mother 35    short term  . Other Mother 28    angioplasty  . Arthritis Father   . Hypertension Father   . Heart attack Father   . Hyperthyroidism Sister   . Anemia Sister   . Hyperlipidemia Sister   . Hypothyroidism Sister   . Colon cancer Neg Hx     History   Social History  . Marital Status: Married    Spouse Name: N/A    Number of Children: N/A  . Years of Education: N/A   Social History Main Topics  . Smoking status: Former Smoker    Quit date: 05/17/1994  . Smokeless tobacco: Never Used  . Alcohol Use: Yes     Comment: rare  . Drug Use: No  . Sexual Activity: None   Other Topics Concern  . None   Social History Narrative   No ets   Regular exercise- thinking about yoga  As per dr Ellene Route.      HH of 2    Pet dog puppy 6 months    Good sleep   G2P2    Outpatient Encounter Prescriptions as of 09/10/2013  Medication Sig  . cholecalciferol (VITAMIN D) 1000 UNITS tablet Take 1,000 Units by mouth daily.  . cyanocobalamin 500 MCG tablet Take 1,000 mcg by mouth daily.  Marland Kitchen esomeprazole (NEXIUM) 40 MG capsule Take 1 capsule (40 mg total) by mouth daily before breakfast.  . FeFum-FePo-FA-B Cmp-C-Zn-Mn-Cu (TANDEM PLUS) 162-115.2-1 MG CAPS Take 2 capsules by mouth daily. PCP increase dose in interim  . lisinopril-hydrochlorothiazide (PRINZIDE,ZESTORETIC) 20-12.5 MG per tablet Take 1 tablet by mouth daily.  . rosuvastatin (CRESTOR) 10 MG tablet Take 1 tablet (10 mg total) by mouth daily.    EXAM:  BP 134/66  Pulse 101  Temp(Src) 98.2 F (36.8 C) (Oral)  Ht 4\' 11"  (1.499 m)  Wt 154 lb (69.854 kg)  BMI 31.09 kg/m2  SpO2 96%  Body mass  index is 31.09 kg/(m^2).  GENERAL: vitals reviewed and listed above, alert, oriented, appears well hydrated and in no acute distress HEENT: atraumatic, conjunctiva  clear, no obvious abnormalities on inspection of external nose and ears OP : no lesion edema or exudate  NECK: no obvious masses on inspection palpation  LUNGS: clear to auscultation bilaterally, no wheezes, rales or rhonchi, good air movement CV: HRRR,no m rate 90  no clubbing cyanosis or  peripheral edema nl cap refill  Abdomen:  Sof,t normal bowel sounds without hepatosplenomegaly, no guarding rebound or masses no CVA tenderness MS: moves all extremities without noticeable focal  abnormality PSYCH: pleasant and cooperative, no obvious depression or anxiety Lab Results  Component Value Date   WBC 10.6* 08/23/2013   HGB 10.9* 08/23/2013   HCT 34.6* 08/23/2013   PLT 616.0* 08/23/2013   GLUCOSE 162* 07/25/2013   CHOL 131 07/06/2013   TRIG 97.0 07/06/2013   HDL 37.40* 07/06/2013   LDLDIRECT 214.5 03/28/2008   LDLCALC 74 07/06/2013   ALT 70* 08/23/2013   AST 34 08/23/2013   NA 143 07/25/2013   K  3.1* 07/25/2013   CL 99 07/06/2013   CREATININE 0.9 07/25/2013   BUN 15.2 07/25/2013   CO2 31* 07/25/2013   TSH 0.84 07/06/2013    ASSESSMENT AND PLAN:  Discussed the following assessment and plan:  Iron deficiency anemia, unspecified  Hyperglycemia - ok today pp levels  follow  - Plan: POC Glucose (CBG)  Unspecified essential hypertension  Elevated pulse rate - fam hx thyroid disease . have next lab check tsh free t4 and free t3   Hypokalemia - repeat pota should be done as per pending orders   Potassium   Needs fu  Anemia  Under evaluation  ? If has slow gi leak  Heme orders  Labs not in record  defiiniely needs repeat potassium  Minor liver abnormality  Repeat also  Would be in cmp order  Family hx of thyroid disease   Get tsh and free t4 adn free t3 to be sure .  -Patient advised to return or notify health care team  if symptoms worsen ,persist or new concerns arise.  Patient Instructions  Follow through with appts as planned   Dr Jerilynn Mages labs will include the potassium on CMP . But will flag him to ask for more complete thyroid tests.  Blood glucose today is  Good     Mariann Laster K. Pema Thomure M.D.  Pre visit review using our clinic review tool, if applicable. No additional management support is needed unless otherwise documented below in the visit note.

## 2013-09-10 NOTE — Patient Instructions (Addendum)
Follow through with appts as planned   Dr Jerilynn Mages labs will include the potassium on CMP . But will flag him to ask for more complete thyroid tests.  Blood glucose today is  Good

## 2013-09-11 ENCOUNTER — Telehealth: Payer: Self-pay | Admitting: Internal Medicine

## 2013-09-11 NOTE — Telephone Encounter (Signed)
Relevant patient education assigned to patient using Emmi. ° °

## 2013-09-12 ENCOUNTER — Other Ambulatory Visit: Payer: Self-pay | Admitting: Internal Medicine

## 2013-09-12 DIAGNOSIS — E876 Hypokalemia: Secondary | ICD-10-CM | POA: Insufficient documentation

## 2013-09-12 DIAGNOSIS — R5383 Other fatigue: Secondary | ICD-10-CM

## 2013-09-12 DIAGNOSIS — D539 Nutritional anemia, unspecified: Secondary | ICD-10-CM | POA: Insufficient documentation

## 2013-09-12 DIAGNOSIS — R000 Tachycardia, unspecified: Secondary | ICD-10-CM | POA: Insufficient documentation

## 2013-09-12 DIAGNOSIS — R5381 Other malaise: Secondary | ICD-10-CM

## 2013-09-12 DIAGNOSIS — R739 Hyperglycemia, unspecified: Secondary | ICD-10-CM | POA: Insufficient documentation

## 2013-09-12 DIAGNOSIS — I10 Essential (primary) hypertension: Secondary | ICD-10-CM

## 2013-09-20 ENCOUNTER — Encounter: Payer: Self-pay | Admitting: Internal Medicine

## 2013-09-21 ENCOUNTER — Other Ambulatory Visit (HOSPITAL_BASED_OUTPATIENT_CLINIC_OR_DEPARTMENT_OTHER): Payer: BC Managed Care – PPO

## 2013-09-21 DIAGNOSIS — D509 Iron deficiency anemia, unspecified: Secondary | ICD-10-CM

## 2013-09-21 DIAGNOSIS — R5381 Other malaise: Secondary | ICD-10-CM

## 2013-09-21 DIAGNOSIS — R5383 Other fatigue: Secondary | ICD-10-CM

## 2013-09-21 LAB — COMPREHENSIVE METABOLIC PANEL (CC13)
ALBUMIN: 2.7 g/dL — AB (ref 3.5–5.0)
ALT: 35 U/L (ref 0–55)
ANION GAP: 13 meq/L — AB (ref 3–11)
AST: 22 U/L (ref 5–34)
Alkaline Phosphatase: 171 U/L — ABNORMAL HIGH (ref 40–150)
BILIRUBIN TOTAL: 0.4 mg/dL (ref 0.20–1.20)
BUN: 21.5 mg/dL (ref 7.0–26.0)
CO2: 26 meq/L (ref 22–29)
Calcium: 9.9 mg/dL (ref 8.4–10.4)
Chloride: 102 mEq/L (ref 98–109)
Creatinine: 1 mg/dL (ref 0.6–1.1)
GLUCOSE: 133 mg/dL (ref 70–140)
POTASSIUM: 4.2 meq/L (ref 3.5–5.1)
SODIUM: 141 meq/L (ref 136–145)
Total Protein: 7.7 g/dL (ref 6.4–8.3)

## 2013-09-21 LAB — FERRITIN CHCC

## 2013-09-21 LAB — CBC WITH DIFFERENTIAL/PLATELET
BASO%: 0.5 % (ref 0.0–2.0)
Basophils Absolute: 0.1 10*3/uL (ref 0.0–0.1)
EOS ABS: 0.3 10*3/uL (ref 0.0–0.5)
EOS%: 2.4 % (ref 0.0–7.0)
HCT: 33.1 % — ABNORMAL LOW (ref 34.8–46.6)
HGB: 10.6 g/dL — ABNORMAL LOW (ref 11.6–15.9)
LYMPH#: 2.1 10*3/uL (ref 0.9–3.3)
LYMPH%: 19.2 % (ref 14.0–49.7)
MCH: 24.8 pg — ABNORMAL LOW (ref 25.1–34.0)
MCHC: 32 g/dL (ref 31.5–36.0)
MCV: 77.6 fL — AB (ref 79.5–101.0)
MONO#: 0.7 10*3/uL (ref 0.1–0.9)
MONO%: 6.7 % (ref 0.0–14.0)
NEUT%: 71.2 % (ref 38.4–76.8)
NEUTROS ABS: 7.8 10*3/uL — AB (ref 1.5–6.5)
Platelets: 553 10*3/uL — ABNORMAL HIGH (ref 145–400)
RBC: 4.27 10*6/uL (ref 3.70–5.45)
RDW: 22.3 % — ABNORMAL HIGH (ref 11.2–14.5)
WBC: 10.9 10*3/uL — AB (ref 3.9–10.3)

## 2013-09-21 LAB — TSH CHCC: TSH: 1.516 m(IU)/L (ref 0.308–3.960)

## 2013-09-21 LAB — IRON AND TIBC CHCC
%SAT: 12 % — AB (ref 21–57)
Iron: 21 ug/dL — ABNORMAL LOW (ref 41–142)
TIBC: 174 ug/dL — ABNORMAL LOW (ref 236–444)
UIBC: 153 ug/dL (ref 120–384)

## 2013-09-21 LAB — LACTATE DEHYDROGENASE (CC13): LDH: 137 U/L (ref 125–245)

## 2013-09-24 ENCOUNTER — Encounter: Payer: Self-pay | Admitting: Internal Medicine

## 2013-09-24 ENCOUNTER — Ambulatory Visit (HOSPITAL_BASED_OUTPATIENT_CLINIC_OR_DEPARTMENT_OTHER): Payer: BC Managed Care – PPO | Admitting: Internal Medicine

## 2013-09-24 ENCOUNTER — Telehealth: Payer: Self-pay | Admitting: Internal Medicine

## 2013-09-24 VITALS — BP 119/48 | HR 103 | Temp 99.0°F | Resp 18 | Ht 59.0 in | Wt 155.0 lb

## 2013-09-24 DIAGNOSIS — D649 Anemia, unspecified: Secondary | ICD-10-CM

## 2013-09-24 DIAGNOSIS — D509 Iron deficiency anemia, unspecified: Secondary | ICD-10-CM

## 2013-09-24 LAB — T4, FREE: Free T4: 0.99 ng/dL (ref 0.80–1.80)

## 2013-09-24 LAB — VITAMIN B12

## 2013-09-24 LAB — ERYTHROPOIETIN: Erythropoietin: 25.7 m[IU]/mL — ABNORMAL HIGH (ref 2.6–18.5)

## 2013-09-24 LAB — T3, FREE: T3, Free: 2.4 pg/mL (ref 2.3–4.2)

## 2013-09-24 NOTE — Telephone Encounter (Signed)
Gave pt appt for lab and MD for July 2015 °

## 2013-09-24 NOTE — Progress Notes (Signed)
[place] [phone]   Fax:([phone]) [place], [place] [address]  DIAGNOSIS: iron deficiency microcytic anemia of unclear etiology at this point but could be secondary to GI blood loss versus malabsorption.  PRIOR THERAPY: Feraheme infusion last dose was given on 07/27/2013.  CURRENT THERAPY: Tandem 1 capsule by mouth daily  INTERVAL HISTORY: Kendra Mcfarland 63 y.o. female returns to the clinic today for followup visit accompanied by her husband. The patient is feeling fine today with less fatigue and weakness after receiving the Feraheme infusion. She denied having any significant dizzy spells. She denied having any chest pain, shortness of breath, cough or hemoptysis. She has no significant weight loss or night sweats. She tolerated the previous Feraheme infusion fairly well. She had repeat CBC, iron study and ferritin performed recently and she is here for evaluation and discussion of her lab results.  MEDICAL HISTORY: Past Medical History  Diagnosis Date  . Anemia     years ago  . Transfusion history     before her hysterectomy  . Anxiety   . Hyperlipidemia   . Hypertension   . Spinal stenosis   . Spondylosis     degenerative adult  . Bulging lumbar disc   . MONONUCLEOSIS 08/14/2007    Qualifier: Diagnosis of  Problem Stop Reason: Removed By: Hulan Saas, CMA (AAMA), [place]   . Vertigo     ALLERGIES:  is allergic to tramadol; hydrocodone; naproxen sodium; penicillins; and sulfonamide derivatives.  MEDICATIONS:  Current Outpatient Prescriptions  Medication Sig Dispense Refill  . acetaminophen (TYLENOL) 325 MG tablet Take 650 mg by mouth every 6 (six) hours as needed.      . cholecalciferol (VITAMIN D) 1000 UNITS tablet Take 1,000 Units by mouth daily.      . cyanocobalamin 500 MCG tablet Take 1,000 mcg by mouth daily.      Marland Kitchen esomeprazole (NEXIUM) 40 MG capsule Take 1  capsule (40 mg total) by mouth daily before breakfast.  30 capsule  11  . FeFum-FePo-FA-B Cmp-C-Zn-Mn-Cu (TANDEM PLUS) 162-115.2-1 MG CAPS Take 2 capsules by mouth daily. PCP increase dose in interim  60 each  11  . lisinopril-hydrochlorothiazide (PRINZIDE,ZESTORETIC) 20-12.5 MG per tablet Take 1 tablet by mouth daily.  90 tablet  3  . rosuvastatin (CRESTOR) 10 MG tablet Take 1 tablet (10 mg total) by mouth daily.  90 tablet  3   No current facility-administered medications for this visit.    SURGICAL HISTORY:  Past Surgical History  Procedure Laterality Date  . Inguinal hernia repair      at birth  . Cesarean section      x 2  . Appendectomy    . Abdominal hysterectomy      bleeding fibroid tumors has ovaries  . Vein protrusion in head as a child    . Blood transfusion from vaginal bleeding      REVIEW OF SYSTEMS:  A comprehensive review of systems was negative.   PHYSICAL EXAMINATION: General appearance: alert, cooperative and no distress Head: Normocephalic, without obvious abnormality, atraumatic Neck: no adenopathy, no JVD, supple, symmetrical, trachea midline and thyroid not enlarged, symmetric, no tenderness/mass/nodules Lymph nodes: Cervical, supraclavicular, and axillary nodes normal. Resp: clear to auscultation bilaterally Back: symmetric, no curvature. ROM normal. No CVA tenderness. Cardio: regular rate and rhythm, S1, S2 normal, no murmur, click, rub or gallop GI: soft, non-tender; bowel sounds normal; no masses,  no organomegaly Extremities: extremities normal, atraumatic, no cyanosis or edema  ECOG PERFORMANCE STATUS: 0 - Asymptomatic  Blood pressure 119/48, pulse 103, temperature 99 F (37.2 C), temperature source Oral, resp. rate 18, height 4\' 11"  (1.499 m), weight 155 lb (70.308 kg), SpO2 100.00%.  LABORATORY DATA: Lab Results  Component Value Date   WBC 10.9* 09/21/2013   HGB 10.6* 09/21/2013   HCT 33.1* 09/21/2013   MCV 77.6* 09/21/2013   PLT 553* 09/21/2013       Chemistry      Component Value Date/Time   NA 141 09/21/2013 0938   NA 139 07/06/2013 1018   K 4.2 09/21/2013 0938   K 4.0 07/06/2013 1018   CL 99 07/06/2013 1018   CO2 26 09/21/2013 0938   CO2 31 07/06/2013 1018   BUN 21.5 09/21/2013 0938   BUN 14 07/06/2013 1018   CREATININE 1.0 09/21/2013 0938   CREATININE 0.9 07/06/2013 1018      Component Value Date/Time   CALCIUM 9.9 09/21/2013 0938   CALCIUM 8.8 07/06/2013 1018   ALKPHOS 171* 09/21/2013 0938   ALKPHOS 113 08/23/2013 0858   AST 22 09/21/2013 0938   AST 34 08/23/2013 0858   ALT 35 09/21/2013 0938   ALT 70* 08/23/2013 0858   BILITOT 0.40 09/21/2013 0938   BILITOT 0.4 08/23/2013 0858       RADIOGRAPHIC STUDIES: No results found.  ASSESSMENT AND PLAN: This is a very pleasant 63 years old white female with microcytic anemia consistent with iron deficiency status post Feraheme infusion with improvement in her hemoglobin and hematocrit in addition to the ferritin level. Her iron studies still consistent with iron deficiency.  The patient is feeling fine today. She will continue on tandem 1 capsule by mouth daily. I will see her back for followup visit in 2 months for reevaluation with repeat CBC and iron study. I may consider her for Feraheme infusion again if she continues to have iron deficiency. She was advised to call immediately she has any concerning symptoms in the interval.  The patient voices understanding of current disease status and treatment options and is in agreement with the current care plan.  All questions were answered. The patient knows to call the clinic with any problems, questions or concerns. We can certainly see the patient much sooner if necessary.  Disclaimer: This note was dictated with voice recognition software. Similar sounding words can inadvertently be transcribed and may not be corrected upon review.

## 2013-09-25 LAB — SPEP & IFE WITH QIG
ALBUMIN ELP: 40.7 % — AB (ref 55.8–66.1)
ALPHA-1-GLOBULIN: 11.4 % — AB (ref 2.9–4.9)
Alpha-2-Globulin: 17.9 % — ABNORMAL HIGH (ref 7.1–11.8)
BETA 2: 8.7 % — AB (ref 3.2–6.5)
BETA GLOBULIN: 6.9 % (ref 4.7–7.2)
Gamma Globulin: 14.4 % (ref 11.1–18.8)
IgA: 344 mg/dL (ref 69–380)
IgG (Immunoglobin G), Serum: 1170 mg/dL (ref 690–1700)
IgM, Serum: 84 mg/dL (ref 52–322)
Total Protein, Serum Electrophoresis: 7 g/dL (ref 6.0–8.3)

## 2013-09-25 LAB — FOLATE

## 2013-10-04 ENCOUNTER — Ambulatory Visit (AMBULATORY_SURGERY_CENTER): Payer: BC Managed Care – PPO | Admitting: Internal Medicine

## 2013-10-04 ENCOUNTER — Other Ambulatory Visit: Payer: Self-pay | Admitting: Gastroenterology

## 2013-10-04 ENCOUNTER — Encounter: Payer: Self-pay | Admitting: Internal Medicine

## 2013-10-04 VITALS — BP 108/66 | HR 85 | Temp 98.5°F | Resp 16 | Ht 59.0 in | Wt 153.0 lb

## 2013-10-04 DIAGNOSIS — K319 Disease of stomach and duodenum, unspecified: Secondary | ICD-10-CM

## 2013-10-04 DIAGNOSIS — D509 Iron deficiency anemia, unspecified: Secondary | ICD-10-CM

## 2013-10-04 MED ORDER — SODIUM CHLORIDE 0.9 % IV SOLN: 500.0000 mL | INTRAVENOUS | Status: DC

## 2013-10-04 NOTE — Patient Instructions (Signed)
YOU HAD AN ENDOSCOPIC PROCEDURE TODAY AT THE [facility]: Refer to the procedure report that was given to you for any specific questions about what was found during the examination.  If the procedure report does not answer your questions, please call your gastroenterologist to clarify.  If you requested that your care partner not be given the details of your procedure findings, then the procedure report has been included in a sealed envelope for you to review at your convenience later.  YOU SHOULD EXPECT: Some feelings of bloating in the abdomen. Passage of more gas than usual.  Walking can help get rid of the air that was put into your GI tract during the procedure and reduce the bloating. If you had a lower endoscopy (such as a colonoscopy or flexible sigmoidoscopy) you may notice spotting of blood in your stool or on the toilet paper. If you underwent a bowel prep for your procedure, then you may not have a normal bowel movement for a few days.  DIET: Your first meal following the procedure should be a light meal and then it is ok to progress to your normal diet.  A half-sandwich or bowl of soup is an example of a good first meal.  Heavy or fried foods are harder to digest and may make you feel nauseous or bloated.  Likewise meals heavy in dairy and vegetables can cause extra gas to form and this can also increase the bloating.  Drink plenty of fluids but you should avoid alcoholic beverages for 24 hours.  ACTIVITY: Your care partner should take you home directly after the procedure.  You should plan to take it easy, moving slowly for the rest of the day.  You can resume normal activity the day after the procedure however you should NOT DRIVE or use heavy machinery for 24 hours (because of the sedation medicines used during the test).    SYMPTOMS TO REPORT IMMEDIATELY: A gastroenterologist can be reached at any hour.  During normal business hours, 8:30 AM to 5:00 PM Monday through Friday,  call [phone].  After hours and on weekends, please call the GI answering service at [phone] who will take a message and have the physician on call contact you.  Following upper endoscopy (EGD)  Vomiting of blood or coffee ground material  New chest pain or pain under the shoulder blades  Painful or persistently difficult swallowing  New shortness of breath  Fever of 100F or higher  Black, tarry-looking stools  FOLLOW UP: If any biopsies were taken you will be contacted by phone or by letter within the next 1-3 weeks.  Call your gastroenterologist if you have not heard about the biopsies in 3 weeks.  Our staff will call the home number listed on your records the next business day following your procedure to check on you and address any questions or concerns that you may have at that time regarding the information given to you following your procedure. This is a courtesy call and so if there is no answer at the home number and we have not heard from you through the emergency physician on call, we will assume that you have returned to your regular daily activities without incident.  SIGNATURES/CONFIDENTIALITY: You and/or your care partner have signed paperwork which will be entered into your electronic medical record.  These signatures attest to the fact that that the information above on your After Visit Summary has been reviewed and is understood.  Full responsibility of   the confidentiality of this discharge information lies with you and/or your care-partner. 

## 2013-10-04 NOTE — Op Note (Signed)
[facility] [place]  [address] [place], [address]   ENDOSCOPY PROCEDURE REPORT  PATIENT: Kendra Mcfarland, Kendra Mcfarland.  MR#: [id number] BIRTHDATE: 01-21-1951 , 62  yrs. old GENDER: Female ENDOSCOPIST: Jerene Bears, MD PROCEDURE DATE:  10/04/2013 PROCEDURE:  EGD w/ biopsy ASA CLASS:     Class III INDICATIONS:  Iron deficiency anemia. MEDICATIONS: MAC sedation, administered by CRNA and propofol (Diprivan) 100mg  IV TOPICAL ANESTHETIC: none  DESCRIPTION OF PROCEDURE: After the risks benefits and alternatives of the procedure were thoroughly explained, informed consent was obtained.  The LB [id number] endoscope was introduced through the mouth and advanced to the second portion of the duodenum. Without limitations.  The instrument was slowly withdrawn as the mucosa was fully examined.   ESOPHAGUS: The mucosa of the esophagus appeared normal.  Z-line regular at 33 cm.  A 2 cm hiatal hernia was noted without Cameron's lesions or erosions.Marland Kitchen  STOMACH: The mucosa of the stomach appeared normal.  DUODENUM: The duodenal mucosa showed no abnormalities in the bulb and second portion of the duodenum.  Cold forceps biopsies were taken in the bulb and second portion.  Retroflexed views revealed a small hiatal hernia.     The scope was then withdrawn from the patient and the procedure completed.  COMPLICATIONS: There were no complications.  ENDOSCOPIC IMPRESSION: 1.   The mucosa of the esophagus appeared normal 2.   2 cm hiatal hernia 3.   The mucosa of the stomach appeared normal 4.   The duodenal mucosa showed no abnormalities in the bulb and second portion of the duodenum; biopsies  RECOMMENDATIONS: 1.  Await biopsy results 2.  Proceed to video capsule endoscopy and continue iron supplementation thereafter  eSigned:  Jerene Bears, MD 10/04/2013 10:33 AM  CC:The Patient, Curt Bears, MD, and Burnis Medin, MD

## 2013-10-04 NOTE — Progress Notes (Signed)
Stacy , CMA, in to give instruction for prep for Small Capsule.

## 2013-10-04 NOTE — Progress Notes (Signed)
Called to room to assist during endoscopic procedure.  Patient ID and intended procedure confirmed with present staff. Received instructions for my participation in the procedure from the performing physician.  

## 2013-10-05 ENCOUNTER — Ambulatory Visit (INDEPENDENT_AMBULATORY_CARE_PROVIDER_SITE_OTHER): Payer: BC Managed Care – PPO | Admitting: Cardiology

## 2013-10-05 ENCOUNTER — Telehealth: Payer: Self-pay | Admitting: *Deleted

## 2013-10-05 VITALS — BP 128/68 | HR 94 | Resp 18 | Wt 155.0 lb

## 2013-10-05 DIAGNOSIS — R000 Tachycardia, unspecified: Secondary | ICD-10-CM

## 2013-10-05 NOTE — Telephone Encounter (Signed)
  Follow up Call-  Call back number 10/04/2013 11/08/2012  Post procedure Call Back phone  # [phone]  Permission to leave phone message Yes Yes     Patient questions:  Do you have a fever, pain , or abdominal swelling? no Pain Score  0 *  Have you tolerated food without any problems? yes  Have you been able to return to your normal activities? yes  Do you have any questions about your discharge instructions: Diet   no Medications  no Follow up visit  no  Do you have questions or concerns about your Care? no  Actions: * If pain score is 4 or above: No action needed, pain <4.

## 2013-10-05 NOTE — Patient Instructions (Signed)
Your physician has recommended that you wear a holter monitor. Holter monitors are medical devices that record the heart's electrical activity. Doctors most often use these monitors to diagnose arrhythmias. Arrhythmias are problems with the speed or rhythm of the heartbeat. The monitor is a small, portable device. You can wear one while you do your normal daily activities. This is usually used to diagnose what is causing palpitations/syncope (passing out). [place] [facility] recommends that you schedule a follow-up appointment as needed with Dr Aundra Dubin.

## 2013-10-07 ENCOUNTER — Encounter: Payer: Self-pay | Admitting: Cardiology

## 2013-10-07 DIAGNOSIS — R000 Tachycardia, unspecified: Secondary | ICD-10-CM | POA: Insufficient documentation

## 2013-10-07 NOTE — Progress Notes (Signed)
Patient ID: Kendra Mcfarland, female   DOB: [birth year], 63 y.o.   MRN: [id number] PCP: Dr. Regis Bill  63 yo presents for evaluation of elevated HR.  She has no prior cardiac history. However, at [facility] appointments recently, her HR has been elevated at times (sinus tachycardia).  At her GI appointment, HR was in the 110s.  At home, her HR runs > 100 at times as well.  Thyroid indices were checked recently and have been normal.  She has not had any illnesses recently. She is not hypotensive or lightheaded with standing.  No chest pain.  She is short of breath with heavy exertion only.  She has significant anxiety, especially around physician visits.  HR is in the 90s (sinus today).    ECG: NSR, rSR' V1  Labs (2/15): LDL 74, HDL 37 Labs (5/15): K 4.2, creatinien 1.0, Hemoglobin 10.6, free T3 and T4 normal.   PMH: 1. Microcytic anemia: EGD and colonoscopy did not show a lesion to explain this.  2. HTN 3. Hyperlipidemia 4. Spinal stenosis 5. BPPV  SH: Prior smoker, quit in 1996, retired.    FH: Father with MI at 21  ROS: All systems reviewed and negative except as per HPI.   BP 128/68  Pulse 94  Resp 18  Wt 155 lb (70.308 kg) General: NAD Neck: No JVD, no thyromegaly or thyroid nodule.  Lungs: Clear to auscultation bilaterally with normal respiratory effort. CV: Nondisplaced PMI.  Heart regular S1/S2, no S3/S4, no murmur.  No peripheral edema.  No carotid bruit.  Normal pedal pulses.  Abdomen: Soft, nontender, no hepatosplenomegaly, no distention.  Skin: Intact without lesions or rashes.  Neurologic: Alert and oriented x 3.  Psych: Normal affect. Extremities: No clubbing or cyanosis.  HEENT: Normal.   Assessment/Plan: 1. Tachycardia: Suspect sinus tachycardia.  She is not tachycardic today.  She does not have orthostatic symptoms.  Thyroid indices have been normal.  Hemoglobin is low but not low enough that it ought to be causing tachycardia.  I suspect that there is an anxiety  component to her sinus tachycardia.  I will have her wear a 24 hour holter monitor to assess average HR and look for any arrhythmias.  If this is normal, no further workup.  2. HTN: BP controlled.   Larey Dresser 10/07/2013

## 2013-10-09 NOTE — Addendum Note (Signed)
Addended by: Katrine Coho on: 10/09/2013 05:30 PM   Modules accepted: Orders

## 2013-10-10 ENCOUNTER — Ambulatory Visit (INDEPENDENT_AMBULATORY_CARE_PROVIDER_SITE_OTHER): Payer: BC Managed Care – PPO | Admitting: Internal Medicine

## 2013-10-10 DIAGNOSIS — D500 Iron deficiency anemia secondary to blood loss (chronic): Secondary | ICD-10-CM

## 2013-10-10 NOTE — Progress Notes (Signed)
Pt here for capsule endo. Pt was on clear liquids for 24 hours prior to procedure per Dr. Hilarie Fredrickson. Pt stated that preps made her sick. Pt tolerated capsule endo without difficulty.   Lot #[id number] Exp:  09-2014

## 2013-10-11 ENCOUNTER — Encounter: Payer: Self-pay | Admitting: *Deleted

## 2013-10-11 ENCOUNTER — Encounter (INDEPENDENT_AMBULATORY_CARE_PROVIDER_SITE_OTHER): Payer: BC Managed Care – PPO

## 2013-10-11 DIAGNOSIS — R000 Tachycardia, unspecified: Secondary | ICD-10-CM

## 2013-10-11 NOTE — Progress Notes (Signed)
Patient ID: Kendra Mcfarland, female   DOB: 1950-09-18, 63 y.o.   MRN: [id number] E-Cardio 24 hour holter monitor applied to patient.  Currently out of EVO recorders.

## 2013-10-12 ENCOUNTER — Encounter: Payer: Self-pay | Admitting: Cardiology

## 2013-10-15 ENCOUNTER — Telehealth: Payer: Self-pay | Admitting: Internal Medicine

## 2013-10-15 ENCOUNTER — Telehealth: Payer: Self-pay | Admitting: *Deleted

## 2013-10-15 ENCOUNTER — Encounter: Payer: Self-pay | Admitting: Internal Medicine

## 2013-10-15 NOTE — Telephone Encounter (Signed)
Patient notified that if she has not passed the capsule by this Thursday that she will need to call back and we will set up a KUB at that time.  She reports she has had some constipation and wanted to know if is was ok to take a laxative. She is advised that she is ok to try OTC products of her choice for a laxative,  She will call back if she does not see the capsule by Thursday.

## 2013-10-15 NOTE — Telephone Encounter (Signed)
Dr Aundra Dubin reviewed monitor done 10/11/13  Average HR 89 with range 58-131-this is OK. No PACs, no PVCs. She does have ectopic atrial rhythm occasionally but this is not clinically relevant (not causing any problems).  Pt notified.

## 2013-10-17 ENCOUNTER — Ambulatory Visit (INDEPENDENT_AMBULATORY_CARE_PROVIDER_SITE_OTHER)
Admission: RE | Admit: 2013-10-17 | Discharge: 2013-10-17 | Disposition: A | Discharge status: Home / Self Care | Payer: BC Managed Care – PPO | Source: Ambulatory Visit | Attending: Internal Medicine | Admitting: Internal Medicine

## 2013-10-17 ENCOUNTER — Telehealth: Payer: Self-pay | Admitting: Internal Medicine

## 2013-10-17 DIAGNOSIS — T189XXA Foreign body of alimentary tract, part unspecified, initial encounter: Secondary | ICD-10-CM

## 2013-10-17 NOTE — Telephone Encounter (Signed)
Order placed and the patient is notified to come for the x-ray at her convenience

## 2013-10-25 ENCOUNTER — Telehealth: Payer: Self-pay

## 2013-10-25 NOTE — Telephone Encounter (Signed)
Patient notified of the results of the capsule endoscopy.  She will call back for any additional questions or concerns.

## 2013-10-26 ENCOUNTER — Encounter: Payer: Self-pay | Admitting: Internal Medicine

## 2013-10-29 ENCOUNTER — Other Ambulatory Visit (INDEPENDENT_AMBULATORY_CARE_PROVIDER_SITE_OTHER): Payer: BC Managed Care – PPO

## 2013-10-29 ENCOUNTER — Ambulatory Visit (INDEPENDENT_AMBULATORY_CARE_PROVIDER_SITE_OTHER): Payer: BC Managed Care – PPO | Admitting: Internal Medicine

## 2013-10-29 ENCOUNTER — Encounter: Payer: Self-pay | Admitting: Internal Medicine

## 2013-10-29 VITALS — BP 124/60 | HR 84 | Ht 59.0 in | Wt 156.6 lb

## 2013-10-29 DIAGNOSIS — D509 Iron deficiency anemia, unspecified: Secondary | ICD-10-CM

## 2013-10-29 DIAGNOSIS — K5520 Angiodysplasia of colon without hemorrhage: Secondary | ICD-10-CM | POA: Insufficient documentation

## 2013-10-29 DIAGNOSIS — K6381 Dieulafoy lesion of intestine: Secondary | ICD-10-CM | POA: Insufficient documentation

## 2013-10-29 LAB — FERRITIN: FERRITIN: 1339.2 ng/mL — AB (ref 10.0–291.0)

## 2013-10-29 LAB — CBC WITH DIFFERENTIAL/PLATELET
BASOS ABS: 0.1 10*3/uL (ref 0.0–0.1)
Basophils Relative: 0.8 % (ref 0.0–3.0)
EOS PCT: 1 % (ref 0.0–5.0)
Eosinophils Absolute: 0.1 10*3/uL (ref 0.0–0.7)
HCT: 31.6 % — ABNORMAL LOW (ref 36.0–46.0)
Hemoglobin: 10.1 g/dL — ABNORMAL LOW (ref 12.0–15.0)
LYMPHS PCT: 19.4 % (ref 12.0–46.0)
Lymphs Abs: 1.8 10*3/uL (ref 0.7–4.0)
MCHC: 32.1 g/dL (ref 30.0–36.0)
MCV: 80.1 fl (ref 78.0–100.0)
MONOS PCT: 8.5 % (ref 3.0–12.0)
Monocytes Absolute: 0.8 10*3/uL (ref 0.1–1.0)
Neutro Abs: 6.5 10*3/uL (ref 1.4–7.7)
Neutrophils Relative %: 70.3 % (ref 43.0–77.0)
PLATELETS: 520 10*3/uL — AB (ref 150.0–400.0)
RBC: 3.95 Mil/uL (ref 3.87–5.11)
RDW: 16.7 % — AB (ref 11.5–15.5)
WBC: 9.2 10*3/uL (ref 4.0–10.5)

## 2013-10-29 LAB — IBC PANEL
Iron: 18 ug/dL — ABNORMAL LOW (ref 42–145)
SATURATION RATIOS: 9.6 % — AB (ref 20.0–50.0)
Transferrin: 133.9 mg/dL — ABNORMAL LOW (ref 212.0–360.0)

## 2013-10-29 NOTE — Progress Notes (Signed)
Subjective:    Patient ID: Kendra Mcfarland, female    DOB: [birth year], 63 y.o.   MRN: [id number]  HPI Kendra Mcfarland is a 63 year old female with past medical history of iron deficiency anemia, hiatal hernia, hypertension, hyperlipidemia, spinal stenosis and anxiety who is seen in followup. She was last seen in the office on 08/29/2013 and the decision was made to repeat upper endoscopy followed by video capsule endoscopy. EGD was performed on 10/04/2013. This showed a 2 cm hiatal hernia without Cameron's erosions and normal mucosa in the stomach and small bowel. Small bowel biopsies were negative for celiac disease. Video capsule endoscopy was then performed on 10/10/2013. This is a complete study and showed scattered small bowel angiodysplasias without active bleeding. She returns today for followup.  She has not seen blood in her stool or melena. She has felt more fatigued over the last month. She is being followed by Dr. Julien Nordmann for iron deficiency anemia and received IV iron on 2 occasions. She reports increasing fatigue with dyspnea on exertion. No chest pain. No abdominal pain. She has followup with hematology in early July. Bowel movements have been regular.  She was seen by cardiology for persistent tachycardia, felt sinus tachycardia.   Review of Systems As per history of present illness, otherwise negative  Current Medications, Allergies, Past Medical History, Past Surgical History, Family History and Social History were reviewed in [employer] record.     Objective:   Physical Exam BP 124/60  Pulse 84  Ht 4\' 11"  (1.499 m)  Wt 156 lb 9.6 oz (71.033 kg)  BMI 31.61 kg/m2 Constitutional: Well-developed and well-nourished. No distress. HEENT: Normocephalic and atraumatic. Oropharynx is clear and moist. No oropharyngeal exudate. Conjunctivae are normal.  No scleral icterus. Neck: Neck supple. Trachea midline. Cardiovascular: Normal rate, regular rhythm and  intact distal pulses. No M/R/G Pulmonary/chest: Effort normal and breath sounds normal. No wheezing, rales or rhonchi. Abdominal: Soft, nontender, nondistended. Bowel sounds active throughout. There are no masses palpable. No hepatosplenomegaly. Extremities: no clubbing, cyanosis, or edema Lymphadenopathy: No cervical adenopathy noted. Neurological: Alert and oriented to person place and time. Skin: Skin is warm and dry. No rashes noted. Psychiatric: Normal mood and affect. Behavior is normal.  Results for STANA, BAYON (MRN [id number]) as of 10/29/2013 17:42  Ref. Range 06/09/2011 08:02 06/09/2012 08:07 06/16/2012 10:59 09/13/2012 09:26 11/24/2012 11:14 07/06/2013 10:18 07/25/2013 11:14 08/23/2013 08:58 09/21/2013 09:37 09/21/2013 09:38 10/29/2013 16:04  Hemoglobin Latest Range: 12.0-15.0 g/dL 13.0 10.2 (L) 10.6 (L) 9.6 (L) 10.1 (L) 8.6 Repeated and verified X2. (L) 8.8 (L) 10.9 (L) 10.6 (L)  10.1 (L)  HCT Latest Range: 36.0-46.0 % 39.2 31.8 (L) 32.3 (L) 29.6 (L) 31.5 (L) 28.4 (L) 28.6 (L) 34.6 (L) 33.1 (L)  31.6 (L)  MCV Latest Range: 78.0-100.0 fl 84.9 75.0 (L) 74.5 (L) 72.9 (L) 70.6 (L) 71.7 (L) 69.8 (L) 76.4 (L) 77.6 (L)  80.1   Results for AVEREE, HARB (MRN [id number]) as of 10/29/2013 17:42  Ref. Range 09/13/2012 09:26 11/24/2012 11:14 02/27/2013 11:03 08/23/2013 08:58 09/21/2013 09:37 09/21/2013 09:39 10/29/2013 16:04  Iron Latest Range: 42-145 ug/dL 19 (L) 16 (L) 15 (L) 23 (L) 21 (L)  18 (L)  UIBC Latest Range: 120-384 ug/dL     153    TIBC Latest Range: 236-444 ug/dL     174 (L)    %SAT Latest Range: 21-57 %     12 (L)    Saturation Ratios Latest Range: 20.0-50.0 % 7.4 (  L) 6.0 (L) 6.1 (L) 12.3 (L)   9.6 (L)  Ferritin Latest Range: 10.0-291.0 ng/mL 51.2 119.3 156.3  1,102 (H)  1339.2 (H)  Transferrin Latest Range: 212.0-360.0 mg/dL 183.3 (L) 190.1 (L) 174.8 (L) 134.1 (L)   133.9 (L)   Video capsule endoscopy reviewed and reviewed with the patient    Assessment & Plan:  63 year old female with past  medical history of iron deficiency anemia, hiatal hernia, hypertension, hyperlipidemia, spinal stenosis and anxiety who is seen in followup with new diagnosis of small bowel angioectasias.  1.  Small bowel angioectasia/IDA -- after repeat upper endoscopy and capsule endoscopy small bowel angioectasia were found.  This is likely source of her iron deficiency anemia. The question remains whether we can continue to supplement iron and maintain normal blood counts without symptomatic anemia, or whether she would need deep enteroscopy for ablation of any angioectasias that can be found.  We will repeat CBC and iron studies today. She has followup with Dr. Julien Nordmann in a few weeks. She is planning to go to [place] for about a month for vacation and to relax. I asked that she notify me if she has overt melena or rectal bleeding. I would like to see her back in 8 weeks, sooner if necessary. If deep enteroscopy is performed, we could try traditional enteroscopy here or refer to [facility] for spiral enteroscopy which would allow for examination and potential treatment of more small bowel.

## 2013-10-29 NOTE — Patient Instructions (Signed)
Your physician has requested that you go to the basement for the following lab work before leaving today: CBC, IBC, Ferritin

## 2013-10-30 ENCOUNTER — Telehealth: Payer: Self-pay | Admitting: Internal Medicine

## 2013-10-30 NOTE — Telephone Encounter (Signed)
Patient notified

## 2013-10-30 NOTE — Telephone Encounter (Signed)
Message copied by Jerene Bears on Tue Oct 30, 2013 10:45 AM ------      Message from: Curt Bears      Created: Mon Oct 29, 2013  8:05 PM       Ulice Dash,      Thank you for seeing her. I think she still has IDA and her elevated Ferritin could be reactive although usually not that high. I may consider her for Feraheme infusion again. She may also have Anemia of chronic disease on top of her iron deficiency.      Thank you.      Mohamed      ----- Message -----         From: Jerene Bears, MD         Sent: 10/29/2013   5:50 PM           To: Curt Bears, MD            Julien Nordmann,       I saw Mrs. Strohmeier in clinic today.  Her small bowel video capsule showed scattered angioectasias without active bleeding. Presumably this is the source of iron def.  She is reporting more fatigue, which improved for a few months with feraheme.  I would appreciate your opinion regarding iron studies done today. Ferritin has remained high, but percent iron sat increased but is now dropping again.  TIBC is unexpectedly low for IDA.  Do you think she needs additional IV iron? Thanks            Ulice Dash Pyrtle      [facility]         ------

## 2013-10-30 NOTE — Telephone Encounter (Signed)
Dr. Julien Nordmann to consider repeat iron infusion for Kendra Mcfarland She has follow-up with him.  Sheri, please pass along when you call her with results that Dr. Julien Nordmann is aware of recent results and will likely repeat iron infusion.

## 2013-11-14 ENCOUNTER — Telehealth: Payer: Self-pay | Admitting: *Deleted

## 2013-11-14 ENCOUNTER — Other Ambulatory Visit (HOSPITAL_BASED_OUTPATIENT_CLINIC_OR_DEPARTMENT_OTHER): Payer: BC Managed Care – PPO

## 2013-11-14 DIAGNOSIS — D509 Iron deficiency anemia, unspecified: Secondary | ICD-10-CM

## 2013-11-14 DIAGNOSIS — E876 Hypokalemia: Secondary | ICD-10-CM

## 2013-11-14 DIAGNOSIS — D649 Anemia, unspecified: Secondary | ICD-10-CM

## 2013-11-14 LAB — CBC WITH DIFFERENTIAL/PLATELET
BASO%: 1.3 % (ref 0.0–2.0)
Basophils Absolute: 0.1 10*3/uL (ref 0.0–0.1)
EOS%: 1.6 % (ref 0.0–7.0)
Eosinophils Absolute: 0.2 10*3/uL (ref 0.0–0.5)
HEMATOCRIT: 33.4 % — AB (ref 34.8–46.6)
HGB: 10.4 g/dL — ABNORMAL LOW (ref 11.6–15.9)
LYMPH%: 18 % (ref 14.0–49.7)
MCH: 25.3 pg (ref 25.1–34.0)
MCHC: 31.3 g/dL — ABNORMAL LOW (ref 31.5–36.0)
MCV: 81 fL (ref 79.5–101.0)
MONO#: 0.9 10*3/uL (ref 0.1–0.9)
MONO%: 9.5 % (ref 0.0–14.0)
NEUT#: 6.7 10*3/uL — ABNORMAL HIGH (ref 1.5–6.5)
NEUT%: 69.6 % (ref 38.4–76.8)
Platelets: 617 10*3/uL — ABNORMAL HIGH (ref 145–400)
RBC: 4.12 10*6/uL (ref 3.70–5.45)
RDW: 16.8 % — ABNORMAL HIGH (ref 11.2–14.5)
WBC: 9.6 10*3/uL (ref 3.9–10.3)
lymph#: 1.7 10*3/uL (ref 0.9–3.3)

## 2013-11-14 LAB — COMPREHENSIVE METABOLIC PANEL (CC13)
ALBUMIN: 2.4 g/dL — AB (ref 3.5–5.0)
ALT: 18 U/L (ref 0–55)
AST: 17 U/L (ref 5–34)
Alkaline Phosphatase: 133 U/L (ref 40–150)
Anion Gap: 10 mEq/L (ref 3–11)
BUN: 10.8 mg/dL (ref 7.0–26.0)
CALCIUM: 9 mg/dL (ref 8.4–10.4)
CHLORIDE: 96 meq/L — AB (ref 98–109)
CO2: 37 meq/L — AB (ref 22–29)
Creatinine: 0.9 mg/dL (ref 0.6–1.1)
Glucose: 130 mg/dl (ref 70–140)
Potassium: 2.9 mEq/L — CL (ref 3.5–5.1)
SODIUM: 143 meq/L (ref 136–145)
Total Bilirubin: 0.54 mg/dL (ref 0.20–1.20)
Total Protein: 7 g/dL (ref 6.4–8.3)

## 2013-11-14 LAB — IRON AND TIBC CHCC
%SAT: 12 % — ABNORMAL LOW (ref 21–57)
Iron: 21 ug/dL — ABNORMAL LOW (ref 41–142)
TIBC: 174 ug/dL — ABNORMAL LOW (ref 236–444)
UIBC: 153 ug/dL (ref 120–384)

## 2013-11-14 LAB — FERRITIN CHCC: FERRITIN: 837 ng/mL — AB (ref 9–269)

## 2013-11-14 MED ORDER — POTASSIUM CHLORIDE CRYS ER 20 MEQ PO TBCR: 20.0000 meq | EXTENDED_RELEASE_TABLET | Freq: Every day | ORAL | Status: DC

## 2013-11-14 NOTE — Telephone Encounter (Signed)
Left message for patient to call back regarding lab results. Sent prescription for potassium to pharmacy.  Per Dr. Julien Nordmann.

## 2013-11-14 NOTE — Progress Notes (Signed)
Quick Note:  Call patient with the result and order KDur 20 meq po qd x 10 days. ______

## 2013-11-14 NOTE — Telephone Encounter (Signed)
Message copied by Wardell Heath on Wed Nov 14, 2013  2:19 PM ------      Message from: Britt Bottom      Created: Wed Nov 14, 2013  1:45 PM                   ----- Message -----         From: Curt Bears, MD         Sent: 11/14/2013   1:33 PM           To: Carlton Adam, PA-C, #            Call patient with the result and order KDur 20 meq po qd x 10 days. ------

## 2013-11-19 ENCOUNTER — Telehealth: Payer: Self-pay | Admitting: Medical Oncology

## 2013-11-19 ENCOUNTER — Telehealth: Payer: Self-pay | Admitting: Internal Medicine

## 2013-11-19 ENCOUNTER — Other Ambulatory Visit: Payer: Self-pay | Admitting: Physician Assistant

## 2013-11-19 ENCOUNTER — Encounter: Payer: Self-pay | Admitting: Internal Medicine

## 2013-11-19 ENCOUNTER — Ambulatory Visit (HOSPITAL_BASED_OUTPATIENT_CLINIC_OR_DEPARTMENT_OTHER): Payer: BC Managed Care – PPO | Admitting: Internal Medicine

## 2013-11-19 VITALS — BP 142/57 | HR 101 | Temp 98.3°F | Resp 18 | Ht 59.0 in | Wt 153.7 lb

## 2013-11-19 DIAGNOSIS — E876 Hypokalemia: Secondary | ICD-10-CM

## 2013-11-19 DIAGNOSIS — D638 Anemia in other chronic diseases classified elsewhere: Secondary | ICD-10-CM

## 2013-11-19 DIAGNOSIS — R5383 Other fatigue: Secondary | ICD-10-CM

## 2013-11-19 DIAGNOSIS — D509 Iron deficiency anemia, unspecified: Secondary | ICD-10-CM

## 2013-11-19 DIAGNOSIS — R5381 Other malaise: Secondary | ICD-10-CM

## 2013-11-19 MED ORDER — POTASSIUM CHLORIDE CRYS ER 20 MEQ PO TBCR: 20.0000 meq | EXTENDED_RELEASE_TABLET | Freq: Every day | ORAL | Status: DC

## 2013-11-19 NOTE — Telephone Encounter (Signed)
Called in rx for kdur.

## 2013-11-19 NOTE — Telephone Encounter (Signed)
gv and printed appt sched and avs for pt for OCT. °

## 2013-11-19 NOTE — Progress Notes (Signed)
[facility] Telephone:(336) [phone]   Fax:(336) [place], [place] [address]  DIAGNOSIS: iron deficiency microcytic anemia of unclear etiology at this point but could be secondary to GI blood loss versus malabsorption.  PRIOR THERAPY: Feraheme infusion last dose was given on 07/27/2013.  CURRENT THERAPY: Tandem 1 capsule by mouth daily  INTERVAL HISTORY: Kendra Mcfarland 63 y.o. female returns to the clinic today for followup visit accompanied by her husband. The patient is feeling fine today except for persistent fatigue. She was seen recently by Dr. 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 She was on treatment with tandem 1 capsule by mouth daily but she discontinued this treatment recently because she did not see a difference in her condition. She denied having any significant dizzy spells. She denied having any chest pain, shortness of breath, cough or hemoptysis. She has no significant weight loss or night sweats. She had repeat CBC, iron study and ferritin performed recently and she is here for evaluation and discussion of her lab results.  MEDICAL HISTORY: Past Medical History  Diagnosis Date  . Anemia     years ago  . Transfusion history     before her hysterectomy  . Anxiety   . Hyperlipidemia   . Hypertension   . Spinal stenosis   . Spondylosis     degenerative adult  . Bulging lumbar disc   . MONONUCLEOSIS 08/14/2007    Qualifier: Diagnosis of  Problem Stop Reason: Removed By: Hulan Saas, CMA (AAMA), [place]   . Vertigo   . Iron disorder     excessive iron storage     ALLERGIES:  is allergic to tramadol; hydrocodone; naproxen sodium; penicillins; and sulfonamide derivatives.  MEDICATIONS:  Current Outpatient Prescriptions  Medication Sig Dispense Refill  . acetaminophen (TYLENOL) 325 MG tablet Take 650 mg by mouth every 6 (six) hours as needed.       . cholecalciferol (VITAMIN D) 1000 UNITS tablet Take 1,000 Units by mouth daily.      . cyanocobalamin 500 MCG tablet Take 1,000 mcg by mouth daily.      Marland Kitchen esomeprazole (NEXIUM) 40 MG capsule Take 1 capsule (40 mg total) by mouth daily before breakfast.  30 capsule  11  . FeFum-FePo-FA-B Cmp-C-Zn-Mn-Cu (TANDEM PLUS) 162-115.2-1 MG CAPS       . lisinopril-hydrochlorothiazide (PRINZIDE,ZESTORETIC) 20-12.5 MG per tablet Take 1 tablet by mouth daily.  90 tablet  3  . rosuvastatin (CRESTOR) 10 MG tablet Take 1 tablet (10 mg total) by mouth daily.  90 tablet  3   No current facility-administered medications for this visit.    SURGICAL HISTORY:  Past Surgical History  Procedure Laterality Date  . Inguinal hernia repair      at birth  . Cesarean section      x 2  . Appendectomy    . Abdominal hysterectomy      bleeding fibroid tumors has ovaries  . Vein protrusion in head as a child    . Blood transfusion from vaginal bleeding      REVIEW OF SYSTEMS:  A comprehensive review of systems was negative except for: Constitutional: positive for fatigue   PHYSICAL EXAMINATION: General appearance: alert, cooperative and no distress Head: Normocephalic, without obvious abnormality, atraumatic Neck: no adenopathy, no JVD, supple, symmetrical, trachea midline and thyroid not enlarged, symmetric, no tenderness/mass/nodules Lymph nodes: Cervical, supraclavicular, and axillary nodes normal.  Resp: clear to auscultation bilaterally Back: symmetric, no curvature. ROM normal. No CVA tenderness. Cardio: regular rate and rhythm, S1, S2 normal, no murmur, click, rub or gallop GI: soft, non-tender; bowel sounds normal; no masses,  no organomegaly Extremities: extremities normal, atraumatic, no cyanosis or edema  ECOG PERFORMANCE STATUS: 1 - Symptomatic but completely ambulatory  Blood pressure 142/57, pulse 101, temperature 98.3 F (36.8 C), temperature source Oral, resp. rate 18, height 4\' 11"  (1.499  m), weight 153 lb 11.2 oz (69.718 kg), SpO2 100.00%.  LABORATORY DATA: Lab Results  Component Value Date   WBC 9.6 11/14/2013   HGB 10.4* 11/14/2013   HCT 33.4* 11/14/2013   MCV 81.0 11/14/2013   PLT 617* 11/14/2013      Chemistry      Component Value Date/Time   NA 143 11/14/2013 1040   NA 139 07/06/2013 1018   K 2.9* 11/14/2013 1040   K 4.0 07/06/2013 1018   CL 99 07/06/2013 1018   CO2 37* 11/14/2013 1040   CO2 31 07/06/2013 1018   BUN 10.8 11/14/2013 1040   BUN 14 07/06/2013 1018   CREATININE 0.9 11/14/2013 1040   CREATININE 0.9 07/06/2013 1018      Component Value Date/Time   CALCIUM 9.0 11/14/2013 1040   CALCIUM 8.8 07/06/2013 1018   ALKPHOS 133 11/14/2013 1040   ALKPHOS 113 08/23/2013 0858   AST 17 11/14/2013 1040   AST 34 08/23/2013 0858   ALT 18 11/14/2013 1040   ALT 70* 08/23/2013 0858   BILITOT 0.54 11/14/2013 1040   BILITOT 0.4 08/23/2013 0858     Other lab results: The ferritin 837, serum and 21, 2 to binding capacity 174 saturation 12%  RADIOGRAPHIC STUDIES: No results found.  ASSESSMENT AND PLAN: This is a very pleasant 63 years old white female with microcytic anemia consistent with iron deficiency plus/minus anemia of chronic disease status post Feraheme infusion with improvement in her hemoglobin and hematocrit in addition to the ferritin level. Her iron studies still consistent with iron deficiency.  The patient is feeling fine today except for persistent fatigue. I discussed with the patient several options for treatment of her condition including continuous treatment with oral tablets. And she is interested in taking over-the-counter ferrous sulfate with vitamin C. I also discussed with her treatment with Aranesp for the anemia of chronic disease but she would like to hold on this option for now. I will see her back for followup visit in 3 months for reevaluation with repeat CBC and iron study.  She was advised to call immediately she has any concerning symptoms in the interval.  The  patient voices understanding of current disease status and treatment options and is in agreement with the current care plan.  All questions were answered. The patient knows to call the clinic with any problems, questions or concerns. We can certainly see the patient much sooner if necessary.  Disclaimer: This note was dictated with voice recognition software. Similar sounding words can inadvertently be transcribed and may not be corrected upon review.

## 2013-12-24 ENCOUNTER — Encounter: Payer: Self-pay | Admitting: Gastroenterology

## 2014-01-02 ENCOUNTER — Encounter: Payer: Self-pay | Admitting: Internal Medicine

## 2014-01-03 ENCOUNTER — Other Ambulatory Visit: Payer: Self-pay | Admitting: Family Medicine

## 2014-01-03 DIAGNOSIS — D649 Anemia, unspecified: Secondary | ICD-10-CM

## 2014-01-31 LAB — HM MAMMOGRAPHY

## 2014-02-14 ENCOUNTER — Other Ambulatory Visit (HOSPITAL_BASED_OUTPATIENT_CLINIC_OR_DEPARTMENT_OTHER): Payer: BC Managed Care – PPO

## 2014-02-14 ENCOUNTER — Encounter: Payer: Self-pay | Admitting: Internal Medicine

## 2014-02-14 DIAGNOSIS — D509 Iron deficiency anemia, unspecified: Secondary | ICD-10-CM

## 2014-02-14 LAB — CBC WITH DIFFERENTIAL/PLATELET
BASO%: 0.7 % (ref 0.0–2.0)
BASOS ABS: 0.1 10*3/uL (ref 0.0–0.1)
EOS%: 4.1 % (ref 0.0–7.0)
Eosinophils Absolute: 0.4 10*3/uL (ref 0.0–0.5)
HEMATOCRIT: 33.1 % — AB (ref 34.8–46.6)
HEMOGLOBIN: 10.2 g/dL — AB (ref 11.6–15.9)
LYMPH#: 2.1 10*3/uL (ref 0.9–3.3)
LYMPH%: 19.4 % (ref 14.0–49.7)
MCH: 24.4 pg — ABNORMAL LOW (ref 25.1–34.0)
MCHC: 30.7 g/dL — ABNORMAL LOW (ref 31.5–36.0)
MCV: 79.4 fL — AB (ref 79.5–101.0)
MONO#: 1 10*3/uL — ABNORMAL HIGH (ref 0.1–0.9)
MONO%: 9.5 % (ref 0.0–14.0)
NEUT#: 7.2 10*3/uL — ABNORMAL HIGH (ref 1.5–6.5)
NEUT%: 66.3 % (ref 38.4–76.8)
Platelets: 748 10*3/uL — ABNORMAL HIGH (ref 145–400)
RBC: 4.17 10*6/uL (ref 3.70–5.45)
RDW: 16.9 % — ABNORMAL HIGH (ref 11.2–14.5)
WBC: 10.9 10*3/uL — ABNORMAL HIGH (ref 3.9–10.3)

## 2014-02-14 LAB — IRON AND TIBC CHCC
%SAT: 9 % — ABNORMAL LOW (ref 21–57)
Iron: 16 ug/dL — ABNORMAL LOW (ref 41–142)
TIBC: 184 ug/dL — AB (ref 236–444)
UIBC: 168 ug/dL (ref 120–384)

## 2014-02-14 LAB — FERRITIN CHCC: Ferritin: 1131 ng/ml — ABNORMAL HIGH (ref 9–269)

## 2014-02-21 ENCOUNTER — Encounter: Payer: Self-pay | Admitting: Internal Medicine

## 2014-02-21 ENCOUNTER — Ambulatory Visit (HOSPITAL_BASED_OUTPATIENT_CLINIC_OR_DEPARTMENT_OTHER): Payer: BC Managed Care – PPO | Admitting: Internal Medicine

## 2014-02-21 ENCOUNTER — Telehealth: Payer: Self-pay | Admitting: Internal Medicine

## 2014-02-21 VITALS — BP 129/52 | HR 110 | Temp 98.5°F | Resp 18 | Ht 59.0 in | Wt 145.8 lb

## 2014-02-21 DIAGNOSIS — D539 Nutritional anemia, unspecified: Secondary | ICD-10-CM

## 2014-02-21 DIAGNOSIS — D509 Iron deficiency anemia, unspecified: Secondary | ICD-10-CM

## 2014-02-21 NOTE — Telephone Encounter (Signed)
, °

## 2014-02-21 NOTE — Progress Notes (Signed)
[facility] Telephone:(336) [phone]   Fax:(336) [place], [place] [address]  DIAGNOSIS: iron deficiency microcytic anemia of unclear etiology at this point but could be secondary to GI blood loss versus malabsorption.  PRIOR THERAPY: Feraheme infusion last dose was given on 07/27/2013.  CURRENT THERAPY: Tandem 1 capsule by mouth daily  INTERVAL HISTORY: Kendra Mcfarland 63 y.o. female returns to the clinic today for followup visit accompanied by her husband. The patient is feeling fine today except for persistent fatigue. She denied having any significant dizzy spells. She denied having any chest pain, shortness of breath, cough or hemoptysis. She has no significant weight loss or night sweats. She had repeat CBC, iron study and ferritin performed recently and she is here for evaluation and discussion of her lab results.  MEDICAL HISTORY: Past Medical History  Diagnosis Date  . Anemia     years ago  . Transfusion history     before her hysterectomy  . Anxiety   . Hyperlipidemia   . Hypertension   . Spinal stenosis   . Spondylosis     degenerative adult  . Bulging lumbar disc   . MONONUCLEOSIS 08/14/2007    Qualifier: Diagnosis of  Problem Stop Reason: Removed By: Hulan Saas, CMA (AAMA), [place]   . Vertigo   . Iron disorder     excessive iron storage     ALLERGIES:  is allergic to tramadol; hydrocodone; naproxen sodium; penicillins; and sulfonamide derivatives.  MEDICATIONS:  Current Outpatient Prescriptions  Medication Sig Dispense Refill  . acetaminophen (TYLENOL) 325 MG tablet Take 650 mg by mouth every 6 (six) hours as needed.      . cholecalciferol (VITAMIN D) 1000 UNITS tablet Take 1,000 Units by mouth daily.      . cyanocobalamin 500 MCG tablet Take 1,000 mcg by mouth daily.      Marland Kitchen esomeprazole (NEXIUM) 40 MG capsule Take 1 capsule (40 mg total) by mouth daily before  breakfast.  30 capsule  11  . FeFum-FePo-FA-B Cmp-C-Zn-Mn-Cu (TANDEM PLUS) 162-115.2-1 MG CAPS       . lisinopril-hydrochlorothiazide (PRINZIDE,ZESTORETIC) 20-12.5 MG per tablet Take 1 tablet by mouth daily.  90 tablet  3  . potassium chloride SA (K-DUR,KLOR-CON) 20 MEQ tablet Take 1 tablet (20 mEq total) by mouth daily.  10 tablet  0  . rosuvastatin (CRESTOR) 10 MG tablet Take 1 tablet (10 mg total) by mouth daily.  90 tablet  3   No current facility-administered medications for this visit.    SURGICAL HISTORY:  Past Surgical History  Procedure Laterality Date  . Inguinal hernia repair      at birth  . Cesarean section      x 2  . Appendectomy    . Abdominal hysterectomy      bleeding fibroid tumors has ovaries  . Vein protrusion in head as a child    . Blood transfusion from vaginal bleeding      REVIEW OF SYSTEMS:  A comprehensive review of systems was negative except for: Constitutional: positive for fatigue   PHYSICAL EXAMINATION: General appearance: alert, cooperative and no distress Head: Normocephalic, without obvious abnormality, atraumatic Neck: no adenopathy, no JVD, supple, symmetrical, trachea midline and thyroid not enlarged, symmetric, no tenderness/mass/nodules Lymph nodes: Cervical, supraclavicular, and axillary nodes normal. Resp: clear to auscultation bilaterally Back: symmetric, no curvature. ROM normal. No CVA tenderness. Cardio: regular rate and rhythm, S1,  S2 normal, no murmur, click, rub or gallop GI: soft, non-tender; bowel sounds normal; no masses,  no organomegaly Extremities: extremities normal, atraumatic, no cyanosis or edema  ECOG PERFORMANCE STATUS: 1 - Symptomatic but completely ambulatory  Blood pressure 129/52, pulse 110, temperature 98.5 F (36.9 C), temperature source Oral, resp. rate 18, height $RemoveBe'4\' 11"'[id number]$  (1.499 m), weight 145 lb 12.8 oz (66.134 kg), SpO2 100.00%.  LABORATORY DATA: Lab Results  Component Value Date   WBC 10.9* 02/14/2014    HGB 10.2* 02/14/2014   HCT 33.1* 02/14/2014   MCV 79.4* 02/14/2014   PLT 748* 02/14/2014      Chemistry      Component Value Date/Time   NA 143 11/14/2013 1040   NA 139 07/06/2013 1018   K 2.9* 11/14/2013 1040   K 4.0 07/06/2013 1018   CL 99 07/06/2013 1018   CO2 37* 11/14/2013 1040   CO2 31 07/06/2013 1018   BUN 10.8 11/14/2013 1040   BUN 14 07/06/2013 1018   CREATININE 0.9 11/14/2013 1040   CREATININE 0.9 07/06/2013 1018      Component Value Date/Time   CALCIUM 9.0 11/14/2013 1040   CALCIUM 8.8 07/06/2013 1018   ALKPHOS 133 11/14/2013 1040   ALKPHOS 113 08/23/2013 0858   AST 17 11/14/2013 1040   AST 34 08/23/2013 0858   ALT 18 11/14/2013 1040   ALT 70* 08/23/2013 0858   BILITOT 0.54 11/14/2013 1040   BILITOT 0.4 08/23/2013 0858     Other lab results: The ferritin 1131, serum iron 16, total iron binding capacity 184 saturation 9%  RADIOGRAPHIC STUDIES: No results found.  ASSESSMENT AND PLAN: This is a very pleasant 63 years old white female with microcytic anemia consistent with iron deficiency plus/minus anemia of chronic disease status post Feraheme infusion with improvement in her hemoglobin and hematocrit in addition to the ferritin level. Her iron studies still consistent with iron deficiency and the elevated ferritin level is most likely secondary to inflammatory process of unclear etiology. The patient is feeling fine today except for persistent fatigue. I discussed with the patient proceeding with a bone marrow biopsy and aspirate for evaluation of her condition and to rule out any other abnormality. She agreed to proceed with the bone marrow biopsy. This would be performed by interventional radiology in the next few weeks. I will see her back for followup visit in 1 month for reevaluation and discussion of her treatment options. She was advised to call immediately she has any concerning symptoms in the interval.  The patient voices understanding of current disease status and treatment options and is  in agreement with the current care plan.  All questions were answered. The patient knows to call the clinic with any problems, questions or concerns. We can certainly see the patient much sooner if necessary.  Disclaimer: This note was dictated with voice recognition software. Similar sounding words can inadvertently be transcribed and may not be corrected upon review.

## 2014-02-25 ENCOUNTER — Encounter (HOSPITAL_COMMUNITY): Payer: Self-pay | Admitting: Pharmacy Technician

## 2014-02-27 ENCOUNTER — Other Ambulatory Visit: Payer: Self-pay | Admitting: Radiology

## 2014-02-28 ENCOUNTER — Ambulatory Visit (HOSPITAL_COMMUNITY)
Admission: RE | Admit: 2014-02-28 | Discharge: 2014-02-28 | Disposition: A | Discharge status: Home / Self Care | Payer: BC Managed Care – PPO | Source: Ambulatory Visit | Attending: Internal Medicine | Admitting: Internal Medicine

## 2014-02-28 ENCOUNTER — Encounter (HOSPITAL_COMMUNITY): Payer: Self-pay

## 2014-02-28 DIAGNOSIS — D539 Nutritional anemia, unspecified: Secondary | ICD-10-CM | POA: Diagnosis not present

## 2014-02-28 LAB — PROTIME-INR
INR: 1.08 (ref 0.00–1.49)
Prothrombin Time: 14.1 seconds (ref 11.6–15.2)

## 2014-02-28 LAB — CBC
HCT: 32.6 % — ABNORMAL LOW (ref 36.0–46.0)
Hemoglobin: 10.2 g/dL — ABNORMAL LOW (ref 12.0–15.0)
MCH: 25.1 pg — ABNORMAL LOW (ref 26.0–34.0)
MCHC: 31.3 g/dL (ref 30.0–36.0)
MCV: 80.1 fL (ref 78.0–100.0)
PLATELETS: 575 10*3/uL — AB (ref 150–400)
RBC: 4.07 MIL/uL (ref 3.87–5.11)
RDW: 15.8 % — ABNORMAL HIGH (ref 11.5–15.5)
WBC: 10.9 10*3/uL — AB (ref 4.0–10.5)

## 2014-02-28 LAB — BONE MARROW EXAM

## 2014-02-28 LAB — APTT: aPTT: 36 seconds (ref 24–37)

## 2014-02-28 MED ORDER — FENTANYL CITRATE 0.05 MG/ML IJ SOLN
INTRAMUSCULAR | Status: AC | PRN
  Administered 2014-02-28 (×2): 50 ug via INTRAVENOUS

## 2014-02-28 MED ORDER — FENTANYL CITRATE 0.05 MG/ML IJ SOLN
INTRAMUSCULAR | Status: AC
  Filled 2014-02-28: qty 6

## 2014-02-28 MED ORDER — SODIUM CHLORIDE 0.9 % IV SOLN
INTRAVENOUS | Status: DC
  Administered 2014-02-28: 10:00:00 via INTRAVENOUS

## 2014-02-28 MED ORDER — MIDAZOLAM HCL 2 MG/2ML IJ SOLN
INTRAMUSCULAR | Status: AC
  Filled 2014-02-28: qty 6

## 2014-02-28 MED ORDER — MIDAZOLAM HCL 2 MG/2ML IJ SOLN
INTRAMUSCULAR | Status: AC | PRN
Start: 2014-02-28 — End: 2014-02-28
  Administered 2014-02-28 (×4): 1 mg via INTRAVENOUS

## 2014-02-28 NOTE — H&P (Signed)
Chief Complaint: "I am here for a bone marrow biopsy."  Referring Physician(s): Mohamed,Mohamed  History of Present Illness: Kendra Mcfarland is a 63 y.o. female with anemia of unclear etiology scheduled today for a bone marrow biopsy. She denies any chest pain, shortness of breath or palpitations. She denies any active signs of bleeding or excessive bruising. She denies any recent fever or chills. The patient denies any history of sleep apnea or chronic oxygen use. She has previously tolerated sedation without complications.   Past Medical History  Diagnosis Date  . Anemia     years ago  . Transfusion history     before her hysterectomy  . Anxiety   . Hyperlipidemia   . Hypertension   . Spinal stenosis   . Spondylosis     degenerative adult  . Bulging lumbar disc   . MONONUCLEOSIS 08/14/2007    Qualifier: Diagnosis of  Problem Stop Reason: Removed By: Hulan Saas, CMA (AAMA), [place]   . Vertigo   . Iron disorder     excessive iron storage     Past Surgical History  Procedure Laterality Date  . Inguinal hernia repair      at birth  . Cesarean section      x 2  . Appendectomy    . Abdominal hysterectomy      bleeding fibroid tumors has ovaries  . Vein protrusion in head as a child    . Blood transfusion from vaginal bleeding      Allergies: Tramadol; Hydrocodone; Naproxen sodium; Penicillins; and Sulfonamide derivatives  Medications: Prior to Admission medications   Medication Sig Start Date End Date Taking? Authorizing Provider  cholecalciferol (VITAMIN D) 1000 UNITS tablet Take 1,000 Units by mouth daily.   Yes Historical Provider, MD  Cyanocobalamin (VITAMIN B-12) 5000 MCG SUBL Place 1 tablet under the tongue daily.   Yes Historical Provider, MD  esomeprazole (NEXIUM) 40 MG capsule Take 1 capsule (40 mg total) by mouth daily before breakfast. 11/08/12  Yes Sable Feil, MD  lisinopril-hydrochlorothiazide (PRINZIDE,ZESTORETIC) 20-12.5 MG per tablet Take 1  tablet by mouth at bedtime.   Yes Historical Provider, MD  rosuvastatin (CRESTOR) 10 MG tablet Take 1 tablet (10 mg total) by mouth daily. 07/13/13  Yes Burnis Medin, MD  acetaminophen (TYLENOL) 325 MG tablet Take 650 mg by mouth every 6 (six) hours as needed for mild pain.     Historical Provider, MD  Multiple Vitamin (MULTIVITAMIN WITH MINERALS) TABS tablet Take 1 tablet by mouth daily.    Historical Provider, MD    Family History  Problem Relation Age of Onset  . Hypertension Mother   . Arthritis Mother   . Hyperlipidemia Mother   . Memory loss Mother 109    short term  . Other Mother 7    angioplasty  . Arthritis Father   . Hypertension Father   . Heart attack Father   . Hyperthyroidism Sister   . Anemia Sister   . Hypothyroidism Sister   . Hyperlipidemia Sister   . Colon cancer Neg Hx   . Esophageal cancer Neg Hx   . Rectal cancer Neg Hx   . Stomach cancer Neg Hx     History   Social History  . Marital Status: Married    Spouse Name: N/A    Number of Children: N/A  . Years of Education: N/A   Social History Main Topics  . Smoking status: Former Smoker    Quit date: 05/17/1994  .  Smokeless tobacco: Never Used  . Alcohol Use: Yes     Comment: rare  . Drug Use: No  . Sexual Activity: None   Other Topics Concern  . None   Social History Narrative   No ets   Regular exercise- thinking about yoga  As per dr Ellene Route.    HH of 2    Pet dog puppy 6 months    Good sleep   G2P2   Review of Systems: A 12 point ROS discussed and pertinent positives are indicated in the HPI above.  All other systems are negative.  Review of Systems  Vital Signs: BP 116/55  Pulse 102  Temp(Src) 98.7 F (37.1 C) (Oral)  Resp 18  SpO2 99%  Physical Exam  Constitutional: She is oriented to person, place, and time. She appears well-developed and well-nourished. No distress.  HENT:  Head: Normocephalic and atraumatic.  Neck: No tracheal deviation present.  Cardiovascular:  Normal rate and regular rhythm.  Exam reveals no gallop and no friction rub.   No murmur heard. Pulmonary/Chest: Effort normal and breath sounds normal. No respiratory distress. She has no wheezes. She has no rales.  Abdominal: Soft. Bowel sounds are normal. She exhibits no distension. There is no tenderness.  Neurological: She is alert and oriented to person, place, and time.  Skin: Skin is warm and dry. She is not diaphoretic.  Psychiatric: She has a normal mood and affect. Her behavior is normal. Thought content normal.    Imaging: No results found.  Labs:  CBC:  Recent Labs  10/29/13 1604 11/14/13 1040 02/14/14 1017 02/28/14 0935  WBC 9.2 9.6 10.9* 10.9*  HGB 10.1* 10.4* 10.2* 10.2*  HCT 31.6* 33.4* 33.1* 32.6*  PLT 520.0* 617* 748* 575*    COAGS:  Recent Labs  02/28/14 0935  INR 1.08  APTT 36    BMP:  Recent Labs  07/06/13 1018 07/25/13 1114 09/21/13 0938 11/14/13 1040  NA 139 143 141 143  K 4.0 3.1* 4.2 2.9*  CL 99  --   --   --   CO2 31 31* 26 37*  GLUCOSE 88 162* 133 130  BUN 14 15.2 21.5 10.8  CALCIUM 8.8 9.2 9.9 9.0  CREATININE 0.9 0.9 1.0 0.9    LIVER FUNCTION TESTS:  Recent Labs  07/25/13 1114 08/23/13 0858 09/21/13 0938 11/14/13 1040  BILITOT 0.46 0.4 0.40 0.54  AST 19 34 22 17  ALT 25 70* 35 18  ALKPHOS 129 113 171* 133  PROT 7.3 7.6 7.7 7.0  ALBUMIN 2.6* 3.1* 2.7* 2.4*   Assessment and Plan: Anemia of unclear etiology  Scheduled today for image guided bone marrow biopsy with moderate sedation Patient has been NPO, labs reviewed Risks and Benefits discussed with the patient. All of the patient's questions were answered, patient is agreeable to proceed. Consent signed and in chart.    SignedHedy Jacob 02/28/2014, 11:04 AM

## 2014-02-28 NOTE — Discharge Instructions (Signed)
Bone Marrow Aspiration, Bone Marrow Biopsy °Care After °Read the instructions outlined below and refer to this sheet in the next few weeks. These discharge instructions provide you with general information on caring for yourself after you leave the hospital. Your caregiver may also give you specific instructions. While your treatment has been planned according to the most current medical practices available, unavoidable complications occasionally occur. If you have any problems or questions after discharge, call your caregiver. °FINDING OUT THE RESULTS OF YOUR TEST °Not all test results are available during your visit. If your test results are not back during the visit, make an appointment with your caregiver to find out the results. Do not assume everything is normal if you have not heard from your caregiver or the medical facility. It is important for you to follow up on all of your test results.  °HOME CARE INSTRUCTIONS  °You have had sedation and may be sleepy or dizzy. Your thinking may not be as clear as usual. For the next 24 hours: °· Only take over-the-counter or prescription medicines for pain, discomfort, and or fever as directed by your caregiver. °· Do not drink alcohol. °· Do not smoke. °· Do not drive. °· Do not make important legal decisions. °· Do not operate heavy machinery. °· Do not care for small children by yourself. °· Keep your dressing clean and dry. You may replace dressing with a bandage after 24 hours. °· You may take a bath or shower after 24 hours. °· Use an ice pack for 20 minutes every 2 hours while awake for pain as needed. °SEEK MEDICAL CARE IF:  °· There is redness, swelling, or increasing pain at the biopsy site. °· There is pus coming from the biopsy site. °· There is drainage from a biopsy site lasting longer than one day. °· An unexplained oral temperature above 102° F (38.9° C) develops. °SEEK IMMEDIATE MEDICAL CARE IF:  °· You develop a rash. °· You have difficulty  breathing. °· You develop any reaction or side effects to medications given. °Document Released: 11/20/2004 Document Revised: 07/26/2011 Document Reviewed: 04/30/2008 °ExitCare® Patient Information ©2015 [employer]. This information is not intended to replace advice given to you by your health care provider. Make sure you discuss any questions you have with your health care provider. °Conscious Sedation °Sedation is the use of medicines to promote relaxation and relieve discomfort and anxiety. Conscious sedation is a type of sedation. Under conscious sedation you are less alert than normal but are still able to respond to instructions or stimulation. Conscious sedation is used during short medical and dental procedures. It is milder than deep sedation or general anesthesia and allows you to return to your regular activities sooner.  °LET YOUR HEALTH CARE PROVIDER KNOW ABOUT:  °· Any allergies you have. °· All medicines you are taking, including vitamins, herbs, eye drops, creams, and over-the-counter medicines. °· Use of steroids (by mouth or creams). °· Previous problems you or members of your family have had with the use of anesthetics. °· Any blood disorders you have. °· Previous surgeries you have had. °· Medical conditions you have. °· Possibility of pregnancy, if this applies. °· Use of cigarettes, alcohol, or illegal drugs. °RISKS AND COMPLICATIONS °Generally, this is a safe procedure. However, as with any procedure, problems can occur. Possible problems include: °· Oversedation. °· Trouble breathing on your own. You may need to have a breathing tube until you are awake and breathing on your own. °· Allergic reaction   to any of the medicines used for the procedure. °BEFORE THE PROCEDURE °· You may have blood tests done. These tests can help show how well your kidneys and liver are working. They can also show how well your blood clots. °· A physical exam will be done.   °· Only take medicines as directed by  your health care provider. You may need to stop taking medicines (such as blood thinners, aspirin, or nonsteroidal anti-inflammatory drugs) before the procedure.   °· Do not eat or drink at least 6 hours before the procedure or as directed by your health care provider. °· Arrange for a responsible adult, family member, or friend to take you home after the procedure. He or she should stay with you for at least 24 hours after the procedure, until the medicine has worn off. °PROCEDURE  °· An intravenous (IV) catheter will be inserted into one of your veins. Medicine will be able to flow directly into your body through this catheter. You may be given medicine through this tube to help prevent pain and help you relax. °· The medical or dental procedure will be done. °AFTER THE PROCEDURE °· You will stay in a recovery area until the medicine has worn off. Your blood pressure and pulse will be checked.   °·  Depending on the procedure you had, you may be allowed to go home when you can tolerate liquids and your pain is under control. °Document Released: 01/26/2001 Document Revised: 05/08/2013 Document Reviewed: 01/08/2013 °ExitCare® Patient Information ©2015 [employer]. This information is not intended to replace advice given to you by your health care provider. Make sure you discuss any questions you have with your health care provider. ° °

## 2014-02-28 NOTE — Procedures (Signed)
Interventional Radiology Procedure Note  Procedure: CT guided aspirate and core biopsy of right iliac bone Complications: None Recommendations: - Bedrest supine x 2 hrs - Hydrocodone PRN  Pain - Follow biopsy results  Signed,  Ashantee Deupree K. Millie Forde, MD   

## 2014-03-06 LAB — CHROMOSOME ANALYSIS, BONE MARROW

## 2014-03-13 ENCOUNTER — Encounter (HOSPITAL_COMMUNITY): Payer: Self-pay

## 2014-03-18 ENCOUNTER — Other Ambulatory Visit (HOSPITAL_BASED_OUTPATIENT_CLINIC_OR_DEPARTMENT_OTHER): Payer: BC Managed Care – PPO

## 2014-03-18 ENCOUNTER — Encounter: Payer: Self-pay | Admitting: Internal Medicine

## 2014-03-18 ENCOUNTER — Telehealth: Payer: Self-pay | Admitting: Internal Medicine

## 2014-03-18 ENCOUNTER — Ambulatory Visit (HOSPITAL_BASED_OUTPATIENT_CLINIC_OR_DEPARTMENT_OTHER): Payer: BC Managed Care – PPO | Admitting: Internal Medicine

## 2014-03-18 VITALS — BP 122/47 | HR 100 | Temp 98.7°F | Resp 18 | Ht 59.0 in | Wt 146.7 lb

## 2014-03-18 DIAGNOSIS — D508 Other iron deficiency anemias: Secondary | ICD-10-CM

## 2014-03-18 DIAGNOSIS — R5382 Chronic fatigue, unspecified: Secondary | ICD-10-CM

## 2014-03-18 DIAGNOSIS — D509 Iron deficiency anemia, unspecified: Secondary | ICD-10-CM

## 2014-03-18 DIAGNOSIS — D638 Anemia in other chronic diseases classified elsewhere: Secondary | ICD-10-CM

## 2014-03-18 DIAGNOSIS — D539 Nutritional anemia, unspecified: Secondary | ICD-10-CM

## 2014-03-18 DIAGNOSIS — R5383 Other fatigue: Secondary | ICD-10-CM

## 2014-03-18 LAB — CBC WITH DIFFERENTIAL/PLATELET
BASO%: 1.3 % (ref 0.0–2.0)
BASOS ABS: 0.1 10*3/uL (ref 0.0–0.1)
EOS%: 1.8 % (ref 0.0–7.0)
Eosinophils Absolute: 0.2 10*3/uL (ref 0.0–0.5)
HCT: 31.8 % — ABNORMAL LOW (ref 34.8–46.6)
HEMOGLOBIN: 9.8 g/dL — AB (ref 11.6–15.9)
LYMPH%: 18.4 % (ref 14.0–49.7)
MCH: 24.3 pg — AB (ref 25.1–34.0)
MCHC: 30.8 g/dL — AB (ref 31.5–36.0)
MCV: 78.8 fL — AB (ref 79.5–101.0)
MONO#: 1 10*3/uL — ABNORMAL HIGH (ref 0.1–0.9)
MONO%: 9.9 % (ref 0.0–14.0)
NEUT#: 7.2 10*3/uL — ABNORMAL HIGH (ref 1.5–6.5)
NEUT%: 68.6 % (ref 38.4–76.8)
Platelets: 672 10*3/uL — ABNORMAL HIGH (ref 145–400)
RBC: 4.04 10*6/uL (ref 3.70–5.45)
RDW: 16.8 % — AB (ref 11.2–14.5)
WBC: 10.4 10*3/uL — ABNORMAL HIGH (ref 3.9–10.3)
lymph#: 1.9 10*3/uL (ref 0.9–3.3)

## 2014-03-18 NOTE — Progress Notes (Signed)
[place] [phone]   Fax:([phone]) [place], [place] [address]  DIAGNOSIS: iron deficiency microcytic anemia of unclear etiology at this point but could be secondary to GI blood loss versus malabsorption.  PRIOR THERAPY: Feraheme infusion last dose was given on 07/27/2013.  CURRENT THERAPY: None  INTERVAL HISTORY: Kendra Mcfarland 63 y.o. female returns to the clinic today for followup visit accompanied by her husband. The patient is feeling fine today except for persistent fatigue. She denied having any significant dizzy spells. She denied having any chest pain, shortness of breath, cough or hemoptysis. She has no significant weight loss or night sweats. She recently had a bone marrow biopsy and aspirate performed for evaluation of her condition and she is here for discussion of the biopsy results. She was also seen at [facility] by Dr. Cheri Fowler a second opinion and they gave her similar recommendation for management of her condition.  MEDICAL HISTORY: Past Medical History  Diagnosis Date  . Anemia     years ago  . Transfusion history     before her hysterectomy  . Anxiety   . Hyperlipidemia   . Hypertension   . Spinal stenosis   . Spondylosis     degenerative adult  . Bulging lumbar disc   . MONONUCLEOSIS 08/14/2007    Qualifier: Diagnosis of  Problem Stop Reason: Removed By: Hulan Saas, CMA (AAMA), [place]   . Vertigo   . Iron disorder     excessive iron storage     ALLERGIES:  is allergic to tramadol; hydrocodone; naproxen sodium; penicillins; and sulfonamide derivatives.  MEDICATIONS:  Current Outpatient Prescriptions  Medication Sig Dispense Refill  . acetaminophen (TYLENOL) 325 MG tablet Take 650 mg by mouth every 6 (six) hours as needed for mild pain.     . cholecalciferol (VITAMIN D) 1000 UNITS tablet Take 1,000 Units by mouth daily.    .  Cyanocobalamin (VITAMIN B-12) 5000 MCG SUBL Place 1 tablet under the tongue daily.    Marland Kitchen esomeprazole (NEXIUM) 40 MG capsule Take 1 capsule (40 mg total) by mouth daily before breakfast. 30 capsule 11  . lisinopril-hydrochlorothiazide (PRINZIDE,ZESTORETIC) 20-12.5 MG per tablet Take 1 tablet by mouth at bedtime.    . Multiple Vitamin (MULTIVITAMIN WITH MINERALS) TABS tablet Take 1 tablet by mouth daily.    . rosuvastatin (CRESTOR) 10 MG tablet Take 1 tablet (10 mg total) by mouth daily. 90 tablet 3   No current facility-administered medications for this visit.    SURGICAL HISTORY:  Past Surgical History  Procedure Laterality Date  . Inguinal hernia repair      at birth  . Cesarean section      x 2  . Appendectomy    . Abdominal hysterectomy      bleeding fibroid tumors has ovaries  . Vein protrusion in head as a child    . Blood transfusion from vaginal bleeding      REVIEW OF SYSTEMS:  A comprehensive review of systems was negative except for: Constitutional: positive for fatigue   PHYSICAL EXAMINATION: General appearance: alert, cooperative and no distress Head: Normocephalic, without obvious abnormality, atraumatic Neck: no adenopathy, no JVD, supple, symmetrical, trachea midline and thyroid not enlarged, symmetric, no tenderness/mass/nodules Lymph nodes: Cervical, supraclavicular, and axillary nodes normal. Resp: clear to auscultation bilaterally Back: symmetric, no curvature. ROM normal. No CVA tenderness. Cardio: regular rate and rhythm, S1, S2  normal, no murmur, click, rub or gallop GI: soft, non-tender; bowel sounds normal; no masses,  no organomegaly Extremities: extremities normal, atraumatic, no cyanosis or edema  ECOG PERFORMANCE STATUS: 1 - Symptomatic but completely ambulatory  Blood pressure 122/47, pulse 100, temperature 98.7 F (37.1 C), temperature source Oral, resp. rate 18, height $RemoveBe'4\' 11"'orgEXkFLU$  (1.499 m), weight 146 lb 11.2 oz (66.543 kg), SpO2 100 %.  LABORATORY  DATA: Lab Results  Component Value Date   WBC 10.4* 03/18/2014   HGB 9.8* 03/18/2014   HCT 31.8* 03/18/2014   MCV 78.8* 03/18/2014   PLT 672* 03/18/2014      Chemistry      Component Value Date/Time   NA 143 11/14/2013 1040   NA 139 07/06/2013 1018   K 2.9* 11/14/2013 1040   K 4.0 07/06/2013 1018   CL 99 07/06/2013 1018   CO2 37* 11/14/2013 1040   CO2 31 07/06/2013 1018   BUN 10.8 11/14/2013 1040   BUN 14 07/06/2013 1018   CREATININE 0.9 11/14/2013 1040   CREATININE 0.9 07/06/2013 1018      Component Value Date/Time   CALCIUM 9.0 11/14/2013 1040   CALCIUM 8.8 07/06/2013 1018   ALKPHOS 133 11/14/2013 1040   ALKPHOS 113 08/23/2013 0858   AST 17 11/14/2013 1040   AST 34 08/23/2013 0858   ALT 18 11/14/2013 1040   ALT 70* 08/23/2013 0858   BILITOT 0.54 11/14/2013 1040   BILITOT 0.4 08/23/2013 0858     Other lab results: The ferritin 1131, serum iron 16, total iron binding capacity 184 saturation 9%  RADIOGRAPHIC STUDIES: No results found.  BONE MARROW REPORT FINAL DIAGNOSIS Diagnosis Bone Marrow, Aspirate,Biopsy, and Clot, right iliac - HYPERCELLULAR BONE MARROW WITH TRILINEAGE HEMATOPOIESIS. - SEE COMMENT. PERIPHERAL BLOOD: - NORMOCYTIC-NORMOCHROMIC ANEMIA. - MILD LEUKOCYTOSIS. - THROMBOCYTOSIS. Diagnosis Note The bone marrow is hypercellular for age with trilineage hematopoiesis and nonspecific changes. Significant dyspoiesis or diagnostic morphologic features of a myeloproliferative neoplasm are not appreciated. The plasma cells represent 6% of all cells with polyclonal staining pattern for kappa and lambda light chains. Correlation with cytogenetic studies is recommended. (BNS:gt, 03-15-2014) Susanne Greenhouse MD Pathologist, Electronic Signature (Case signed 03/04/2014)  ASSESSMENT AND PLAN: This is a very pleasant 63 years old white female with microcytic anemia consistent with iron deficiency plus/minus anemia of chronic disease status post Feraheme infusion  with improvement in her hemoglobin and hematocrit in addition to the ferritin level. Her iron studies still consistent with iron deficiency and the elevated ferritin level is most likely secondary to inflammatory process of unclear etiology. The patient is feeling fine today except for persistent fatigue. The bone marrow biopsy and aspirate showed no specific findings to explain her anemia I recommended for the patient to continue on observation for now. I would see her back for follow-up visit in 6 months with repeat CBC, iron study and ferritin. We may consider her in the future for imaging studies to evaluate the etiology of the inflammatory process resulting high ferritin level. She was advised to call immediately she has any concerning symptoms in the interval.  The patient voices understanding of current disease status and treatment options and is in agreement with the current care plan.  All questions were answered. The patient knows to call the clinic with any problems, questions or concerns. We can certainly see the patient much sooner if necessary.  Disclaimer: This note was dictated with voice recognition software. Similar sounding words can inadvertently be transcribed and may not be  corrected upon review.

## 2014-03-18 NOTE — Telephone Encounter (Signed)
Gave avs & cal for April/ May 2016.

## 2014-03-21 LAB — SOLUBLE TRANSFERRIN RECEPTOR: Transferrin Receptor, Soluble: 2.01 mg/L — ABNORMAL HIGH (ref 0.76–1.76)

## 2014-03-27 ENCOUNTER — Encounter: Payer: Self-pay | Admitting: Internal Medicine

## 2014-04-01 NOTE — Telephone Encounter (Signed)
Misty   Please make sure we have the informtaion whe requested and then OV to discuss ( 15 minutes ok)  Thanks [facility]

## 2014-04-03 ENCOUNTER — Ambulatory Visit (INDEPENDENT_AMBULATORY_CARE_PROVIDER_SITE_OTHER): Payer: BC Managed Care – PPO | Admitting: Internal Medicine

## 2014-04-03 VITALS — BP 148/70 | Temp 98.8°F | Wt 148.6 lb

## 2014-04-03 DIAGNOSIS — D649 Anemia, unspecified: Secondary | ICD-10-CM

## 2014-04-03 DIAGNOSIS — R5383 Other fatigue: Secondary | ICD-10-CM

## 2014-04-03 DIAGNOSIS — D508 Other iron deficiency anemias: Secondary | ICD-10-CM

## 2014-04-03 DIAGNOSIS — R7989 Other specified abnormal findings of blood chemistry: Secondary | ICD-10-CM

## 2014-04-03 DIAGNOSIS — R700 Elevated erythrocyte sedimentation rate: Secondary | ICD-10-CM

## 2014-04-03 NOTE — Patient Instructions (Signed)
Will re review  Records .   Plan ct as advised by   Hematology.  [place] .Marland Kitchen   Agree with  Rheumatology  Consult   Based on the level of increase inflammation  and  Unknown cause of the anemia .  Then plan follow up.

## 2014-04-03 NOTE — Progress Notes (Signed)
Pre visit review using our clinic review tool, if applicable. No additional management support is needed unless otherwise documented below in the visit note.   Chief Complaint  Patient presents with  . Follow-up    fu from specialist s  . Anemia    HPI: Kendra Mcfarland 63 y.o. comes in today with reports from evaluation by hematology specialist to [facility] for further opinion in regard to her persistent anemia.  He wanted to discuss things to help her decide how to proceed forward.  She had a full evaluation by Dr. Shirline Frees including a bone marrow biopsy. Which was hypercellular however bone marrow was going to be reviewed by another pathologist. She's had a history of IV iron infusions with her low iron but it did not increase her IBC her evaluation of GI blood loss has been unrevealing most recently with negative celiac. Molecular genetic testing among other lab values were done at [facility] including inflammatory markers. Of note ESR was 134 CRP 8.9 ferritin 782. Her IBC was however low at 10%.her haptoglobin was elevated for 50 but LDH a reticular count were normal or low Hematologist and advised if not done to do a pain in scan and CT abdomen chest pelvis to look for occult infection or inflammatory process or malignancy. He had also suggested a rheumatologic evaluation because of these markers.  Clinically she has no fevers continues to feel tired however especially with walking but no acute changes. Doesn't know if things are worse or not. ROS: See pertinent positives and negatives per HPI.  Past Medical History  Diagnosis Date  . Anemia     years ago  . Transfusion history     before her hysterectomy  . Anxiety   . Hyperlipidemia   . Hypertension   . Spinal stenosis   . Spondylosis     degenerative adult  . Bulging lumbar disc   . MONONUCLEOSIS 08/14/2007    Qualifier: Diagnosis of  Problem Stop Reason: Removed By: Lawernce Ion, CMA (AAMA), Bethann Berkshire   .  Vertigo   . Iron disorder     excessive iron storage     Family History  Problem Relation Age of Onset  . Hypertension Mother   . Arthritis Mother   . Hyperlipidemia Mother   . Memory loss Mother 12    short term  . Other Mother 86    angioplasty  . Arthritis Father   . Hypertension Father   . Heart attack Father   . Hyperthyroidism Sister   . Anemia Sister   . Hypothyroidism Sister   . Hyperlipidemia Sister   . Colon cancer Neg Hx   . Esophageal cancer Neg Hx   . Rectal cancer Neg Hx   . Stomach cancer Neg Hx     History   Social History  . Marital Status: Married    Spouse Name: N/A    Number of Children: N/A  . Years of Education: N/A   Social History Main Topics  . Smoking status: Former Smoker    Quit date: 05/17/1994  . Smokeless tobacco: Never Used  . Alcohol Use: Yes     Comment: rare  . Drug Use: No  . Sexual Activity: Not on file   Other Topics Concern  . Not on file   Social History Narrative   No ets   Regular exercise- thinking about yoga  As per dr Danielle Dess.    HH of 2    Pet dog  puppy 6 months    Good sleep   G2P2    Outpatient Encounter Prescriptions as of 04/03/2014  Medication Sig  . acetaminophen (TYLENOL) 325 MG tablet Take 650 mg by mouth every 6 (six) hours as needed for mild pain.   . cholecalciferol (VITAMIN D) 1000 UNITS tablet Take 1,000 Units by mouth daily.  . Cyanocobalamin (VITAMIN B-12) 5000 MCG SUBL Place 1 tablet under the tongue daily.  Marland Kitchen esomeprazole (NEXIUM) 40 MG capsule Take 1 capsule (40 mg total) by mouth daily before breakfast.  . lisinopril-hydrochlorothiazide (PRINZIDE,ZESTORETIC) 20-12.5 MG per tablet Take 1 tablet by mouth at bedtime.  . Multiple Vitamin (MULTIVITAMIN WITH MINERALS) TABS tablet Take 1 tablet by mouth daily.  . rosuvastatin (CRESTOR) 10 MG tablet Take 1 tablet (10 mg total) by mouth daily.    EXAM:  BP 148/70 mmHg  Temp(Src) 98.8 F (37.1 C) (Oral)  Wt 148 lb 9.6 oz (67.405 kg)  Body  mass index is 30 kg/(m^2).  GENERAL: vitals reviewed and listed above, alert, oriented, appears well hydrated and in no acute distress PSYCH: pleasant and cooperative, no obvious depression or anxiety Lab Results  Component Value Date   WBC 10.4* 03/18/2014   HGB 9.8* 03/18/2014   HCT 31.8* 03/18/2014   PLT 672* 03/18/2014   GLUCOSE 130 11/14/2013   CHOL 131 07/06/2013   TRIG 97.0 07/06/2013   HDL 37.40* 07/06/2013   LDLDIRECT 214.5 03/28/2008   LDLCALC 74 07/06/2013   ALT 18 11/14/2013   AST 17 11/14/2013   NA 143 11/14/2013   K 2.9* 11/14/2013   CL 99 07/06/2013   CREATININE 0.9 11/14/2013   BUN 10.8 11/14/2013   CO2 37* 11/14/2013   TSH 1.516 09/21/2013   INR 1.08 02/28/2014    ASSESSMENT AND PLAN:  Discussed the following assessment and plan:  Anemia, unspecified anemia type - moderate extensive eval iron def b12 low not respoind to iv iron elevated infl markers  - Plan: CT Chest W Contrast, CT Abdomen Pelvis W Contrast  Other iron deficiency anemias - Plan: CT Chest W Contrast, CT Abdomen Pelvis W Contrast  ESR raised - 134  crp 8.9 at [facility] ferritin 782 - Plan: CT Chest W Contrast, CT Abdomen Pelvis W Contrast  Other fatigue - Plan: CT Chest W Contrast, CT Abdomen Pelvis W Contrast  Elevated ferritin - Plan: CT Chest W Contrast, CT Abdomen Pelvis W Contrast fortunately look s well   Uncertain cause of mod anemai and inflammatory markers ...  further wu indicated ..  -Patient advised to return or notify health care team  if symptoms worsen t or new concerns arise.  Patient Instructions  Will re review  Records .   Plan ct as advised by   Hematology.  [place] .Marland Kitchen   Agree with  Rheumatology  Consult   Based on the level of increase inflammation  and  Unknown cause of the anemia .  Then plan follow up.      Standley Brooking. Panosh M.D.  Total visit 50mins > 50% spent counseling and coordinating care  Review records and planning

## 2014-04-08 ENCOUNTER — Other Ambulatory Visit: Payer: Self-pay | Admitting: Family Medicine

## 2014-04-08 ENCOUNTER — Telehealth: Payer: Self-pay | Admitting: Family Medicine

## 2014-04-08 ENCOUNTER — Encounter: Payer: Self-pay | Admitting: Internal Medicine

## 2014-04-08 NOTE — Telephone Encounter (Signed)
Pt seen rheumatology at [facility].  Doctor agrees with doing a CT scan. Would you like to order?

## 2014-04-08 NOTE — Telephone Encounter (Signed)
I will place the order   For chest abd pelvic ct    However i dont see a recent BMP    May need to order this unless seen in ehr  Please order bmp if needed. thanks

## 2014-04-09 ENCOUNTER — Other Ambulatory Visit: Payer: BC Managed Care – PPO

## 2014-04-09 ENCOUNTER — Inpatient Hospital Stay: Admission: RE | Admit: 2014-04-09 | Payer: BC Managed Care – PPO | Source: Ambulatory Visit

## 2014-04-09 NOTE — Telephone Encounter (Signed)
No order placed in the system.

## 2014-04-09 NOTE — Telephone Encounter (Signed)
Please order BMP  i laready placed the ct orders in the system

## 2014-04-10 ENCOUNTER — Other Ambulatory Visit: Payer: Self-pay | Admitting: Internal Medicine

## 2014-04-10 ENCOUNTER — Other Ambulatory Visit: Payer: Self-pay | Admitting: Family Medicine

## 2014-04-10 ENCOUNTER — Other Ambulatory Visit: Payer: BC Managed Care – PPO

## 2014-04-10 ENCOUNTER — Ambulatory Visit (INDEPENDENT_AMBULATORY_CARE_PROVIDER_SITE_OTHER)
Admission: RE | Admit: 2014-04-10 | Discharge: 2014-04-10 | Disposition: A | Discharge status: Home / Self Care | Payer: BC Managed Care – PPO | Source: Ambulatory Visit | Attending: Internal Medicine | Admitting: Internal Medicine

## 2014-04-10 DIAGNOSIS — N2889 Other specified disorders of kidney and ureter: Secondary | ICD-10-CM

## 2014-04-10 DIAGNOSIS — D508 Other iron deficiency anemias: Secondary | ICD-10-CM

## 2014-04-10 DIAGNOSIS — R700 Elevated erythrocyte sedimentation rate: Secondary | ICD-10-CM

## 2014-04-10 DIAGNOSIS — C642 Malignant neoplasm of left kidney, except renal pelvis: Secondary | ICD-10-CM

## 2014-04-10 DIAGNOSIS — R5383 Other fatigue: Secondary | ICD-10-CM

## 2014-04-10 DIAGNOSIS — D649 Anemia, unspecified: Secondary | ICD-10-CM

## 2014-04-10 DIAGNOSIS — Z01812 Encounter for preprocedural laboratory examination: Secondary | ICD-10-CM

## 2014-04-10 DIAGNOSIS — C649 Malignant neoplasm of unspecified kidney, except renal pelvis: Secondary | ICD-10-CM | POA: Insufficient documentation

## 2014-04-10 DIAGNOSIS — R7989 Other specified abnormal findings of blood chemistry: Secondary | ICD-10-CM

## 2014-04-10 MED ORDER — IOHEXOL 300 MG/ML  SOLN
100.0000 mL | Freq: Once | INTRAMUSCULAR | Status: AC | PRN
  Administered 2014-04-10: 100 mL via INTRAVENOUS

## 2014-04-10 NOTE — Telephone Encounter (Signed)
Placed order for BMP 

## 2014-04-12 ENCOUNTER — Telehealth: Payer: Self-pay | Admitting: Internal Medicine

## 2014-04-12 NOTE — Telephone Encounter (Signed)
s.w. pt husband and advised on DEC appt....ok and aware °

## 2014-04-15 ENCOUNTER — Telehealth: Payer: Self-pay | Admitting: Internal Medicine

## 2014-04-15 NOTE — Telephone Encounter (Signed)
Dr Louis Meckel can see patient as early as Tuesday 12/08 at 3:45PM. They will work her in other wise if that is too early. They have that spot held for her so please let them know either way.

## 2014-04-15 NOTE — Telephone Encounter (Signed)
This  Ok to do that appt time.   Please contact patient and see if that is ok with her.

## 2014-04-16 NOTE — Telephone Encounter (Signed)
Called and spoke to the pt.  She has decided that she does not want to be seen at urology.  She has elected to become a patient at [facility].  She is currently working on getting an appointment.  I stressed the urgency of this appointment and that she needs to be seen asap.  She understands but will still continue seeking at appt with [facility].  Called and spoke to [place] to cancel the appointment.  Caryl Pina stated she will follow up with the patient in a few weeks to make sure she has received an appointment.  Advised the pt and Caryl Pina to call if any assistance is needed.

## 2014-04-17 ENCOUNTER — Telehealth: Payer: Self-pay | Admitting: Internal Medicine

## 2014-04-17 NOTE — Telephone Encounter (Signed)
pt called to cx all appts....done...she did not want to r/s

## 2014-04-29 ENCOUNTER — Other Ambulatory Visit: Payer: BC Managed Care – PPO

## 2014-04-29 ENCOUNTER — Ambulatory Visit: Payer: BC Managed Care – PPO | Admitting: Internal Medicine

## 2014-04-30 ENCOUNTER — Encounter: Payer: Self-pay | Admitting: Internal Medicine

## 2014-06-09 ENCOUNTER — Emergency Department (HOSPITAL_COMMUNITY): Payer: BLUE CROSS/BLUE SHIELD

## 2014-06-09 ENCOUNTER — Inpatient Hospital Stay (HOSPITAL_COMMUNITY)
Admission: EM | Admit: 2014-06-09 | Discharge: 2014-06-14 | DRG: 054 | Disposition: A | Discharge status: Home Health | Payer: BLUE CROSS/BLUE SHIELD | Attending: Internal Medicine | Admitting: Internal Medicine

## 2014-06-09 ENCOUNTER — Encounter (HOSPITAL_COMMUNITY): Payer: Self-pay | Admitting: *Deleted

## 2014-06-09 DIAGNOSIS — Z885 Allergy status to narcotic agent status: Secondary | ICD-10-CM | POA: Diagnosis not present

## 2014-06-09 DIAGNOSIS — N179 Acute kidney failure, unspecified: Secondary | ICD-10-CM | POA: Diagnosis present

## 2014-06-09 DIAGNOSIS — Z8249 Family history of ischemic heart disease and other diseases of the circulatory system: Secondary | ICD-10-CM | POA: Diagnosis not present

## 2014-06-09 DIAGNOSIS — G9340 Encephalopathy, unspecified: Secondary | ICD-10-CM | POA: Diagnosis present

## 2014-06-09 DIAGNOSIS — D696 Thrombocytopenia, unspecified: Secondary | ICD-10-CM | POA: Diagnosis present

## 2014-06-09 DIAGNOSIS — D473 Essential (hemorrhagic) thrombocythemia: Secondary | ICD-10-CM | POA: Diagnosis present

## 2014-06-09 DIAGNOSIS — R930 Abnormal findings on diagnostic imaging of skull and head, not elsewhere classified: Secondary | ICD-10-CM

## 2014-06-09 DIAGNOSIS — R7989 Other specified abnormal findings of blood chemistry: Secondary | ICD-10-CM | POA: Diagnosis present

## 2014-06-09 DIAGNOSIS — Z888 Allergy status to other drugs, medicaments and biological substances status: Secondary | ICD-10-CM

## 2014-06-09 DIAGNOSIS — M479 Spondylosis, unspecified: Secondary | ICD-10-CM | POA: Diagnosis present

## 2014-06-09 DIAGNOSIS — E785 Hyperlipidemia, unspecified: Secondary | ICD-10-CM | POA: Diagnosis present

## 2014-06-09 DIAGNOSIS — Z79899 Other long term (current) drug therapy: Secondary | ICD-10-CM

## 2014-06-09 DIAGNOSIS — I10 Essential (primary) hypertension: Secondary | ICD-10-CM | POA: Diagnosis present

## 2014-06-09 DIAGNOSIS — R9089 Other abnormal findings on diagnostic imaging of central nervous system: Secondary | ICD-10-CM | POA: Diagnosis present

## 2014-06-09 DIAGNOSIS — C649 Malignant neoplasm of unspecified kidney, except renal pelvis: Secondary | ICD-10-CM | POA: Diagnosis present

## 2014-06-09 DIAGNOSIS — D75839 Thrombocytosis, unspecified: Secondary | ICD-10-CM | POA: Diagnosis present

## 2014-06-09 DIAGNOSIS — C642 Malignant neoplasm of left kidney, except renal pelvis: Secondary | ICD-10-CM | POA: Diagnosis present

## 2014-06-09 DIAGNOSIS — Z882 Allergy status to sulfonamides status: Secondary | ICD-10-CM | POA: Diagnosis not present

## 2014-06-09 DIAGNOSIS — D509 Iron deficiency anemia, unspecified: Secondary | ICD-10-CM | POA: Diagnosis present

## 2014-06-09 DIAGNOSIS — Z905 Acquired absence of kidney: Secondary | ICD-10-CM | POA: Diagnosis present

## 2014-06-09 DIAGNOSIS — Z880 Allergy status to penicillin: Secondary | ICD-10-CM | POA: Diagnosis not present

## 2014-06-09 DIAGNOSIS — C7931 Secondary malignant neoplasm of brain: Principal | ICD-10-CM

## 2014-06-09 DIAGNOSIS — M4807 Spinal stenosis, lumbosacral region: Secondary | ICD-10-CM | POA: Diagnosis present

## 2014-06-09 DIAGNOSIS — Z9081 Acquired absence of spleen: Secondary | ICD-10-CM | POA: Diagnosis present

## 2014-06-09 DIAGNOSIS — F419 Anxiety disorder, unspecified: Secondary | ICD-10-CM | POA: Diagnosis present

## 2014-06-09 DIAGNOSIS — E538 Deficiency of other specified B group vitamins: Secondary | ICD-10-CM | POA: Diagnosis present

## 2014-06-09 DIAGNOSIS — I6783 Posterior reversible encephalopathy syndrome: Secondary | ICD-10-CM | POA: Diagnosis present

## 2014-06-09 DIAGNOSIS — D630 Anemia in neoplastic disease: Secondary | ICD-10-CM | POA: Diagnosis present

## 2014-06-09 DIAGNOSIS — Z87891 Personal history of nicotine dependence: Secondary | ICD-10-CM

## 2014-06-09 DIAGNOSIS — M5126 Other intervertebral disc displacement, lumbar region: Secondary | ICD-10-CM | POA: Diagnosis present

## 2014-06-09 DIAGNOSIS — R414 Neurologic neglect syndrome: Secondary | ICD-10-CM | POA: Diagnosis present

## 2014-06-09 HISTORY — DX: Malignant (primary) neoplasm, unspecified: C80.1

## 2014-06-09 LAB — URINALYSIS, ROUTINE W REFLEX MICROSCOPIC
BILIRUBIN URINE: NEGATIVE
GLUCOSE, UA: NEGATIVE mg/dL
Ketones, ur: 15 mg/dL — AB
Nitrite: NEGATIVE
Protein, ur: NEGATIVE mg/dL
SPECIFIC GRAVITY, URINE: 1.01 (ref 1.005–1.030)
Urobilinogen, UA: 0.2 mg/dL (ref 0.0–1.0)
pH: 7.5 (ref 5.0–8.0)

## 2014-06-09 LAB — CBC
HCT: 36.8 % (ref 36.0–46.0)
HEMOGLOBIN: 12 g/dL (ref 12.0–15.0)
MCH: 26.7 pg (ref 26.0–34.0)
MCHC: 32.6 g/dL (ref 30.0–36.0)
MCV: 82 fL (ref 78.0–100.0)
Platelets: 1262 10*3/uL (ref 150–400)
RBC: 4.49 MIL/uL (ref 3.87–5.11)
RDW: 17.5 % — ABNORMAL HIGH (ref 11.5–15.5)
WBC: 17.5 10*3/uL — ABNORMAL HIGH (ref 4.0–10.5)

## 2014-06-09 LAB — COMPREHENSIVE METABOLIC PANEL
ALT: 14 U/L (ref 0–35)
AST: 22 U/L (ref 0–37)
Albumin: 2.9 g/dL — ABNORMAL LOW (ref 3.5–5.2)
Alkaline Phosphatase: 151 U/L — ABNORMAL HIGH (ref 39–117)
Anion gap: 7 (ref 5–15)
BUN: 11 mg/dL (ref 6–23)
CO2: 33 mmol/L — AB (ref 19–32)
Calcium: 8.7 mg/dL (ref 8.4–10.5)
Chloride: 100 mmol/L (ref 96–112)
Creatinine, Ser: 1.32 mg/dL — ABNORMAL HIGH (ref 0.50–1.10)
GFR calc Af Amer: 48 mL/min — ABNORMAL LOW (ref >90)
GFR calc non Af Amer: 42 mL/min — ABNORMAL LOW (ref >90)
Glucose, Bld: 96 mg/dL (ref 70–99)
Potassium: 3.5 mmol/L (ref 3.5–5.1)
SODIUM: 140 mmol/L (ref 135–145)
TOTAL PROTEIN: 6.6 g/dL (ref 6.0–8.3)
Total Bilirubin: 0.7 mg/dL (ref 0.3–1.2)

## 2014-06-09 LAB — URINE MICROSCOPIC-ADD ON

## 2014-06-09 MED ORDER — ONDANSETRON HCL 4 MG/2ML IJ SOLN: 4.0000 mg | Freq: Once | INTRAMUSCULAR | Status: DC

## 2014-06-09 MED ORDER — ONDANSETRON 4 MG PO TBDP
4.0000 mg | ORAL_TABLET | ORAL | Status: DC | PRN
  Filled 2014-06-09 (×2): qty 1

## 2014-06-09 MED ORDER — ACETAMINOPHEN 650 MG RE SUPP: 650.0000 mg | Freq: Four times a day (QID) | RECTAL | Status: DC | PRN

## 2014-06-09 MED ORDER — AMLODIPINE BESYLATE 5 MG PO TABS
5.0000 mg | ORAL_TABLET | Freq: Every day | ORAL | Status: DC
  Administered 2014-06-10 – 2014-06-12 (×3): 5 mg via ORAL
  Filled 2014-06-09 (×4): qty 1

## 2014-06-09 MED ORDER — PROMETHAZINE HCL 25 MG/ML IJ SOLN: 25.0000 mg | Freq: Once | INTRAMUSCULAR | Status: DC

## 2014-06-09 MED ORDER — DEXAMETHASONE SODIUM PHOSPHATE 4 MG/ML IJ SOLN
4.0000 mg | Freq: Four times a day (QID) | INTRAMUSCULAR | Status: DC
  Administered 2014-06-09 – 2014-06-10 (×2): 4 mg via INTRAVENOUS
  Filled 2014-06-09 (×7): qty 1

## 2014-06-09 MED ORDER — SODIUM CHLORIDE 0.9 % IV SOLN
INTRAVENOUS | Status: DC
  Administered 2014-06-09: 21:00:00 via INTRAVENOUS

## 2014-06-09 MED ORDER — SODIUM CHLORIDE 0.9 % IJ SOLN
3.0000 mL | Freq: Two times a day (BID) | INTRAMUSCULAR | Status: DC
  Administered 2014-06-10 – 2014-06-12 (×3): 3 mL via INTRAVENOUS

## 2014-06-09 MED ORDER — DEXAMETHASONE SODIUM PHOSPHATE 10 MG/ML IJ SOLN
10.0000 mg | Freq: Once | INTRAMUSCULAR | Status: AC
  Administered 2014-06-09: 10 mg via INTRAVENOUS
  Filled 2014-06-09: qty 1

## 2014-06-09 MED ORDER — ONDANSETRON HCL 4 MG/2ML IJ SOLN
4.0000 mg | Freq: Four times a day (QID) | INTRAMUSCULAR | Status: DC | PRN
  Administered 2014-06-09: 4 mg via INTRAVENOUS
  Filled 2014-06-09: qty 2

## 2014-06-09 MED ORDER — OXYCODONE HCL 5 MG PO TABS: 5.0000 mg | ORAL_TABLET | ORAL | Status: DC | PRN

## 2014-06-09 MED ORDER — DEXAMETHASONE SODIUM PHOSPHATE 4 MG/ML IJ SOLN
6.0000 mg | Freq: Once | INTRAMUSCULAR | Status: DC
  Filled 2014-06-09: qty 2

## 2014-06-09 MED ORDER — HYDROMORPHONE HCL 1 MG/ML IJ SOLN
0.5000 mg | Freq: Once | INTRAMUSCULAR | Status: AC
Start: 2014-06-09 — End: 2014-06-09
  Administered 2014-06-09: 0.5 mg via INTRAVENOUS
  Filled 2014-06-09 (×2): qty 1

## 2014-06-09 MED ORDER — ONDANSETRON HCL 4 MG PO TABS: 4.0000 mg | ORAL_TABLET | Freq: Four times a day (QID) | ORAL | Status: DC | PRN

## 2014-06-09 MED ORDER — ONDANSETRON HCL 4 MG/2ML IJ SOLN
4.0000 mg | Freq: Once | INTRAMUSCULAR | Status: AC
  Administered 2014-06-09: 4 mg via INTRAVENOUS
  Filled 2014-06-09: qty 2

## 2014-06-09 MED ORDER — ACETAMINOPHEN 325 MG PO TABS
650.0000 mg | ORAL_TABLET | Freq: Four times a day (QID) | ORAL | Status: DC | PRN
  Administered 2014-06-13: 650 mg via ORAL
  Filled 2014-06-09 (×2): qty 2

## 2014-06-09 MED ORDER — HYDROMORPHONE HCL 1 MG/ML IJ SOLN
0.5000 mg | INTRAMUSCULAR | Status: DC | PRN
Start: 2014-06-09 — End: 2014-06-14
  Administered 2014-06-09: 0.5 mg via INTRAVENOUS
  Filled 2014-06-09: qty 1

## 2014-06-09 MED ORDER — ALUM & MAG HYDROXIDE-SIMETH 200-200-20 MG/5ML PO SUSP: 30.0000 mL | Freq: Four times a day (QID) | ORAL | Status: DC | PRN

## 2014-06-09 NOTE — ED Notes (Addendum)
Admitting MD at bedside discussing POC with pt father. While in room pt o2 levels began to drop down to 86%, good wave form. Per PA place 2L O2 on pt.

## 2014-06-09 NOTE — ED Notes (Signed)
PA at bedside.

## 2014-06-09 NOTE — ED Provider Notes (Signed)
CSN: [id number]     Arrival date & time 06/09/14  1241 History   First MD Initiated Contact with Patient 06/09/14 1348     Chief Complaint  Patient presents with  . Altered Mental Status     (Consider location/radiation/quality/duration/timing/severity/associated sxs/prior Treatment) HPI Kendra Mcfarland is a 64 y.o. female with a history of anemia, recent nephrectomy and splenectomy on January 15 due to renal cancer comes in for evaluation of altered mental status. Patient is accompanied by her husband who contributes to history of present illness. Patient reports yesterday at approximately noon she began to feel disoriented and has gotten progressively worse with her perception. She reports she will go to the bathroom and sit down in a different part of the room thinking that she is on the toilet. She admits to being very confused with directions and does not know which way to turn in her own house. She feels her symptoms could be due to her doxycycline that she started taking on Wednesday. She feels that she constantly has to turn her head and look to the left, but is able to correct her head position and vision upon instruction. Denies fevers, chills, headaches, dizziness, syncope, chest pain, short of breath, cough, abdominal pain, nausea or vomiting, photophobia, phonophobia, diarrhea or constipation, dysuria, hematuria.  Past Medical History  Diagnosis Date  . Anemia     years ago  . Transfusion history     before her hysterectomy  . Anxiety   . Hyperlipidemia   . Hypertension   . Spinal stenosis   . Spondylosis     degenerative adult  . Bulging lumbar disc   . MONONUCLEOSIS 08/14/2007    Qualifier: Diagnosis of  Problem Stop Reason: Removed By: Hulan Saas, CMA (AAMA), Quita Skye   . Vertigo   . Iron disorder     excessive iron storage   . Cancer    Past Surgical History  Procedure Laterality Date  . Inguinal hernia repair      at birth  . Cesarean section      x 2  .  Appendectomy    . Abdominal hysterectomy      bleeding fibroid tumors has ovaries  . Vein protrusion in head as a child    . Blood transfusion from vaginal bleeding     Family History  Problem Relation Age of Onset  . Hypertension Mother   . Arthritis Mother   . Hyperlipidemia Mother   . Memory loss Mother 54    short term  . Other Mother 76    angioplasty  . Arthritis Father   . Hypertension Father   . Heart attack Father   . Hyperthyroidism Sister   . Anemia Sister   . Hypothyroidism Sister   . Hyperlipidemia Sister   . Colon cancer Neg Hx   . Esophageal cancer Neg Hx   . Rectal cancer Neg Hx   . Stomach cancer Neg Hx    History  Substance Use Topics  . Smoking status: Former Smoker    Quit date: 05/17/1994  . Smokeless tobacco: Never Used  . Alcohol Use: Yes     Comment: rare   OB History    No data available     Review of Systems  All other systems reviewed and are negative. A 10 point review of systems was completed and was negative except for pertinent positives and negatives as mentioned in the history of present illness      Allergies  Tramadol;  Hydrocodone; Naproxen sodium; Penicillins; and Sulfonamide derivatives  Home Medications   Prior to Admission medications   Medication Sig Start Date End Date Taking? Authorizing Provider  acetaminophen (TYLENOL) 325 MG tablet Take 650 mg by mouth every 6 (six) hours as needed for mild pain.     Historical Provider, MD  cholecalciferol (VITAMIN D) 1000 UNITS tablet Take 1,000 Units by mouth daily.    Historical Provider, MD  Cyanocobalamin (VITAMIN B-12) 5000 MCG SUBL Place 1 tablet under the tongue daily.    Historical Provider, MD  esomeprazole (NEXIUM) 40 MG capsule Take 1 capsule (40 mg total) by mouth daily before breakfast. 11/08/12   Sable Feil, MD  lisinopril-hydrochlorothiazide (PRINZIDE,ZESTORETIC) 20-12.5 MG per tablet Take 1 tablet by mouth at bedtime.    Historical Provider, MD  Multiple  Vitamin (MULTIVITAMIN WITH MINERALS) TABS tablet Take 1 tablet by mouth daily.    Historical Provider, MD  rosuvastatin (CRESTOR) 10 MG tablet Take 1 tablet (10 mg total) by mouth daily. 07/13/13   Burnis Medin, MD   BP 145/62 mmHg  Pulse 91  Temp(Src) 98.7 F (37.1 C) (Rectal)  Resp 30  SpO2 96% Physical Exam  Constitutional: She is oriented to person, place, and time. She appears well-developed and well-nourished.  HENT:  Head: Normocephalic and atraumatic.  Mouth/Throat: Oropharynx is clear and moist.  Eyes: Conjunctivae are normal. Pupils are equal, round, and reactive to light. Right eye exhibits no discharge. Left eye exhibits no discharge. No scleral icterus.  Extraocular movements intact, but pt has left gaze preference.  Neck: Neck supple.  Cardiovascular: Normal rate, regular rhythm and normal heart sounds.   Pulmonary/Chest: Effort normal and breath sounds normal. No respiratory distress. She has no wheezes. She has no rales.  Abdominal: Soft. There is no tenderness.  Musculoskeletal: She exhibits no tenderness.  Neurological: She is alert and oriented to person, place, and time.  Patient favors downward left gaze as well as head position. Can easily correct upon instruction. Cranial Nerves II-XII grossly intact. Motor and sensation 5/5 in all 4 extremities. Unable to perform finger to nose hand movements.  Skin: Skin is warm and dry. No rash noted.  Psychiatric: She has a normal mood and affect.  Nursing note and vitals reviewed.   ED Course  Procedures (including critical care time) Labs Review Labs Reviewed  CBC - Abnormal; Notable for the following:    WBC 17.5 (*)    RDW 17.5 (*)    Platelets 1262 (*)    All other components within normal limits  COMPREHENSIVE METABOLIC PANEL - Abnormal; Notable for the following:    CO2 33 (*)    Creatinine, Ser 1.32 (*)    Albumin 2.9 (*)    Alkaline Phosphatase 151 (*)    GFR calc non Af Amer 42 (*)    GFR calc Af Amer  48 (*)    All other components within normal limits  URINALYSIS, ROUTINE W REFLEX MICROSCOPIC  PATHOLOGIST SMEAR REVIEW    Imaging Review  Ct Head Wo Contrast  06/09/2014   CLINICAL DATA:  Nausea and vomiting. Slurred speech. Confusion. Urinary tract infection. Nystagmus. Prior left nephrectomy and splenectomy 9 days ago at [facility] for renal cell carcinoma.  EXAM: CT HEAD WITHOUT CONTRAST  TECHNIQUE: Contiguous axial images were obtained from the base of the skull through the vertex without intravenous contrast.  COMPARISON:  None  FINDINGS: The cerebellum and brainstem appear unremarkable as do the thalami, basal ganglia, and ventricular system.  There is abnormal vasogenic edema posteriorly in the periventricular white matter and corona radiata. Possible 1.3 cm mass in the right parietal lobe, image 19 series 2. No overt intracranial hyperdensity to favor intracranial hemorrhage. No destructive calvarial lesions identified.  IMPRESSION: 1. Considerably abnormal intracranial appearance with scattered abnormal white matter hypodensities in the periventricular white matter, corona radiata, and occipital and parietal lobes, with a possible mass lesion in the right parietal lobe. 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 Bilateral encephalitis or white matter infarcts not excluded. MRI brain with without contrast recommended (reduced dose of contrast may be warranted due to renal insufficiency).   Electronically Signed   By: Sherryl Barters M.D.   On: 06/09/2014 16:54     EKG Interpretation   Date/Time:  Sunday June 09 2014 13:02:19 EST Ventricular Rate:  86 PR Interval:  124 QRS Duration: 82 QT Interval:  394 QTC Calculation: 471 R Axis:   40 Text Interpretation:  Normal sinus rhythm Possible Anterior infarct , age  undetermined Abnormal ECG No old tracing to compare Confirmed by GOLDSTON   MD, SCOTT ([id number]) on 06/09/2014 2:50:05 PM      Meds given in ED:  Medications  ondansetron  (ZOFRAN-ODT) disintegrating tablet 4 mg (not administered)  ondansetron (ZOFRAN) injection 4 mg (4 mg Intravenous Given 06/09/14 1633)    New Prescriptions   No medications on file   Filed Vitals:   06/09/14 1438 06/09/14 1500 06/09/14 1530 06/09/14 1600  BP:  163/78 154/71 145/62  Pulse:  94 92 91  Temp: 98.7 F (37.1 C)     TempSrc: Rectal     Resp:  23 29 30   SpO2:  96% 95% 96%     MDM  Pt with recent nephrectomy/splenectomy on 1/15 at [facility] secondary to [place]. Began to experience difficulty with coordination and direction on Sunday at noon. No fever, HA, syncope or N/V. Pt with Left gaze preference. Difficulty with finger to nose hand movements.  Motor and Sensation 5/5 Pending labs. Plan is for CT head evaluation and ultimate Neurology Consult. Care signed out to Harvie Heck, PA-C for further evaluation and management of care. Pt presentation and ED course discussed with attending, Dr. Regenia Skeeter, who also saw and evaluated the pt. Final diagnoses:  None        Verl Dicker, PA-C 06/10/14 0174  Ephraim Hamburger, MD 06/12/14 [phone]

## 2014-06-09 NOTE — ED Notes (Signed)
CRITICAL VALUE ALERT  Critical value received:  PLATELET 1262  Date of notification:  06/09/2013  Time of notification:  9509  Critical value read back: YES  Nurse who received alert:  Renne Crigler RN  MD notified (1st page): Regenia Skeeter MD

## 2014-06-09 NOTE — Consult Note (Addendum)
NEURO HOSPITALIST CONSULT NOTE    Reason for Consult: confusion , HA, abnormal CT brain  HPI:                                                                                                                                          Kendra Mcfarland is an 64 y.o. female with a past medical history significant for HTN, hyperlipidemia, anxiety, of renal cell carcinoma status post nephrectomy and splenectomy performed at [facility] on 05/31/2014, brought in by family for further evaluation of the above stated symptoms. Husband is at the bedside and informs me that she was doing well until yesterday around 1 pm when he started complaining of visual difficulty, mainly blurred vision, as well as HA. This became progressively worse over the course of the afternoon, and by the evening she was acting confused and disoriented in the house, forgetting how to get around in her own home. She was recently treated with doxycycline for urinary tract infection. . Denies recent fever, vaccinations, or foreign travel. No vertigo ,double vision, focal weakness or numbness,slurred speech, or language impairment. CT brain reviewed by myself demonstrated evidence of vasogenic edema posteriorly in the periventricular white matter and corona radiata and a possible 1.3 cm mass in the right parietal lobe. No hemorrhagic lesions. Serologies significant for leukocytosis >17,000 and thrombocytosis 1,262,000,  Cr 1,32. Received 10 mg IV decadron in the ED. Marland Kitchen Past Medical History  Diagnosis Date  . Anemia     years ago  . Transfusion history     before her hysterectomy  . Anxiety   . Hyperlipidemia   . Hypertension   . Spinal stenosis   . Spondylosis     degenerative adult  . Bulging lumbar disc   . MONONUCLEOSIS 08/14/2007    Qualifier: Diagnosis of  Problem Stop Reason: Removed By: Hulan Saas, CMA (AAMA), Quita Skye   . Vertigo   . Iron disorder     excessive iron storage   . Cancer     Past  Surgical History  Procedure Laterality Date  . Inguinal hernia repair      at birth  . Cesarean section      x 2  . Appendectomy    . Abdominal hysterectomy      bleeding fibroid tumors has ovaries  . Vein protrusion in head as a child    . Blood transfusion from vaginal bleeding      Family History  Problem Relation Age of Onset  . Hypertension Mother   . Arthritis Mother   . Hyperlipidemia Mother   . Memory loss Mother 27    short term  . Other Mother 42    angioplasty  . Arthritis Father   . Hypertension Father   . Heart  attack Father   . Hyperthyroidism Sister   . Anemia Sister   . Hypothyroidism Sister   . Hyperlipidemia Sister   . Colon cancer Neg Hx   . Esophageal cancer Neg Hx   . Rectal cancer Neg Hx   . Stomach cancer Neg Hx     Family History: no epilepsy, brain tumor, or brain aneurysms.   Social History:  reports that she quit smoking about 20 years ago. She has never used smokeless tobacco. She reports that she drinks alcohol. She reports that she does not use illicit drugs.  Allergies  Allergen Reactions  . Tramadol Nausea And Vomiting  . Hydrocodone Nausea And Vomiting  . Naproxen Sodium     REACTION: rash/blisters  Can take advil   . Penicillins Hives  . Sulfonamide Derivatives     REACTION: rash/blisters/ dypsnea    MEDICATIONS:                                                                                                                     I have reviewed the patient's current medications.   ROS:                                                                                                                                       History obtained from the patient, husband and chart review  General ROS: negative for - chills, fever, night sweats, weight gain or weight loss Psychological ROS: negative for - behavioral disorder, hallucinations, memory difficulties, mood swings or suicidal ideation Ophthalmic ROS: negative for - double vision  or eye pain ENT ROS: negative for - epistaxis, nasal discharge, oral lesions, sore throat, tinnitus or vertigo Allergy and Immunology ROS: negative for - hives or itchy/watery eyes Hematological and Lymphatic ROS: negative for - bleeding problems, bruising or swollen lymph nodes Endocrine ROS: negative for - galactorrhea, hair pattern changes, polydipsia/polyuria or temperature intolerance Respiratory ROS: negative for - cough, hemoptysis, shortness of breath or wheezing Cardiovascular ROS: negative for - chest pain, dyspnea on exertion, edema or irregular heartbeat Gastrointestinal ROS: negative for - abdominal pain, diarrhea, hematemesis, nausea/vomiting or stool incontinence Genito-Urinary ROS: negative for - hematuria, incontinence or urinary frequency/urgency Musculoskeletal ROS: negative for - joint swelling Neurological ROS: as noted in HPI Dermatological ROS: negative for rash and skin lesion changes  Physical exam: pleasant female in no apparent distress. Blood pressure 140/68, pulse 94, temperature 98.7 F (  37.1 C), temperature source Rectal, resp. rate 22, SpO2 97 %. Head: normocephalic. Neck: supple, no bruits, no JVD. Cardiac: no murmurs. Lungs: clear. Abdomen: soft, no tender, no mass. Extremities: no edema. Skin: no edema  Neurologic Examination:                                                                                                      General: Mental Status: Alert, oriented, thought content appropriate.  Speech fluent without evidence of aphasia.  Able to follow 3 step commands without difficulty. Cranial Nerves: II: Discs flat bilaterally; neglecting the left side, pupils equal, round, reactive to light and accommodation III,IV, VI: ptosis not present, seems to have a left gaze preference. V,VII: smile symmetric, facial light touch sensation normal bilaterally VIII: hearing normal bilaterally IX,X: gag reflex present XI: bilateral shoulder shrug no  tested XII: midline tongue extension without atrophy or fasciculations Motor: Mild left arm weakness Tone and bulk:normal tone throughout; no atrophy noted Sensory: Pinprick and light touch impaired in the left most likely neglect Deep Tendon Reflexes:  Right: Upper Extremity   Left: Upper extremity   biceps (C-5 to C-6) 2/4   biceps (C-5 to C-6) 2/4 tricep (C7) 2/4    triceps (C7) 2/4 Brachioradialis (C6) 2/4  Brachioradialis (C6) 2/4  Lower Extremity Lower Extremity  quadriceps (L-2 to L-4) 2/4   quadriceps (L-2 to L-4) 2/4 Achilles (S1) 2/4   Achilles (S1) 2/4  Plantars: Right: downgoing   Left: downgoing Cerebellar: normal finger-to-nose,  normal heel-to-shin test Gait:  No tested due to multiple leads No meningeal irritation signs.  Lab Results  Component Value Date/Time   CHOL 131 07/06/2013 10:18 AM    Results for orders placed or performed during the hospital encounter of 06/09/14 (from the past 48 hour(s))  CBC     Status: Abnormal   Collection Time: 06/09/14  1:42 PM  Result Value Ref Range   WBC 17.5 (H) 4.0 - 10.5 K/uL   RBC 4.49 3.87 - 5.11 MIL/uL   Hemoglobin 12.0 12.0 - 15.0 g/dL   HCT 36.8 36.0 - 46.0 %   MCV 82.0 78.0 - 100.0 fL   MCH 26.7 26.0 - 34.0 pg   MCHC 32.6 30.0 - 36.0 g/dL   RDW 17.5 (H) 11.5 - 15.5 %   Platelets 1262 (HH) 150 - 400 K/uL    Comment: REPEATED TO VERIFY PLATELET COUNT CONFIRMED BY SMEAR SPECIMEN CHECKED FOR CLOTS CRITICAL RESULT CALLED TO, READ BACK BY AND VERIFIED WITH: ROBERSON,S RN 06/09/14 1454 [place]   Comprehensive metabolic panel     Status: Abnormal   Collection Time: 06/09/14  1:42 PM  Result Value Ref Range   Sodium 140 135 - 145 mmol/L   Potassium 3.5 3.5 - 5.1 mmol/L   Chloride 100 96 - 112 mmol/L   CO2 33 (H) 19 - 32 mmol/L   Glucose, Bld 96 70 - 99 mg/dL   BUN 11 6 - 23 mg/dL   Creatinine, Ser 1.32 (H) 0.50 - 1.10 mg/dL   Calcium 8.7 8.4 - 10.5 mg/dL  Total Protein 6.6 6.0 - 8.3 g/dL   Albumin 2.9  (L) 3.5 - 5.2 g/dL   AST 22 0 - 37 U/L   ALT 14 0 - 35 U/L   Alkaline Phosphatase 151 (H) 39 - 117 U/L   Total Bilirubin 0.7 0.3 - 1.2 mg/dL   GFR calc non Af Amer 42 (L) >90 mL/min   GFR calc Af Amer 48 (L) >90 mL/min    Comment: (NOTE) The eGFR has been calculated using the CKD EPI equation. This calculation has not been validated in all clinical situations. eGFR's persistently <90 mL/min signify possible Chronic Kidney Disease.    Anion gap 7 5 - 15  Urinalysis, Routine w reflex microscopic     Status: Abnormal   Collection Time: 06/09/14  5:40 PM  Result Value Ref Range   Color, Urine YELLOW YELLOW   APPearance CLOUDY (A) CLEAR   Specific Gravity, Urine 1.010 1.005 - 1.030   pH 7.5 5.0 - 8.0   Glucose, UA NEGATIVE NEGATIVE mg/dL   Hgb urine dipstick SMALL (A) NEGATIVE   Bilirubin Urine NEGATIVE NEGATIVE   Ketones, ur 15 (A) NEGATIVE mg/dL   Protein, ur NEGATIVE NEGATIVE mg/dL   Urobilinogen, UA 0.2 0.0 - 1.0 mg/dL   Nitrite NEGATIVE NEGATIVE   Leukocytes, UA SMALL (A) NEGATIVE  Urine microscopic-add on     Status: Abnormal   Collection Time: 06/09/14  5:40 PM  Result Value Ref Range   Squamous Epithelial / LPF FEW (A) RARE   WBC, UA 3-6 <3 WBC/hpf   Bacteria, UA RARE RARE    Ct Head Wo Contrast  06/09/2014   CLINICAL DATA:  Nausea and vomiting. Slurred speech. Confusion. Urinary tract infection. Nystagmus. Prior left nephrectomy and splenectomy 9 days ago at [facility] for renal cell carcinoma.  EXAM: CT HEAD WITHOUT CONTRAST  TECHNIQUE: Contiguous axial images were obtained from the base of the skull through the vertex without intravenous contrast.  COMPARISON:  None  FINDINGS: The cerebellum and brainstem appear unremarkable as do the thalami, basal ganglia, and ventricular system.  There is abnormal vasogenic edema posteriorly in the periventricular white matter and corona radiata. Possible 1.3 cm mass in the right parietal lobe, image 19 series 2. No overt intracranial  hyperdensity to favor intracranial hemorrhage. No destructive calvarial lesions identified.  IMPRESSION: 1. Considerably abnormal intracranial appearance with scattered abnormal white matter hypodensities in the periventricular white matter, corona radiata, and occipital and parietal lobes, with a possible mass lesion in the right parietal lobe. 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 Bilateral encephalitis or white matter infarcts not excluded. MRI brain with without contrast recommended (reduced dose of contrast may be warranted due to renal insufficiency).   Electronically Signed   By: Sherryl Barters M.D.   On: 06/09/2014 16:54   Assessment/Plan: 64 y/o with renal cell carcinoma status post nephrectomy and splenectomy performed at [facility] on 05/31/2014, brought in with acute onset confusion, disorientation, visual disturbances, and HA. Has mild left arm weakness, left neglect, and left gaze preference on exam, and CT brain with evidence of vasogenic edema posteriorly in the periventricular white matter and corona radiata and a possible 1.3 cm mass in the right parietal lobe. No hemorrhagic lesions. Major concern is metastatic brain disease but can not entirely exclude an inflammatory process. MRI brain with and without contrast already requested. Continue decadron 4 mg IV q 6 hours. Neurology will follow up.  Dorian Pod, MD 06/09/2014, 7:31 PM  [facility]

## 2014-06-09 NOTE — ED Provider Notes (Signed)
Patient with recent splenectomy and nephrectomy duce to [facility] 05/31/2014 at [facility] presents with altered mental status and left gaze preference. Awaiting CT results, neurology has been consulted.  Pt CT abnormal, [place] to discuss results with the patient, plan to admit to medicine and consult with neurology. 5:02 PM Discussed with Dr. Doy Mince who agrees to consult.  5:20 PM Discussed with hospitalist, agrees to admit the patient. Reevaluation discuss CT results, need for admission and MRI for further evaluation of possible metastatic lesions with the patient and patient's husband. Patient's husband reports patient's symptoms have declined since initial evaluation.   Results for orders placed or performed during the hospital encounter of 06/09/14  CBC  Result Value Ref Range   WBC 17.5 (H) 4.0 - 10.5 K/uL   RBC 4.49 3.87 - 5.11 MIL/uL   Hemoglobin 12.0 12.0 - 15.0 g/dL   HCT 36.8 36.0 - 46.0 %   MCV 82.0 78.0 - 100.0 fL   MCH 26.7 26.0 - 34.0 pg   MCHC 32.6 30.0 - 36.0 g/dL   RDW 17.5 (H) 11.5 - 15.5 %   Platelets 1262 (HH) 150 - 400 K/uL  Comprehensive metabolic panel  Result Value Ref Range   Sodium 140 135 - 145 mmol/L   Potassium 3.5 3.5 - 5.1 mmol/L   Chloride 100 96 - 112 mmol/L   CO2 33 (H) 19 - 32 mmol/L   Glucose, Bld 96 70 - 99 mg/dL   BUN 11 6 - 23 mg/dL   Creatinine, Ser 1.32 (H) 0.50 - 1.10 mg/dL   Calcium 8.7 8.4 - 10.5 mg/dL   Total Protein 6.6 6.0 - 8.3 g/dL   Albumin 2.9 (L) 3.5 - 5.2 g/dL   AST 22 0 - 37 U/L   ALT 14 0 - 35 U/L   Alkaline Phosphatase 151 (H) 39 - 117 U/L   Total Bilirubin 0.7 0.3 - 1.2 mg/dL   GFR calc non Af Amer 42 (L) >90 mL/min   GFR calc Af Amer 48 (L) >90 mL/min   Anion gap 7 5 - 15   Ct Head Wo Contrast  06/09/2014   CLINICAL DATA:  Nausea and vomiting. Slurred speech. Confusion. Urinary tract infection. Nystagmus. Prior left nephrectomy and splenectomy 9 days ago at [facility] for renal cell carcinoma.  EXAM: CT HEAD WITHOUT CONTRAST   TECHNIQUE: Contiguous axial images were obtained from the base of the skull through the vertex without intravenous contrast.  COMPARISON:  None  FINDINGS: The cerebellum and brainstem appear unremarkable as do the thalami, basal ganglia, and ventricular system.  There is abnormal vasogenic edema posteriorly in the periventricular white matter and corona radiata. Possible 1.3 cm mass in the right parietal lobe, image 19 series 2. No overt intracranial hyperdensity to favor intracranial hemorrhage. No destructive calvarial lesions identified.  IMPRESSION: 1. Considerably abnormal intracranial appearance with scattered abnormal white matter hypodensities in the periventricular white matter, corona radiata, and occipital and parietal lobes, with a possible mass lesion in the right parietal lobe. 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 Bilateral encephalitis or white matter infarcts not excluded. MRI brain with without contrast recommended (reduced dose of contrast may be warranted due to renal insufficiency).   Electronically Signed   By: Sherryl Barters M.D.   On: 06/09/2014 16:54     Harvie Heck, PA-C 06/09/14 7858  Ernestina Patches, MD 06/10/14 2154

## 2014-06-09 NOTE — ED Notes (Addendum)
Pt last seen normal last night before bed. Pt was placed on antibiotic last week for UTI after having kidney and spleen removed at [facility]. Pt husband at bedside. Pt able to follow some commands, others she cannot. Pt alert and oriented as to where she is at and what is going on. Pt perception is off. Pt confused about direction. Constantly looking to the left and states "I feel like I have to look that way."

## 2014-06-09 NOTE — ED Notes (Signed)
Pt and family reports pt had surgery on 1/15 at [facility]. Pt has recently been on antibiotics for UTI, began having n/v and confusion last night.

## 2014-06-09 NOTE — ED Notes (Signed)
Attempted to give report to 3S. RN getting report on floor will call back in 10 minutes.

## 2014-06-09 NOTE — H&P (Signed)
[facility] History and Physical  CARLYLE ACHENBACH EXH:[id number] DOB: 07-05-1950 DOA: 06/09/2014  Referring physician:  PCP: Lottie Dawson, MD   Chief Complaint: Mental status changes  HPI: Kendra Mcfarland is a 64 y.o. female  with a past medical history of renal cell carcinoma status post nephrectomy and splenectomy performed at [facility] on 05/31/2014. Her symptoms started yesterday afternoon where she started with blurry vision involving both eyes. This became progressively worse over the course of the afternoon. Towards evening her husband noted right-sided hemi-neglect, reporting that "she favored her left side." She also developed mental status changes, confusion, disorientation, forgetting how to get around in her own home. She denies fevers, chills, nausea, vomiting, abdominal pain, diarrhea, constipation, hematuria, dysuria. She was recently treated with doxycycline for urinary tract infection. CT scan of the brain without contrast performed in the emergency room showing scattered abnormal white matter hypodensities in the periventricular white matter, corona radiata, occipital and parietal lobes with mass lesion and right parietal lobe, findings consistent with intracranial metastatic disease. In the emergency room she was administered 10 mg of IV Decadron.   Review of Systems:  Review of systems Limited due to confusion  Past Medical History  Diagnosis Date  . Anemia     years ago  . Transfusion history     before her hysterectomy  . Anxiety   . Hyperlipidemia   . Hypertension   . Spinal stenosis   . Spondylosis     degenerative adult  . Bulging lumbar disc   . MONONUCLEOSIS 08/14/2007    Qualifier: Diagnosis of  Problem Stop Reason: Removed By: Hulan Saas, CMA (AAMA), [place] Skye   . Vertigo   . Iron disorder     excessive iron storage   . Cancer    Past Surgical History  Procedure Laterality Date  . Inguinal hernia repair      at birth  . Cesarean  section      x 2  . Appendectomy    . Abdominal hysterectomy      bleeding fibroid tumors has ovaries  . Vein protrusion in head as a child    . Blood transfusion from vaginal bleeding     Social History:  reports that she quit smoking about 20 years ago. She has never used smokeless tobacco. She reports that she drinks alcohol. She reports that she does not use illicit drugs.  Allergies  Allergen Reactions  . Tramadol Nausea And Vomiting  . Hydrocodone Nausea And Vomiting  . Naproxen Sodium     REACTION: rash/blisters  Can take advil   . Penicillins Hives  . Sulfonamide Derivatives     REACTION: rash/blisters/ dypsnea    Family History  Problem Relation Age of Onset  . Hypertension Mother   . Arthritis Mother   . Hyperlipidemia Mother   . Memory loss Mother 21    short term  . Other Mother 28    angioplasty  . Arthritis Father   . Hypertension Father   . Heart attack Father   . Hyperthyroidism Sister   . Anemia Sister   . Hypothyroidism Sister   . Hyperlipidemia Sister   . Colon cancer Neg Hx   . Esophageal cancer Neg Hx   . Rectal cancer Neg Hx   . Stomach cancer Neg Hx       Prior to Admission medications   Medication Sig Start Date End Date Taking? Authorizing Provider  acetaminophen (TYLENOL) 325 MG tablet Take 650 mg by  mouth every 6 (six) hours as needed for mild pain.    Yes Historical Provider, MD  cholecalciferol (VITAMIN D) 1000 UNITS tablet Take 1,000 Units by mouth daily.   Yes Historical Provider, MD  clonazePAM (KLONOPIN) 0.5 MG tablet Take 0.5 mg by mouth 2 (two) times daily. 04/29/14 04/29/15 Yes Historical Provider, MD  Cyanocobalamin (VITAMIN B-12) 5000 MCG SUBL Place 1 tablet under the tongue daily.   Yes Historical Provider, MD  docusate sodium (COLACE) 100 MG capsule Take 100 mg by mouth 2 (two) times daily. 06/05/14  Yes Historical Provider, MD  esomeprazole (NEXIUM) 40 MG capsule Take 1 capsule (40 mg total) by mouth daily before breakfast.  11/08/12  Yes Sable Feil, MD  lisinopril-hydrochlorothiazide (PRINZIDE,ZESTORETIC) 20-12.5 MG per tablet Take 1 tablet by mouth at bedtime.   Yes Historical Provider, MD  Multiple Vitamin (MULTIVITAMIN WITH MINERALS) TABS tablet Take 1 tablet by mouth daily.   Yes Historical Provider, MD  nitrofurantoin, macrocrystal-monohydrate, (MACROBID) 100 MG capsule Take 100 mg by mouth 2 (two) times daily. 06/08/14 06/15/14 Yes Historical Provider, MD  rosuvastatin (CRESTOR) 10 MG tablet Take 1 tablet (10 mg total) by mouth daily. 07/13/13  Yes Burnis Medin, MD  senna (SENOKOT) 8.6 MG TABS tablet Take 1 tablet by mouth daily. 06/05/14 07/05/14 Yes Historical Provider, MD   Physical Exam: Filed Vitals:   06/09/14 1530 06/09/14 1600 06/09/14 1704 06/09/14 1730  BP: 154/71 145/62 147/65 151/71  Pulse: 92 91  96  Temp:      TempSrc:      Resp: 29 30  28   SpO2: 95% 96%  89%    Wt Readings from Last 3 Encounters:  04/03/14 67.405 kg (148 lb 9.6 oz)  03/18/14 66.543 kg (146 lb 11.2 oz)  02/21/14 66.134 kg (145 lb 12.8 oz)    General:  Patient having right-sided neglect, has her face towards left side, having difficulties following commands Eyes: PERRL, normal lids, irises & conjunctiva. Patient having decreased visual acuity, could not identify number of fingers that I was holding ENT: grossly normal hearing, lips & tongue Neck: no LAD, masses or thyromegaly Cardiovascular: RRR, no m/r/g. No LE edema. Telemetry: SR, no arrhythmias  Respiratory: CTA bilaterally, no w/r/r. Normal respiratory effort. Abdomen: soft, ntnd Skin: no rash or induration seen on limited exam Musculoskeletal: grossly normal tone BUE/BLE Psychiatric: grossly normal mood and affect, speech fluent and appropriate Neurologic: Decreased visual acuity, having right-sided hemi-neglect. Patient having bilateral pupils 3-59mm, both were reactive to light. I did not notes facial droop or slurred speech. She had difficulties following  commands to perform neurologic exam. She appeared to have 3-4/5 left lower extremity weakness, with decreased grip strength to her left hand .           Labs on Admission:  Basic Metabolic Panel:  Recent Labs Lab 06/09/14 1342  NA 140  K 3.5  CL 100  CO2 33*  GLUCOSE 96  BUN 11  CREATININE 1.32*  CALCIUM 8.7   Liver Function Tests:  Recent Labs Lab 06/09/14 1342  AST 22  ALT 14  ALKPHOS 151*  BILITOT 0.7  PROT 6.6  ALBUMIN 2.9*   No results for input(s): LIPASE, AMYLASE in the last 168 hours. No results for input(s): AMMONIA in the last 168 hours. CBC:  Recent Labs Lab 06/09/14 1342  WBC 17.5*  HGB 12.0  HCT 36.8  MCV 82.0  PLT 1262*   Cardiac Enzymes: No results for input(s): CKTOTAL, CKMB, CKMBINDEX, TROPONINI  in the last 168 hours.  BNP (last 3 results) No results for input(s): PROBNP in the last 8760 hours. CBG: No results for input(s): GLUCAP in the last 168 hours.  Radiological Exams on Admission: Ct Head Wo Contrast  06/09/2014   CLINICAL DATA:  Nausea and vomiting. Slurred speech. Confusion. Urinary tract infection. Nystagmus. Prior left nephrectomy and splenectomy 9 days ago at [facility] for renal cell carcinoma.  EXAM: CT HEAD WITHOUT CONTRAST  TECHNIQUE: Contiguous axial images were obtained from the base of the skull through the vertex without intravenous contrast.  COMPARISON:  None  FINDINGS: The cerebellum and brainstem appear unremarkable as do the thalami, basal ganglia, and ventricular system.  There is abnormal vasogenic edema posteriorly in the periventricular white matter and corona radiata. Possible 1.3 cm mass in the right parietal lobe, image 19 series 2. No overt intracranial hyperdensity to favor intracranial hemorrhage. No destructive calvarial lesions identified.  IMPRESSION: 1. Considerably abnormal intracranial appearance with scattered abnormal white matter hypodensities in the periventricular white matter, corona radiata, and occipital  and parietal lobes, with a possible mass lesion in the right parietal lobe. 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 Bilateral encephalitis or white matter infarcts not excluded. MRI brain with without contrast recommended (reduced dose of contrast may be warranted due to renal insufficiency).   Electronically Signed   By: Sherryl Barters M.D.   On: 06/09/2014 16:54    EKG: Independently reviewed.   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 Patient with history of left renal mass, recently admitted to [facility] on 05/31/2014 discharge and 06/05/2014 at which time she underwent radical nephrectomy and splenectomy. She was diagnosed with renal cell carcinoma. She now presents with confusion, disorientation, right-sided hemi-neglect. Unfortunately CT scan of brain showing findings consistent with metastatic renal cell carcinoma. She was given 10 mg of IV Decadron in the emergency room, will continue Decadron at 4 mg IV every 6 hours. Patient will need radiation oncology involved for consideration of whole brain radiation. Neurology was consulted. Plan for MRI of brain with and without contrast. Will admit patient to stepdown unit. 2. Acute kidney injury. Initial labs showing a creatinine 1.32, increased from 0.9 on previous lab work. Will will give IV fluid challenge with normal saline, follow-up on a.m. lab work. Hold lisinopril and hydrocodone thiazide therapy for now. 3. Thrombocytosis. Patient presenting with a platelet count of 1,262,000. Could be secondary to splenectomy, underlying malignancy or perhaps due to reactive thrombocytosis from inflammation. 4. Hypertension. Will discontinue hydrochlorothiazide and lisinopril due to acute  kidney injury. Start Norvasc 5 mg by mouth daily, monitor blood pressures. 5. DVT prophylaxis. Bilateral extremity SCDs   Code Status: Full code Family Communication: Spoke to her husband was present at bedside Disposition Plan: Admitted to stepdown unit, to which she'll require greater than 2 night hospitalization  Time spent: 70 min  Kelvin Cellar [facility] Pager [phone]

## 2014-06-10 ENCOUNTER — Inpatient Hospital Stay (HOSPITAL_COMMUNITY): Payer: BLUE CROSS/BLUE SHIELD

## 2014-06-10 LAB — BASIC METABOLIC PANEL
Anion gap: 15 (ref 5–15)
BUN: 14 mg/dL (ref 6–23)
CALCIUM: 8.5 mg/dL (ref 8.4–10.5)
CHLORIDE: 102 mmol/L (ref 96–112)
CO2: 23 mmol/L (ref 19–32)
CREATININE: 1.32 mg/dL — AB (ref 0.50–1.10)
GFR calc non Af Amer: 42 mL/min — ABNORMAL LOW (ref >90)
GFR, EST AFRICAN AMERICAN: 48 mL/min — AB (ref >90)
Glucose, Bld: 111 mg/dL — ABNORMAL HIGH (ref 70–99)
Potassium: 4.1 mmol/L (ref 3.5–5.1)
SODIUM: 140 mmol/L (ref 135–145)

## 2014-06-10 LAB — URINALYSIS, ROUTINE W REFLEX MICROSCOPIC
Glucose, UA: NEGATIVE mg/dL
Ketones, ur: 15 mg/dL — AB
Leukocytes, UA: NEGATIVE
NITRITE: NEGATIVE
PH: 6 (ref 5.0–8.0)
PROTEIN: 30 mg/dL — AB
SPECIFIC GRAVITY, URINE: 1.021 (ref 1.005–1.030)
UROBILINOGEN UA: 1 mg/dL (ref 0.0–1.0)

## 2014-06-10 LAB — CBC
HCT: 34.8 % — ABNORMAL LOW (ref 36.0–46.0)
HEMOGLOBIN: 11 g/dL — AB (ref 12.0–15.0)
MCH: 26.3 pg (ref 26.0–34.0)
MCHC: 31.6 g/dL (ref 30.0–36.0)
MCV: 83.1 fL (ref 78.0–100.0)
PLATELETS: 1221 10*3/uL — AB (ref 150–400)
RBC: 4.19 MIL/uL (ref 3.87–5.11)
RDW: 17.7 % — ABNORMAL HIGH (ref 11.5–15.5)
WBC: 13.7 10*3/uL — AB (ref 4.0–10.5)

## 2014-06-10 LAB — URINE MICROSCOPIC-ADD ON

## 2014-06-10 LAB — MRSA PCR SCREENING: MRSA by PCR: NEGATIVE

## 2014-06-10 MED ORDER — VITAMIN B-12 1000 MCG PO TABS
5000.0000 ug | ORAL_TABLET | Freq: Every day | ORAL | Status: DC
  Administered 2014-06-10 – 2014-06-14 (×5): 5000 ug via ORAL
  Filled 2014-06-10 (×5): qty 5

## 2014-06-10 MED ORDER — SENNA 8.6 MG PO TABS
1.0000 | ORAL_TABLET | Freq: Every day | ORAL | Status: DC
  Filled 2014-06-10 (×5): qty 1

## 2014-06-10 MED ORDER — GADOBENATE DIMEGLUMINE 529 MG/ML IV SOLN
7.0000 mL | Freq: Once | INTRAVENOUS | Status: AC | PRN
  Administered 2014-06-10: 7 mL via INTRAVENOUS

## 2014-06-10 MED ORDER — LORAZEPAM 2 MG/ML IJ SOLN
1.0000 mg | Freq: Once | INTRAMUSCULAR | Status: AC
  Administered 2014-06-10: 1 mg via INTRAVENOUS

## 2014-06-10 MED ORDER — LORAZEPAM 2 MG/ML IJ SOLN
INTRAMUSCULAR | Status: AC
  Filled 2014-06-10: qty 1

## 2014-06-10 MED ORDER — LORAZEPAM 2 MG/ML IJ SOLN
1.0000 mg | Freq: Once | INTRAMUSCULAR | Status: AC
  Administered 2014-06-10: 1 mg via INTRAVENOUS
  Filled 2014-06-10: qty 1

## 2014-06-10 MED ORDER — VITAMIN B-12 5000 MCG SL SUBL: 1.0000 | SUBLINGUAL_TABLET | Freq: Every day | SUBLINGUAL | Status: DC

## 2014-06-10 MED ORDER — CLONAZEPAM 0.5 MG PO TABS
0.5000 mg | ORAL_TABLET | Freq: Two times a day (BID) | ORAL | Status: DC
  Administered 2014-06-10 – 2014-06-14 (×8): 0.5 mg via ORAL
  Filled 2014-06-10 (×8): qty 1

## 2014-06-10 MED ORDER — DOCUSATE SODIUM 100 MG PO CAPS
100.0000 mg | ORAL_CAPSULE | Freq: Two times a day (BID) | ORAL | Status: DC
  Administered 2014-06-10: 100 mg via ORAL
  Filled 2014-06-10 (×7): qty 1

## 2014-06-10 NOTE — Progress Notes (Signed)
Subjective: Unsure if confusion has imporved. Gaze preference has improved.   Exam: Filed Vitals:   06/10/14 0759  BP: 144/69  Pulse: 94  Temp: 98.2 F (36.8 C)  Resp: 16   Gen: In bed, NAD MS: Awake, alert, interactive and appropriate HN:GITJL left hemianopia. EOMI Motor: MAEW Sensory:intact to LT. Does not extinguish to DSS.    Impression: 64 yo F presenting with AMS with MRI findings most consistent with PRES. She was just started on Doxycycline, though I am not sure of a clear association with PRES, the temporal correlation is concerning. She did receive blood transfusions during her last hospital stay which have also been associated with PRES.    Recommendations: 1) MRA to look for vasospasm suggestive of reversible cerebral vasospasm syndrome.  2) Agree with restarting BP medications.    Roland Rack, MD [facility] [phone]  If 7pm- 7am, please page neurology on call as listed in [place].

## 2014-06-10 NOTE — Progress Notes (Signed)
[place] TEAM 1 - Stepdown/ICU TEAM Progress Note  Kendra Mcfarland SHF:[id number] DOB: 1951-03-16 DOA: 06/09/2014 PCP: Lottie Dawson, MD  Admit HPI / Brief Narrative: 64 y.o. femalewith a history of renal cell carcinoma status post nephrectomy and splenectomy performed at [facility] on 05/31/2014. Her symptoms started 1/23 afternoon when she started with blurry vision involving both eyes. This became progressively worse over the course of the afternoon. Towards that evening her husband noted right-sided hemi-neglect, reporting that "she favored her left side." She also developed mental status changes, confusion, disorientation, forgetting how to get around in her own home. She was recently treated with doxycycline for a urinary tract infection. CT scan of the brain without contrast performed in the emergency room noted scattered abnormal hypodensities in the periventricular white matter, corona radiata, occipital and parietal lobes with mass lesion in the right parietal lobe, consistent with intracranial metastatic disease. In the emergency room she was administered 10 mg of IV Decadron.  HPI/Subjective: Pt resting comfortably at the time of my visit.  Husband reports that she appers to be clearing from a mental status standpoint. The pt herself stater her HA has improved markedly.  She denies cp, n/v, abdom pain, or sob.    Assessment/Plan:  Mental status changes - focal neurologic deficits - PRES v/s metastatic renal cell CA Based upon MRI findings Neuro now suspects this represents PRES - follow Neuro recs - monitor    Acute kidney injury Baseline crt 0.9 - crt at presentation 1.32 - hydrate gently and follow trend   Thrombocytosis secondary to recent splenectomy, underlying malignancy, or perhaps due to reactive thrombocytosis from inflammation - follow   HLD   HTN BP reasonably well controlled at this time - follow w/o change in med tx presently  Code Status: FULL Family  Communication: spoke w/ husband at bedside  Disposition Plan: SDU   Consultants: Neurology   Procedures: none  Antibiotics: none  DVT prophylaxis: SCDs  Objective: Blood pressure 139/59, pulse 89, temperature 98.4 F (36.9 C), temperature source Axillary, resp. rate 22, height 5\' 1"  (5.027 m), weight 69 kg (152 lb 1.9 oz), SpO2 92 %.  Intake/Output Summary (Last 24 hours) at 06/10/14 1612 Last data filed at 06/10/14 0800  Gross per 24 hour  Intake  667.5 ml  Output    400 ml  Net  267.5 ml   Exam: General: No acute respiratory distress Lungs: Clear to auscultation bilaterally without wheezes or crackles Cardiovascular: Regular rate and rhythm without murmur gallop or rub normal S1 and S2 Abdomen: Nontender, nondistended, soft, bowel sounds positive, no rebound, no ascites, no appreciable mass Extremities: No significant cyanosis, clubbing, or edema bilateral lower extremities  Data Reviewed: Basic Metabolic Panel:  Recent Labs Lab 06/09/14 1342 06/10/14 0643  NA 140 140  K 3.5 4.1  CL 100 102  CO2 33* 23  GLUCOSE 96 111*  BUN 11 14  CREATININE 1.32* 1.32*  CALCIUM 8.7 8.5    Liver Function Tests:  Recent Labs Lab 06/09/14 1342  AST 22  ALT 14  ALKPHOS 151*  BILITOT 0.7  PROT 6.6  ALBUMIN 2.9*   CBC:  Recent Labs Lab 06/09/14 1342 06/10/14 0643  WBC 17.5* 13.7*  HGB 12.0 11.0*  HCT 36.8 34.8*  MCV 82.0 83.1  PLT 1262* 1221*    Recent Results (from the past 240 hour(s))  MRSA PCR Screening     Status: None   Collection Time: 06/10/14  1:48 AM  Result Value  Ref Range Status   MRSA by PCR NEGATIVE NEGATIVE Final    Comment:        The GeneXpert MRSA Assay (FDA approved for NASAL specimens only), is one component of a comprehensive MRSA colonization surveillance program. It is not intended to diagnose MRSA infection nor to guide or monitor treatment for MRSA infections.      Studies:  Recent x-ray studies have been reviewed in  detail by the Attending Physician  Scheduled Meds:  Scheduled Meds: . amLODipine  5 mg Oral Daily  . ondansetron (ZOFRAN) IV  4 mg Intravenous Once  . sodium chloride  3 mL Intravenous Q12H    Time spent on care of this patient: 35 mins   Benjimen Kelley T , MD   [facility] Office  [phone] Pager - Text Page per Shea Evans as per below:  On-Call/Text Page:      [email or web address]      password [email or web address]  If 7PM-7AM, please contact night-coverage [email or web address] Password [email or web address] 06/10/2014, 4:12 PM   LOS: 1 day

## 2014-06-10 NOTE — Progress Notes (Signed)
Utilization Review Completed.Donne Anon T1/25/2016

## 2014-06-11 DIAGNOSIS — E785 Hyperlipidemia, unspecified: Secondary | ICD-10-CM

## 2014-06-11 DIAGNOSIS — C642 Malignant neoplasm of left kidney, except renal pelvis: Secondary | ICD-10-CM

## 2014-06-11 DIAGNOSIS — N179 Acute kidney failure, unspecified: Secondary | ICD-10-CM | POA: Diagnosis present

## 2014-06-11 DIAGNOSIS — I6783 Posterior reversible encephalopathy syndrome: Secondary | ICD-10-CM | POA: Diagnosis present

## 2014-06-11 LAB — CBC
HEMATOCRIT: 32.7 % — AB (ref 36.0–46.0)
HEMOGLOBIN: 10.2 g/dL — AB (ref 12.0–15.0)
MCH: 25.7 pg — ABNORMAL LOW (ref 26.0–34.0)
MCHC: 31.2 g/dL (ref 30.0–36.0)
MCV: 82.4 fL (ref 78.0–100.0)
PLATELETS: 1219 10*3/uL — AB (ref 150–400)
RBC: 3.97 MIL/uL (ref 3.87–5.11)
RDW: 17.9 % — ABNORMAL HIGH (ref 11.5–15.5)
WBC: 15.3 10*3/uL — ABNORMAL HIGH (ref 4.0–10.5)

## 2014-06-11 LAB — BASIC METABOLIC PANEL
Anion gap: 7 (ref 5–15)
BUN: 20 mg/dL (ref 6–23)
CO2: 29 mmol/L (ref 19–32)
CREATININE: 1.41 mg/dL — AB (ref 0.50–1.10)
Calcium: 8.4 mg/dL (ref 8.4–10.5)
Chloride: 103 mmol/L (ref 96–112)
GFR calc non Af Amer: 38 mL/min — ABNORMAL LOW (ref >90)
GFR, EST AFRICAN AMERICAN: 45 mL/min — AB (ref >90)
Glucose, Bld: 97 mg/dL (ref 70–99)
Potassium: 4.2 mmol/L (ref 3.5–5.1)
Sodium: 139 mmol/L (ref 135–145)

## 2014-06-11 LAB — PATHOLOGIST SMEAR REVIEW

## 2014-06-11 MED ORDER — LIDOCAINE 5 % EX PTCH
1.0000 | MEDICATED_PATCH | CUTANEOUS | Status: DC
  Administered 2014-06-11 – 2014-06-12 (×2): 1 via TRANSDERMAL
  Filled 2014-06-11 (×4): qty 1

## 2014-06-11 NOTE — Evaluation (Signed)
Physical Therapy Evaluation Patient Details Name: Kendra Mcfarland MRN: [id number] DOB: 04-Dec-1950 Today's Date: 06/11/2014   History of Present Illness  64 y.o. presenting with AMS with MRI findings most consistent with PRES.  Clinical Impression  Patient demonstrates deficits in functional mobility as indicated below. Will need continued skilled PT to address deficits and maximize function. Patient has good family support, anticipate patient will continue to progress well with mobility. Will see as indicated and progress as tolerated. Recommend assist for mobility at this time.     Follow Up Recommendations Home health PT;Supervision/Assistance - 24 hour (pending progress, may not need HHPT )    Equipment Recommendations  Rolling walker with 5" wheels    Recommendations for Other Services       Precautions / Restrictions Precautions Precautions: Fall Restrictions Weight Bearing Restrictions: No      Mobility  Bed Mobility Overal bed mobility: Needs Assistance Bed Mobility: Supine to Sit     Supine to sit: Min assist     General bed mobility comments: VCs for positioning, assist to elevate trunk to EOB  Transfers Overall transfer level: Needs assistance   Transfers: Sit to/from Stand;Stand Pivot Transfers Sit to Stand: Min assist;Min guard Stand pivot transfers: Min assist       General transfer comment: VCs and tactile cues for UE positioning and handplacement (moreso on side compared to left.  Ambulation/Gait Ambulation/Gait assistance: Min assist Ambulation Distance (Feet): 110 Feet Assistive device: 1 person hand held assist (pushing IV pole) Gait Pattern/deviations: Step-through pattern;Decreased stride length;Drifts right/left;Narrow base of support Gait velocity: decreased Gait velocity interpretation: Below normal speed for age/gender General Gait Details: instability noted with ambulation, improved with HHA compared to pushing IV pole. May benefit from  use of RW,  Stairs            Wheelchair Mobility    Modified Rankin (Stroke Patients Only)       Balance Overall balance assessment: Needs assistance   Sitting balance-Leahy Scale: Good       Standing balance-Leahy Scale: Poor Standing balance comment: instability noted, patient reaching out with poor depth perception to stabilize                             Pertinent Vitals/Pain Pain Assessment: No/denies pain    Home Living Family/patient expects to be discharged to:: Private residence Living Arrangements: Spouse/significant other Available Help at Discharge: Family;Available 24 hours/day Type of Home: House Home Access: Stairs to enter Entrance Stairs-Rails: None Entrance Stairs-Number of Steps: 1 and 3  Home Layout: Two level;Able to live on main level with bedroom/bathroom        Prior Function Level of Independence: Independent               Hand Dominance        Extremity/Trunk Assessment   Upper Extremity Assessment: Defer to OT evaluation           Lower Extremity Assessment:  (decreased coordination bilaterally)         Communication   Communication: No difficulties  Cognition Arousal/Alertness: Awake/alert Behavior During Therapy: WFL for tasks assessed/performed Overall Cognitive Status: Impaired/Different from baseline Area of Impairment: Attention;Memory;Following commands;Safety/judgement;Problem solving   Current Attention Level: Sustained Memory: Decreased short-term memory Following Commands: Follows one step commands inconsistently (often reversing left from right) Safety/Judgement: Decreased awareness of safety   Problem Solving: Slow processing;Difficulty sequencing;Requires verbal cues      General Comments  Exercises        Assessment/Plan    PT Assessment Patient needs continued PT services  PT Diagnosis Difficulty walking;Abnormality of gait;Altered mental status   PT Problem  List Decreased activity tolerance;Decreased balance;Decreased mobility;Decreased coordination;Decreased safety awareness  PT Treatment Interventions DME instruction;Gait training;Stair training;Functional mobility training;Therapeutic activities;Therapeutic exercise;Balance training;Cognitive remediation;Patient/family education   PT Goals (Current goals can be found in the Care Plan section) Acute Rehab PT Goals Patient Stated Goal: not stated PT Goal Formulation: With patient Time For Goal Achievement: 06/25/14 Potential to Achieve Goals: Good    Frequency Min 3X/week   Barriers to discharge        Co-evaluation               End of Session Equipment Utilized During Treatment: Gait belt Activity Tolerance: Patient tolerated treatment well Patient left: in chair;with call bell/phone within reach;with family/visitor present Nurse Communication: Mobility status         Time: 1522-1550 PT Time Calculation (min) (ACUTE ONLY): 28 min   Charges:   PT Evaluation $Initial PT Evaluation Tier I: 1 Procedure PT Treatments $Gait Training: 8-22 mins   PT G CodesDuncan Dull June 25, 2014, 6:42 PM Alben Deeds, [place] DPT  [phone]

## 2014-06-11 NOTE — Progress Notes (Signed)
Subjective: Very anxious this am.    Exam: Filed Vitals:   06/11/14 0705  BP: 144/68  Pulse: 98  Temp:   Resp: 24   Gen: In bed, NAD MS: Awake, alert, interactive and appropriate CN: She has movement perception in both the upper and lower left field today. EOMI Motor: MAEW Sensory:intact to LT.    Impression: 64 yo F presenting with AMS with MRI findings most consistent with PRES. She did receive blood transfusions during her last hospital stay which have also been associated with PRES. I was not abel to find any correlation to PRES with doxycycline, but blood transfusions, recent renal worsening, hypertension can all be associated. I also wonder if infection(e.g. Uti) may also contribute. We are already seeing some improvement in her visual fields and I expect her improvement to continue.    Recommendations: 1) continue BP control. 2) will continue to follow.   Roland Rack, MD [facility] [phone]  If 7pm- 7am, please page neurology on call as listed in [place].

## 2014-06-11 NOTE — Progress Notes (Signed)
[place] TEAM 1 - Stepdown/ICU TEAM Progress Note  Kendra Mcfarland OEV:[id number] DOB: 08/28/1950 DOA: 06/09/2014 PCP: Lottie Dawson, MD  Admit HPI / Brief Narrative: 64 y.o. femalewith PMHx anemia, anxiety, HLD, HTN, spinal stenosis, bulging lumbar disc, vertigo, Renal cell carcinoma S/P nephrectomy and splenectomy performed at [facility] on 05/31/2014. Her symptoms started 1/23 afternoon when she started with blurry vision involving both eyes. This became progressively worse over the course of the afternoon. Towards that evening her husband noted right-sided hemi-neglect, reporting that "she favored her left side." She also developed mental status changes, confusion, disorientation, forgetting how to get around in her own home. She was recently treated with doxycycline for a urinary tract infection. CT scan of the brain without contrast performed in the emergency room noted scattered abnormal hypodensities in the periventricular white matter, corona radiata, occipital and parietal lobes with mass lesion in the right parietal lobe, consistent with intracranial metastatic disease. In the emergency room she was administered 10 mg of IV Decadron.   HPI/Subjective: 1/26 A/O 4, negative CP, negative SOB, negative headache. Patient is anxious about how long her recovery may take to regain previous functionality.  Assessment/Plan: Acute encephalopathy - focal neurologic deficits - PRES v/s metastatic renal cell CA -Based upon MRI findings Neuro now suspects this represents PRES  - follow Neuro recs - monitor   Acute kidney injury -Baseline crt 0.9  - crt at presentation 1.32  - hydrate gently; continue normal saline 34ml/hr -Strict in and out -Daily a.m. weight   Thrombocytosis -secondary to recent splenectomy, underlying malignancy, or perhaps due to reactive thrombocytosis from inflammation -Trending down   HLD -Obtain lipid panel   HTN -BP reasonably well controlled at  this time - follow w/o change in med tx presently -Obtain echocardiogram   Code Status: FULL Family Communication: Son present at time of exam Disposition Plan: CIR vs SNF    Consultants: Dr.McNeill Leonel Ramsay (neurology)    Procedure/Significant Events: 04/10/14 CT abdomen pelvis with contrast;Large left renal mass measuring just over 8 cm in maximum diameter; and golfing majority of the posterior left kidney.  -enhancing tumor extending into an upper pole segmental vein and just into the main left renal vein. (Renal cell carcinoma)  1/24 CT head without contrast;- scattered abnormal white matter hypodensities in the periventricular white matter, corona radiata, and occipital and parietal lobes -possible mass lesion in the right parietal lobe.  -Bilateral encephalitis or white matter infarcts not excluded.  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 more notable on left where there are areas of patchy enhancement and possibly petechial hemorrhage.    Culture NA  Antibiotics: NA  DVT prophylaxis: SCD   Devices   LINES / TUBES:      Continuous Infusions: . sodium chloride 60 mL/hr at 06/10/14 2000    Objective: VITAL SIGNS: Temp: 97.6 F (36.4 C) (01/26 1100) Temp Source: Oral (01/26 1100) BP: 144/59 mmHg (01/26 1150) Pulse Rate: 128 (01/26 1150) SPO2; FIO2:   Intake/Output Summary (Last 24 hours) at 06/11/14 1526 Last data filed at 06/11/14 1330  Gross per 24 hour  Intake   2260 ml  Output    650 ml  Net   1610 ml     Exam: General: A/O 4, NAD, sitting in chair comfortably, No acute respiratory distress Lungs: Clear to auscultation bilaterally without wheezes or crackles Cardiovascular: Regular rate and rhythm without murmur gallop or rub normal S1 and S2  Abdomen: Nontender, nondistended, soft, bowel sounds positive, no rebound, no ascites, no appreciable  mass Extremities: No significant cyanosis, clubbing, or edema bilateral lower extremities Neurologic; cranial nerves II through XII intact, tongue/uvula midline, extremity strength 5/5, sensation intact throughout, past-pointing finger nose finger test bilateral Rt >Lt. Patient ambulated without symptoms of dizziness, shuffling gait.  Data Reviewed: Basic Metabolic Panel:  Recent Labs Lab 06/09/14 1342 06/10/14 0643 06/11/14 0323  NA 140 140 139  K 3.5 4.1 4.2  CL 100 102 103  CO2 33* 23 29  GLUCOSE 96 111* 97  BUN 11 14 20   CREATININE 1.32* 1.32* 1.41*  CALCIUM 8.7 8.5 8.4   Liver Function Tests:  Recent Labs Lab 06/09/14 1342  AST 22  ALT 14  ALKPHOS 151*  BILITOT 0.7  PROT 6.6  ALBUMIN 2.9*   No results for input(s): LIPASE, AMYLASE in the last 168 hours. No results for input(s): AMMONIA in the last 168 hours. CBC:  Recent Labs Lab 06/09/14 1342 06/10/14 0643 06/11/14 0323  WBC 17.5* 13.7* 15.3*  HGB 12.0 11.0* 10.2*  HCT 36.8 34.8* 32.7*  MCV 82.0 83.1 82.4  PLT 1262* 1221* 1219*   Cardiac Enzymes: No results for input(s): CKTOTAL, CKMB, CKMBINDEX, TROPONINI in the last 168 hours. BNP (last 3 results) No results for input(s): PROBNP in the last 8760 hours. CBG: No results for input(s): GLUCAP in the last 168 hours.  Recent Results (from the past 240 hour(s))  MRSA PCR Screening     Status: None   Collection Time: 06/10/14  1:48 AM  Result Value Ref Range Status   MRSA by PCR NEGATIVE NEGATIVE Final    Comment:        The GeneXpert MRSA Assay (FDA approved for NASAL specimens only), is one component of a comprehensive MRSA colonization surveillance program. It is not intended to diagnose MRSA infection nor to guide or monitor treatment for MRSA infections.      Studies:  Recent x-ray studies have been reviewed in detail by the Attending Physician  Scheduled Meds:  Scheduled Meds: . amLODipine  5 mg Oral Daily  . clonazePAM  0.5 mg  Oral BID  . docusate sodium  100 mg Oral BID  . ondansetron (ZOFRAN) IV  4 mg Intravenous Once  . senna  1 tablet Oral Daily  . sodium chloride  3 mL Intravenous Q12H  . vitamin B-12  5,000 mcg Oral Daily    Time spent on care of this patient: 40 mins   Allie Bossier [facility]  [facility] Office  [phone] Pager - [phone]  On-Call/Text Page:      [email or web address]      password [email or web address]  If 7PM-7AM, please contact night-coverage [email or web address] Password [email or web address] 06/11/2014, 3:26 PM   LOS: 2 days    Care during the described time interval was provided by me . I have reviewed this patient's available data, including medical history, events of note, physical examination, radiology studies and test results as part of my evaluation  Dia Crawford, MD [phone] Pager

## 2014-06-11 NOTE — Evaluation (Addendum)
Occupational Therapy Evaluation Patient Details Name: Kendra Mcfarland MRN: [id number] DOB: 1950-11-22 Today's Date: 06/11/2014    History of Present Illness 64 y.o. presenting with AMS with MRI findings most consistent with PRES.   Clinical Impression   Pt admitted with above. Feel pt will benefit from acute OT to increase independence with BADLs, prior to d/c.     Follow Up Recommendations  No OT follow up;Supervision/Assistance - 24 hour    Equipment Recommendations  Other (comment) (tbd)    Recommendations for Other Services       Precautions / Restrictions Precautions Precautions: Fall Restrictions Weight Bearing Restrictions: No      Mobility Bed Mobility Overal bed mobility: Needs Assistance Bed Mobility: Supine to Sit     Supine to sit: Mod assist     General bed mobility comments: assist with trunk. cues for technique.  Transfers Overall transfer level: Needs assistance   Transfers: Sit to/from Stand Sit to Stand: Min assist;Min guard         General transfer comment: cues for hand placement.         ADL Overall ADL's : Needs assistance/impaired     Grooming: Wash/dry face;Oral care;Applying deodorant;Minimal assistance;Standing;Brushing hair   Upper Body Bathing: Set up;Supervision/ safety;Standing   Lower Body Bathing: Min guard;Sit to/from stand       Lower Body Dressing: Minimal assistance;Sit to/from stand   Toilet Transfer: Minimal assistance;Ambulation (chair/bed)           Functional mobility during ADLs: Minimal assistance General ADL Comments: Pt performed ADLs at sink. Pt with apparent cognitive deficits. Assist to put toothpaste on toothbrush. Cues to remind pt what she was doing. Pt having difficulty reaching for objects.  pt did have loss of balance at sink requiring Min-Mod A on one occasion. Discussed d/c recommendation with spouse.     Vision  Pt wears glasses all the time  Pt reports difficulty with depth  perception.  Attempted to test vision however difficult to assess due to cognition. Inconsistent with visual field testing (missed several on left side and had difficulty locating items on left at sink)                     Perception     Praxis      Pertinent Vitals/Pain Pain Assessment: 0-10 Pain Score: 4  Pain Location: left side Pain Intervention(s): Monitored during session;Repositioned     Hand Dominance     Extremity/Trunk Assessment Upper Extremity Assessment Upper Extremity Assessment: Generalized weakness;RUE deficits/detail;LUE deficits/detail RUE Coordination: decreased gross motor LUE Coordination: decreased gross motor   Lower Extremity Assessment Lower Extremity Assessment: Defer to PT evaluation       Communication Communication Communication: No difficulties   Cognition Arousal/Alertness: Awake/alert Behavior During Therapy: WFL for tasks assessed/performed Overall Cognitive Status: Impaired/Different from baseline Area of Impairment: Attention;Memory;Following commands;Safety/judgement;Problem solving   Current Attention Level: Sustained Memory: Decreased short-term memory Following Commands: Follows one step commands inconsistently Safety/Judgement: Decreased awareness of safety   Problem Solving: Slow processing;Difficulty sequencing;Requires verbal cues     General Comments       Exercises       Shoulder Instructions      Home Living Family/patient expects to be discharged to:: Private residence Living Arrangements: Spouse/significant other Available Help at Discharge: Family;Available 24 hours/day Type of Home: House Home Access: Stairs to enter [employer] of Steps: 1 and 3  Entrance Stairs-Rails: None Home Layout: Two level;Able to live on main level  with bedroom/bathroom     Bathroom Shower/Tub: Occupational psychologist:  (has both)                Prior Functioning/Environment Level of  Independence: Independent             OT Diagnosis: Altered mental status;Generalized weakness   OT Problem List: Decreased strength;Decreased activity tolerance;Impaired balance (sitting and/or standing);Pain;Decreased knowledge of precautions;Decreased knowledge of use of DME or AE;Decreased cognition;Decreased coordination;Impaired vision/perception;Decreased safety awareness   OT Treatment/Interventions: Self-care/ADL training;Therapeutic exercise;DME and/or AE instruction;Therapeutic activities;Cognitive remediation/compensation;Visual/perceptual remediation/compensation;Patient/family education;Balance training    OT Goals(Current goals can be found in the care plan section) Acute Rehab OT Goals Patient Stated Goal: not stated OT Goal Formulation: With patient/family Time For Goal Achievement: 06/18/14 Potential to Achieve Goals: Good ADL Goals Pt Will Perform Grooming: with modified independence;standing Pt Will Perform Lower Body Bathing: with modified independence;sit to/from stand Pt Will Perform Lower Body Dressing: with modified independence;sit to/from stand Pt Will Transfer to Toilet: with modified independence;ambulating  OT Frequency: Min 2X/week   Barriers to D/C:            Co-evaluation              End of Session Equipment Utilized During Treatment: Gait belt Nurse Communication: Mobility status  Activity Tolerance: Patient tolerated treatment well Patient left: in chair;with call bell/phone within reach;with family/visitor present   Time: 3220-2542 OT Time Calculation (min): 34 min Charges:  OT General Charges $OT Visit: 1 Procedure OT Evaluation $Initial OT Evaluation Tier I: 1 Procedure OT Treatments $Self Care/Home Management : 8-22 mins G-CodesBenito Mccreedy OTR/L [phone] 06/11/2014, 1:14 PM

## 2014-06-12 DIAGNOSIS — I6789 Other cerebrovascular disease: Secondary | ICD-10-CM

## 2014-06-12 LAB — MAGNESIUM: Magnesium: 2.1 mg/dL (ref 1.5–2.5)

## 2014-06-12 LAB — URINE CULTURE
COLONY COUNT: NO GROWTH
Culture: NO GROWTH
Special Requests: NORMAL

## 2014-06-12 LAB — CBC
HCT: 35.1 % — ABNORMAL LOW (ref 36.0–46.0)
HEMOGLOBIN: 10.9 g/dL — AB (ref 12.0–15.0)
MCH: 25.5 pg — ABNORMAL LOW (ref 26.0–34.0)
MCHC: 31.1 g/dL (ref 30.0–36.0)
MCV: 82.2 fL (ref 78.0–100.0)
Platelets: 1178 10*3/uL (ref 150–400)
RBC: 4.27 MIL/uL (ref 3.87–5.11)
RDW: 18.1 % — AB (ref 11.5–15.5)
WBC: 14 10*3/uL — ABNORMAL HIGH (ref 4.0–10.5)

## 2014-06-12 LAB — BASIC METABOLIC PANEL
ANION GAP: 6 (ref 5–15)
BUN: 17 mg/dL (ref 6–23)
CHLORIDE: 103 mmol/L (ref 96–112)
CO2: 29 mmol/L (ref 19–32)
Calcium: 8.3 mg/dL — ABNORMAL LOW (ref 8.4–10.5)
Creatinine, Ser: 1.25 mg/dL — ABNORMAL HIGH (ref 0.50–1.10)
GFR calc Af Amer: 52 mL/min — ABNORMAL LOW (ref >90)
GFR calc non Af Amer: 44 mL/min — ABNORMAL LOW (ref >90)
Glucose, Bld: 93 mg/dL (ref 70–99)
POTASSIUM: 4 mmol/L (ref 3.5–5.1)
Sodium: 138 mmol/L (ref 135–145)

## 2014-06-12 LAB — LIPID PANEL
CHOL/HDL RATIO: 4.4 ratio
Cholesterol: 142 mg/dL (ref 0–200)
HDL: 32 mg/dL — ABNORMAL LOW (ref >39)
LDL CALC: 63 mg/dL (ref 0–99)
Triglycerides: 235 mg/dL — ABNORMAL HIGH (ref <150)
VLDL: 47 mg/dL — ABNORMAL HIGH (ref 0–40)

## 2014-06-12 MED ORDER — AMLODIPINE BESYLATE 10 MG PO TABS
10.0000 mg | ORAL_TABLET | Freq: Every day | ORAL | Status: DC
  Administered 2014-06-13 – 2014-06-14 (×2): 10 mg via ORAL
  Filled 2014-06-12 (×2): qty 1

## 2014-06-12 MED ORDER — ROSUVASTATIN CALCIUM 10 MG PO TABS
10.0000 mg | ORAL_TABLET | Freq: Every day | ORAL | Status: DC
  Administered 2014-06-12 – 2014-06-13 (×2): 10 mg via ORAL
  Filled 2014-06-12 (×3): qty 1

## 2014-06-12 MED ORDER — AMLODIPINE BESYLATE 5 MG PO TABS
5.0000 mg | ORAL_TABLET | Freq: Once | ORAL | Status: AC
  Administered 2014-06-12: 5 mg via ORAL
  Filled 2014-06-12: qty 1

## 2014-06-12 MED ORDER — CARVEDILOL 3.125 MG PO TABS
3.1250 mg | ORAL_TABLET | Freq: Two times a day (BID) | ORAL | Status: DC
  Administered 2014-06-12 – 2014-06-14 (×4): 3.125 mg via ORAL
  Filled 2014-06-12 (×6): qty 1

## 2014-06-12 NOTE — Progress Notes (Addendum)
Pt to [facility] to 5W-33, VSS, called report. Family present & aware.

## 2014-06-12 NOTE — Progress Notes (Signed)
[place] TEAM 1 - Stepdown/ICU TEAM Progress Note  Kendra Mcfarland UMP:[id number] DOB: 08-14-1950 DOA: 06/09/2014 PCP: Lottie Dawson, MD  Admit HPI / Brief Narrative: 64 y.o. femalewith a history of renal cell carcinoma status post nephrectomy and splenectomy performed at [facility] on 05/31/2014. Her symptoms started 1/23 afternoon when she started with blurry vision involving both eyes. This became progressively worse over the course of the afternoon. Towards that evening her husband noted right-sided hemi-neglect, reporting that "she favored her left side." She also developed mental status changes, confusion, disorientation, forgetting how to get around in her own home. She was recently treated with doxycycline for a urinary tract infection. CT scan of the brain without contrast performed in the emergency room noted scattered abnormal hypodensities in the periventricular white matter, corona radiata, occipital and parietal lobes with mass lesion in the right parietal lobe, consistent with intracranial metastatic disease. In the emergency room she was administered 10 mg of IV Decadron.  HPI/Subjective: Pt has noted overall improvement in her neurologic fxn.  She still notes some intermittent "fuzzy spots" in her vision, but feels it has improved overall.  She intermittently feels that she "isn't hearing some of the conversation" when her family is speaking w/ her.  She has a mild generalized HA.  She denies cp, n/v, or sob.    Assessment/Plan:  Mental status changes - focal neurologic deficits - PRES v/s metastatic renal cell CA Based upon MRI findings Neuro now suspects this represents PRES - follow Neuro recs - strive for strict BP control  HTN BP above goal - adjust tx and follow - avoid ACE/ARB due to AKI   Acute kidney injury Baseline crt 0.9 - crt at presentation 1.32 - crt is slowly improving   Thrombocytosis secondary to recent splenectomy, underlying malignancy, or  perhaps due to reactive thrombocytosis from inflammation - plts trending down  - follow trend   HLD  LDL at goal (63)  Code Status: FULL Family Communication: spoke w/ husband and at bedside at length  Disposition Plan: stable for transfer to neuro bed    Consultants: Neurology   Procedures: TTE - pending   Antibiotics: none  DVT prophylaxis: SCDs  Objective: Blood pressure 161/70, pulse 81, temperature 98.1 F (36.7 C), temperature source Oral, resp. rate 16, height 5\' 1"  (1.549 m), weight 69 kg (152 lb 1.9 oz), SpO2 95 %.  Intake/Output Summary (Last 24 hours) at 06/12/14 1307 Last data filed at 06/12/14 0036  Gross per 24 hour  Intake    320 ml  Output    700 ml  Net   -380 ml   Exam: General: No acute respiratory distress Lungs: Clear to auscultation bilaterally without wheezes or crackles Cardiovascular: Regular rate and rhythm without murmur gallop or rub normal S1 and S2 Abdomen: Nontender, nondistended, soft, bowel sounds positive, no rebound, no ascites, no appreciable mass Extremities: No significant cyanosis, clubbing, edema bilateral lower extremities  Data Reviewed: Basic Metabolic Panel:  Recent Labs Lab 06/09/14 1342 06/10/14 0643 06/11/14 0323 06/12/14 0344  NA 140 140 139 138  K 3.5 4.1 4.2 4.0  CL 100 102 103 103  CO2 33* 23 29 29   GLUCOSE 96 111* 97 93  BUN 11 14 20 17   CREATININE 1.32* 1.32* 1.41* 1.25*  CALCIUM 8.7 8.5 8.4 8.3*  MG  --   --   --  2.1    Liver Function Tests:  Recent Labs Lab 06/09/14 1342  AST 22  ALT  14  ALKPHOS 151*  BILITOT 0.7  PROT 6.6  ALBUMIN 2.9*   CBC:  Recent Labs Lab 06/09/14 1342 06/10/14 0643 06/11/14 0323 06/12/14 0344  WBC 17.5* 13.7* 15.3* 14.0*  HGB 12.0 11.0* 10.2* 10.9*  HCT 36.8 34.8* 32.7* 35.1*  MCV 82.0 83.1 82.4 82.2  PLT 1262* 1221* 1219* 1178*    Recent Results (from the past 240 hour(s))  MRSA PCR Screening     Status: None   Collection Time: 06/10/14  1:48 AM    Result Value Ref Range Status   MRSA by PCR NEGATIVE NEGATIVE Final    Comment:        The GeneXpert MRSA Assay (FDA approved for NASAL specimens only), is one component of a comprehensive MRSA colonization surveillance program. It is not intended to diagnose MRSA infection nor to guide or monitor treatment for MRSA infections.   Culture, Urine     Status: None   Collection Time: 06/10/14  9:05 PM  Result Value Ref Range Status   Specimen Description URINE, RANDOM  Final   Special Requests Normal  Final   Colony Count NO GROWTH Performed at [facility]   Final   Culture NO GROWTH Performed at [facility]   Final   Report Status 06/12/2014 FINAL  Final     Studies:  Recent x-ray studies have been reviewed in detail by the Attending Physician  Scheduled Meds:  Scheduled Meds: . amLODipine  5 mg Oral Daily  . clonazePAM  0.5 mg Oral BID  . docusate sodium  100 mg Oral BID  . lidocaine  1 patch Transdermal Q24H  . ondansetron (ZOFRAN) IV  4 mg Intravenous Once  . senna  1 tablet Oral Daily  . sodium chloride  3 mL Intravenous Q12H  . vitamin B-12  5,000 mcg Oral Daily    Time spent on care of this patient: 35 mins   MCCLUNG,JEFFREY T , MD   [facility] Office  [phone] Pager - Text Page per Shea Evans as per below:  On-Call/Text Page:      [email or web address]      password [email or web address]  If 7PM-7AM, please contact night-coverage [email or web address] Password [email or web address] 06/12/2014, 1:07 PM   LOS: 3 days

## 2014-06-12 NOTE — Progress Notes (Signed)
*  PRELIMINARY RESULTS* Echocardiogram 2D Echocardiogram has been performed.  Leavy Cella 06/12/2014, 3:33 PM

## 2014-06-12 NOTE — Progress Notes (Signed)
Subjective: Feels that she is improving.    Exam: Filed Vitals:   06/12/14 1100  BP:   Pulse:   Temp: 97 F (36.1 C)  Resp:    Gen: In bed, NAD MS: Awake, alert, interactive and appropriate CN: She is able to count fingers in the left field today, much improved. EOMI Motor: MAEW Sensory:intact to LT.    Impression: 64 yo F presenting with AMS with MRI findings most consistent with PRES. She did receive blood transfusions during her last hospital stay which have also been associated with PRES. I was not abel to find any correlation to PRES with doxycycline, but blood transfusions, recent renal worsening, hypertension can all be associated. I also wonder if infection(e.g. Uti) may also contribute. She is continuing to improve, and as long as she continues in this trend I think she could go home soon.    Recommendations: 1) continue BP control. 2) will continue to follow.   Roland Rack, MD [facility] [phone]  If 7pm- 7am, please page neurology on call as listed in [place].

## 2014-06-12 NOTE — Progress Notes (Signed)
Physical Therapy Treatment Patient Details Name: Kendra Mcfarland MRN: [id number] DOB: [birth year] Today's Date: 06/12/2014    History of Present Illness 64 y.o. presenting with AMS with MRI findings most consistent with PRES.    PT Comments    Patient demonstrates steady progress towards PT goals for mobility but continues to have cognitive deficits impairing mobility and safety. Educated patient and spouse on compensatory strategies during activity.  Will continue to see and progress as tolerated.   Follow Up Recommendations  Home health PT;Supervision/Assistance - 24 hour (pending progress, may not need HHPT )     Equipment Recommendations  Rolling walker with 5" wheels    Recommendations for Other Services       Precautions / Restrictions Precautions Precautions: Fall Restrictions Weight Bearing Restrictions: No    Mobility  Bed Mobility Overal bed mobility: Needs Assistance Bed Mobility: Supine to Sit     Supine to sit: Min assist     General bed mobility comments: VCs for positioning, assist to elevate trunk to EOB  Transfers Overall transfer level: Needs assistance Equipment used: Rolling walker (2 wheeled) Transfers: Sit to/from [facility] Sit to Stand: Min assist;Min guard Stand pivot transfers: Min assist       General transfer comment: VCs for hand placement and increased time to place hands on RW  Ambulation/Gait Ambulation/Gait assistance: Min guard;Min assist Ambulation Distance (Feet): 410 Feet Assistive device: Rolling walker (2 wheeled) Gait Pattern/deviations: Step-through pattern;Decreased stride length;Drifts right/left;Narrow base of support Gait velocity: decreased Gait velocity interpretation: Below normal speed for age/gender General Gait Details: Improved stability but continued deficits with cognition and visuospatial awareness and perception. Patient ambulating with RW and had difficulty navgating obstacles, mainly  toward right side. Cues for attention and compensatory strentegies including scanning and readjusts made throughout session.    Stairs            Wheelchair Mobility    Modified Rankin (Stroke Patients Only)       Balance     Sitting balance-Leahy Scale: Good       Standing balance-Leahy Scale: Poor Standing balance comment: continues to demonstrate instability noted, patient reaching out with poor depth perception to stabilize                    Cognition Arousal/Alertness: Awake/alert Behavior During Therapy: WFL for tasks assessed/performed Overall Cognitive Status: Impaired/Different from baseline Area of Impairment: Attention;Memory;Following commands;Safety/judgement;Problem solving   Current Attention Level: Sustained Memory: Decreased short-term memory Following Commands: Follows one step commands inconsistently (often reversing left from right) Safety/Judgement: Decreased awareness of safety   Problem Solving: Slow processing;Difficulty sequencing;Requires verbal cues General Comments: patient continues to demonstrate difficulty with sequencing and carrying out simple tasks consistently    Exercises      General Comments        Pertinent Vitals/Pain Pain Assessment: No/denies pain    Home Living                      Prior Function            PT Goals (current goals can now be found in the care plan section) Acute Rehab PT Goals Patient Stated Goal: not stated PT Goal Formulation: With patient Time For Goal Achievement: 06/25/14 Potential to Achieve Goals: Good Progress towards PT goals: Progressing toward goals    Frequency  Min 3X/week    PT Plan Current plan remains appropriate    Co-evaluation  End of Session Equipment Utilized During Treatment: Gait belt Activity Tolerance: Patient tolerated treatment well Patient left: in chair;with call bell/phone within reach;with family/visitor present      Time: 3496-1164 PT Time Calculation (min) (ACUTE ONLY): 25 min  Charges:  $Gait Training: 8-22 mins $Self Care/Home Management: 8-22                    G CodesDuncan Dull 2014/06/17, 4:36 PM Alben Deeds, [place] DPT  [phone]

## 2014-06-13 DIAGNOSIS — D75839 Thrombocytosis, unspecified: Secondary | ICD-10-CM | POA: Diagnosis present

## 2014-06-13 DIAGNOSIS — R4182 Altered mental status, unspecified: Secondary | ICD-10-CM

## 2014-06-13 DIAGNOSIS — D473 Essential (hemorrhagic) thrombocythemia: Secondary | ICD-10-CM

## 2014-06-13 DIAGNOSIS — D630 Anemia in neoplastic disease: Secondary | ICD-10-CM

## 2014-06-13 DIAGNOSIS — D72829 Elevated white blood cell count, unspecified: Secondary | ICD-10-CM

## 2014-06-13 DIAGNOSIS — H539 Unspecified visual disturbance: Secondary | ICD-10-CM

## 2014-06-13 DIAGNOSIS — R51 Headache: Secondary | ICD-10-CM

## 2014-06-13 LAB — BASIC METABOLIC PANEL
Anion gap: 7 (ref 5–15)
BUN: 12 mg/dL (ref 6–23)
CALCIUM: 8.7 mg/dL (ref 8.4–10.5)
CHLORIDE: 102 mmol/L (ref 96–112)
CO2: 30 mmol/L (ref 19–32)
CREATININE: 1.27 mg/dL — AB (ref 0.50–1.10)
GFR calc non Af Amer: 44 mL/min — ABNORMAL LOW (ref >90)
GFR, EST AFRICAN AMERICAN: 51 mL/min — AB (ref >90)
Glucose, Bld: 93 mg/dL (ref 70–99)
POTASSIUM: 4 mmol/L (ref 3.5–5.1)
Sodium: 139 mmol/L (ref 135–145)

## 2014-06-13 LAB — TSH: TSH: 4.575 u[IU]/mL — AB (ref 0.350–4.500)

## 2014-06-13 NOTE — Progress Notes (Addendum)
[facility] PROGRESS NOTE  Kendra Mcfarland YOV:[id number] DOB: Dec 08, 1950 DOA: 06/09/2014 PCP: Lottie Dawson, MD  Admit HPI / Brief Narrative: I have seen and examined Kendra Mcfarland at bedside in the presence of her husband and reviewed her chart. Also reviewed sign out note by Dr. Thereasa Solo. [place] [facility]. Kendra Mcfarland is a pleasant 64 y.o. femalewith a history of renal cell carcinoma status post nephrectomy and splenectomy performed at [facility] on 05/31/2014 who came in with blurry vision involving both eyes that started 1/23 afternoon . This became progressively worse over the course of the afternoon. Towards that evening her husband noted right-sided hemi-neglect, reporting that "she favored her left side." She also developed mental status changes, confusion, disorientation, forgetting how to get around in her own home. She was recently treated with doxycycline for a urinary tract infection. CT scan of the brain without contrast performed in the emergency room noted scattered abnormal hypodensities in the periventricular white matter, corona radiata, occipital and parietal lobes with mass lesion in the right parietal lobe, consistent with intracranial metastatic disease. In the emergency room she was administered 10 mg of IV Decadron. She eventually had brain MRI which showed "Abnormal examination with findings most suggestive of posterior reversible encephalopathy syndrome more notable on the left where there are areas of patchy enhancement and possibly petechial hemorrhage. To confirm this diagnosis, one could obtain follow-up MR in 3-4 weeks (sooner if clinically indicated) as findings should be transient. Arterial thrombosis, metastatic disease or infectious encephalopathy felt to be much less likely considerations. Question abnormal flow within left middle cerebral artery branches. On followup MR, MR angiogram recommended to help exclude proximal stenosis not appreciated on the  current exam". She has improved significantly in terms of vision but has some gait imbalance is here and there. She has thrombocytosis whose significance in terms of the current presentation is not clear-just manifestation of splenectomy/renal cell CA? Does increased viscosity have anything to do with visual impairment/edema, ?should we give medication to reduce potential complications related to the thrombocytosis. Dr. Burr Medico will graciously see patient in consult. I spoke with Dr. Leonel Ramsay. Patient likely to DC in the next 24-48 hours if she continues to do well.  Assessment/Plan:  Mental status changes - focal neurologic deficits - PRES(posterior reversible encephalopathy syndrome) v/s metastatic renal cell CA Based upon MRI findings Neuro now suspects this represents PRES - follow Neuro recs - monitor   Acute kidney injury Baseline crt 0.9 - crt at presentation 1.32 - hydrate gently and follow trend   Thrombocytosis ?secondary to recent splenectomy, underlying malignancy, or perhaps due to reactive thrombocytosis from inflammation - follow  Consult hematology  HLD   HTN BP reasonably well controlled at this time - follow w/o change in med tx presently  Code Status: FULL Family Communication: spoke w/ husband at bedside  Disposition Plan: Likely home in the next 24-48 hours   Consultants: Neurology  Hematology  Procedures: none  Antibiotics: none  DVT prophylaxis: SCDs  HPI/Subjective: Says her vision is better. Denies cough.  Objective: Filed Vitals:   06/13/14 0754  BP: 149/59  Pulse: 89  Temp:   Resp:     Intake/Output Summary (Last 24 hours) at 06/13/14 0839 Last data filed at 06/13/14 0815  Gross per 24 hour  Intake    480 ml  Output    800 ml  Net   -320 ml   Filed Weights   06/09/14 2005 06/12/14 0808 06/13/14 0325  Weight: 69 kg (  152 lb 1.9 oz) 69 kg (152 lb 1.9 oz) 64.9 kg (143 lb 1.3 oz)    Exam:   General:  Sitting up not in  distress.  Cardiovascular: RRR. S1-S2 normal. No murmurs.  Respiratory: Lungs clear.  Abdomen: Staples. No bleed or discharge. Normal bowel sounds.  Musculoskeletal: No pedal edema.   Data Reviewed: Basic Metabolic Panel:  Recent Labs Lab 06/09/14 1342 06/10/14 0643 06/11/14 0323 06/12/14 0344 06/13/14 0656  NA 140 140 139 138 139  K 3.5 4.1 4.2 4.0 4.0  CL 100 102 103 103 102  CO2 33* 23 29 29 30   GLUCOSE 96 111* 97 93 93  BUN 11 14 20 17 12   CREATININE 1.32* 1.32* 1.41* 1.25* 1.27*  CALCIUM 8.7 8.5 8.4 8.3* 8.7  MG  --   --   --  2.1  --    Liver Function Tests:  Recent Labs Lab 06/09/14 1342  AST 22  ALT 14  ALKPHOS 151*  BILITOT 0.7  PROT 6.6  ALBUMIN 2.9*   No results for input(s): LIPASE, AMYLASE in the last 168 hours. No results for input(s): AMMONIA in the last 168 hours. CBC:  Recent Labs Lab 06/09/14 1342 06/10/14 0643 06/11/14 0323 06/12/14 0344  WBC 17.5* 13.7* 15.3* 14.0*  HGB 12.0 11.0* 10.2* 10.9*  HCT 36.8 34.8* 32.7* 35.1*  MCV 82.0 83.1 82.4 82.2  PLT 1262* 1221* 1219* 1178*   Cardiac Enzymes: No results for input(s): CKTOTAL, CKMB, CKMBINDEX, TROPONINI in the last 168 hours. BNP (last 3 results) No results for input(s): PROBNP in the last 8760 hours. CBG: No results for input(s): GLUCAP in the last 168 hours.  Recent Results (from the past 240 hour(s))  MRSA PCR Screening     Status: None   Collection Time: 06/10/14  1:48 AM  Result Value Ref Range Status   MRSA by PCR NEGATIVE NEGATIVE Final    Comment:        The GeneXpert MRSA Assay (FDA approved for NASAL specimens only), is one component of a comprehensive MRSA colonization surveillance program. It is not intended to diagnose MRSA infection nor to guide or monitor treatment for MRSA infections.   Culture, Urine     Status: None   Collection Time: 06/10/14  9:05 PM  Result Value Ref Range Status   Specimen Description URINE, RANDOM  Final   Special Requests  Normal  Final   Colony Count NO GROWTH Performed at [facility]   Final   Culture NO GROWTH Performed at [facility]   Final   Report Status 06/12/2014 FINAL  Final     Studies: No results found.  Scheduled Meds: . amLODipine  10 mg Oral Daily  . carvedilol  3.125 mg Oral BID WC  . clonazePAM  0.5 mg Oral BID  . docusate sodium  100 mg Oral BID  . lidocaine  1 patch Transdermal Q24H  . rosuvastatin  10 mg Oral q1800  . senna  1 tablet Oral Daily  . sodium chloride  3 mL Intravenous Q12H  . 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    Analisa Sledd  [facility] Pager [phone]. If 7PM-7AM, please contact night-coverage at [email or web address], password [facility] 06/13/2014, 8:39 AM  LOS: 4 days

## 2014-06-13 NOTE — Progress Notes (Signed)
Occupational Therapy Treatment Patient Details Name: Kendra Mcfarland MRN: [id number] DOB: [birth year] Today's Date: 06/13/2014    History of present illness 64 y.o. presenting with AMS with MRI findings most consistent with PRES.   OT comments  Pt seen today to address ADLs, cognition, and visual-perceptual skills. Pt demonstrating difficulty with reading and continues to have Right gaze preference. Educated pt and daughter on visual scanning and visual anchoring as well as use of contrast colors for increased safety and independence with ADLs.    Follow Up Recommendations  Outpatient OT (neuro OPOT)    Equipment Recommendations  Other (comment) (RW)    Recommendations for Other Services      Precautions / Restrictions Precautions Precautions: Fall Restrictions Weight Bearing Restrictions: No       Mobility Bed Mobility Overal bed mobility: Needs Assistance Bed Mobility: Sit to Supine;Supine to Sit     Supine to sit: Supervision Sit to supine: Supervision   General bed mobility comments: Pt able to position herself sit<>supine in bed  Transfers Overall transfer level: Needs assistance Equipment used: None Transfers: Sit to/from Stand Sit to Stand: Min guard         General transfer comment: Min guard for safety.     Balance Overall balance assessment: Needs assistance Sitting-balance support: No upper extremity supported Sitting balance-Leahy Scale: Good       Standing balance-Leahy Scale: Fair                     ADL Overall ADL's : Needs assistance/impaired Eating/Feeding: Set up;Sitting Eating/Feeding Details (indicate cue type and reason): pt reports difficulty finding knife on her tray today (figure-ground discrimination) and was on pt's left side. Pt also reports that she has a difficult time distinguishing whether food is cut or whole and has family assist with cutting meat.  Grooming: Min guard;Standing Grooming Details (indicate cue type  and reason): pt reports difficulty putting toothpaste (white) on brush bristles (white) and recommended that pt could use blue/colored toothpaste for increased independence. Pt able to stand at sink and wash hands with min guard assist and demonstrated good sequencing and attention to task.                  Toilet Transfer: Minimal assistance;Ambulation (hand held assist) Toilet Transfer Details (indicate cue type and reason): pt's daughter assisted pt to bathroom with hand held assist. Discussed use of RW for increased independence and balance and family for supervision.  Toileting- Clothing Manipulation and Hygiene: Sit to/from stand;Supervision/safety       Functional mobility during ADLs: Minimal assistance (hand held assist) General ADL Comments: Pt reporting difficulty with visual-perceptual skills including figure-ground and discussed compensatory stratgies with daughter and pt. Pt continuing to have Right gaze preference and had difficulty reading a newspaper. Demonstrated use of left page anchoring to pt and daughter and encouraged pt to practice reading out-loud with anchoring technique. Pt is an avid reader.                 Cognition  Arousal/Alertness: Awake/Alert Behavior During Therapy: WFL for tasks assessed/performed Overall Cognitive Status: Impaired/Different from baseline Area of Impairment: Attention;Memory;Following commands;Safety/judgement;Problem solving   Current Attention Level: Selective Memory: Decreased short-term memory  Following Commands: Follows one step commands consistently;Follows one step commands with increased time Safety/Judgement: Decreased awareness of safety   Problem Solving: Slow processing;Requires verbal cues General Comments: pt presents with decreased attention and slight impulsivity. Pt follows directions with increased time and some  VC's. Sequencing appears to be improving                 Pertinent Vitals/ Pain       Pain  Assessment: No/denies pain Pain Score: 0-No pain         Frequency Min 2X/week     Progress Toward Goals  OT Goals(current goals can now be found in the care plan section)  Progress towards OT goals: Progressing toward goals  Acute Rehab OT Goals Patient Stated Goal: to read my newspaper OT Goal Formulation: With patient/family Time For Goal Achievement: 06/18/14 Potential to Achieve Goals: Good  Plan Discharge plan needs to be updated       End of Session     Activity Tolerance Patient tolerated treatment well   Patient Left in bed;with call bell/phone within reach;with family/visitor present   Nurse Communication          Time: 9507-2257 OT Time Calculation (min): 39 min  Charges: OT General Charges $OT Visit: 1 Procedure OT Treatments $Self Care/Home Management : 8-22 mins $Therapeutic Activity: 8-22 mins  Villa Herb M 06/13/2014, 3:24 PM  Cyndie Chime, OTR/L Occupational Therapist [phone] (pager)

## 2014-06-13 NOTE — Consult Note (Signed)
[place]  Telephone:([phone]) [place] NOTE  Kendra Mcfarland                                MR#: [id number]  DOB: Jan 15, 1951                       CSN#: [id number]  Patient Care Team: Burnis Medin, MD as PCP - General Lavonna Monarch, MD as Attending Physician (Dermatology) Sable Feil, MD as Attending Physician (Gastroenterology) Kristeen Miss, MD as Attending Physician (Neurosurgery) Curt Bears, MD as Consulting Physician (Oncology) Yevonne Pax, MD (Oncology) Referring MD: [facility]    Reason for Consult: Thrombocytopenia  Kendra Mcfarland 63 y.o. female  with recent diagnosis of renal cell carcinoma, admitted on 1/24 with acute onset of headaches and blurred vision, right side semi neglect, complicated with acute mental status changes. No fevers were reported. Her mental status changes improved since admission. CT brain was suspicious for a 1.3 cm right parietal lobe mass with vasogenic edema in the periventricular area. No hemorrhagic lesion seen. She was placed on IV Decadron. MRI brain on 1/25 showed scattered patchy enhancement medial aspect left posterior frontal lobe, parietal lobe, occipital lobe and portion of the splenium of the left aspect of the corpus callosum. There may be a minimal petechial hemorrhage in the left occipital lobe. These findings were less likely suspicious for metastatic disease. MRA was suspicious for left MCA abnormal flow. No other imaging was performed to date during this admission.  The patient has a history of thrombocytosis, leukocytosis, anemia, at least dating back to 2013. She underwent extensive hematological workup. This included a bone marrow biopsy and aspirate on 02/28/2014 specifically to evaluate the anemia. These tests did not reveal any specific findings, with the exception of hypercellularity of 70-80% with a normal ME ratio of 2.27. Fish panel showed normal karyotype. She  had excess iron stores in the marrow and had no ringed sideroblasts.   At that time, this was felt to be microcytic and of chronic disease, receiving Feraheme infusion. Her iron studies were consistent with iron deficiency and elevated ferritin level and was most likely secondary to inflammatory process of unclear etiology. Observation alone was recommended. Of note, at the time, her white count was in the range of 10.9, hemoglobin and hematocrit were 10.2 and 33.1 respectively, and her platelets were 748,000 quantitative immunoglobulins at that time were negative,with negative M spike.she also had normal LDH, and liver function tests.she did have B-12 deficiency, for which she received B-12 orally.TSH was normal. Malabsorption syndrome workup was negative. Ferritin continue to be elevated, averaging in the 1100s. Other information regarding thrombocytosis and leukocytosis is not available for review.   The patient was followed up eventually at [facility], as she sought second opinion on November 18/15. Due to serum inflammatory markers found to be elevated, she was referred to rheumatology evaluation, which led to imaging, with a CT of the abdomen on 04/10/2014. This showed a left renal mass, consistent with solid tumor. There was tumor thrombosis in the proximal left renal vein, along with few left renal hilar lymph nodes. She was diagnosed with  renal cell carcinoma status post nephrectomy and splenectomy performed at [facility] on 05/31/2014 by Dr. Frances Furbish  Her WBC following surgery was in similar range between 15-19.Her platelet count at [facility]  in January were between 537 and 691.  During this current  Her CBC showed  a WBC 17,000 and thrombocytosis, with a platelet count of 1.262,000 as of 1/24.  Of note, She denies denies intermittent headaches, frequent leg cramps or occasional chest pain. She continues to become short of breath on ambulation. She continues to experience mildly blurred  vision, but improved over the last 24 hrs.  She never suffered from diagnosis of blood clot. There is no prior diagnosis of obstructive sleep apnea. The patient denies weight loss or skin itching. Denies any ticks or other insect bites. She is a former smoker. She had been recently treated for UTI with doxycycline.   The patient is expected to be discharged in the morning, but we were asked to see her in consultation, with recommendations prior to this discharge, as she would like to resume her care at the [facility] for her hematological issues. She wishes to continue her care regarding her renal cell carcinoma at [facility]  PMH:  Past Medical History  Diagnosis Date  .  History of Anemia, B12 deficiency, Iron deficiency.  2014  . Anxiety   . Hyperlipidemia   . Hypertension   . Spinal stenosis, Lumbosacral 2/14  . Spondylosis     degenerative adult  . Bulging lumbar disc   . MONONUCLEOSIS 08/14/2007    Qualifier: Diagnosis of  Problem Stop Reason: Removed By: Hulan Saas, CMA (AAMA), [place]   . Vertigo   . Iron disorder     excessive iron storage   . Recent diagnosis of Renal Cell Carcinoma Stage 3 Seeing Dr. Lottie [facility]   Recent Enterococcus UTI      Surgeries:  Past Surgical History  Procedure Laterality Date  . Inguinal hernia repair      at birth  . Cesarean section      x 2  . Appendectomy    . Abdominal hysterectomy      bleeding fibroid tumors has ovaries  . Vein protrusion in head as a child    . Blood transfusion from vaginal bleeding    s/p left hand-assisted laparoscopic nephrectomy, retroperitoneal lymph node dissection, and splenectomy on 05/31/14   Allergies:  Allergies  Allergen Reactions  . Tramadol Nausea And Vomiting  . Hydrocodone Nausea And Vomiting  . Naproxen Sodium     REACTION: rash/blisters  Can take advil   . Penicillins Hives  . Sulfonamide Derivatives     REACTION: rash/blisters/ dypsnea    Medications:    prior to admission:   Prescriptions prior to admission  Medication Sig Dispense Refill Last Dose  . acetaminophen (TYLENOL) 325 MG tablet Take 650 mg by mouth every 6 (six) hours as needed for mild pain.    06/09/2014 at Unknown time  . cholecalciferol (VITAMIN D) 1000 UNITS tablet Take 1,000 Units by mouth daily.   Past Week at Unknown time  . clonazePAM (KLONOPIN) 0.5 MG tablet Take 0.5 mg by mouth 2 (two) times daily.   06/08/2014 at Unknown time  . Cyanocobalamin (VITAMIN B-12) 5000 MCG SUBL Place 1 tablet under the tongue daily.   Past Week at Unknown time  . docusate sodium (COLACE) 100 MG capsule Take 100 mg by mouth 2 (two) times daily.   Past Week at Unknown time  . esomeprazole (NEXIUM) 40 MG capsule Take 1 capsule (40 mg total) by mouth daily before breakfast. 30 capsule 11 Past Week at Unknown time  . lisinopril-hydrochlorothiazide (PRINZIDE,ZESTORETIC) 20-12.5 MG per tablet Take 1  tablet by mouth at bedtime.   Past Week at Unknown time  . Multiple Vitamin (MULTIVITAMIN WITH MINERALS) TABS tablet Take 1 tablet by mouth daily.   Past Week at Unknown time  . nitrofurantoin, macrocrystal-monohydrate, (MACROBID) 100 MG capsule Take 100 mg by mouth 2 (two) times daily.   06/09/2014 at Unknown time  . rosuvastatin (CRESTOR) 10 MG tablet Take 1 tablet (10 mg total) by mouth daily. 90 tablet 3 Past Week at Unknown time  . senna (SENOKOT) 8.6 MG TABS tablet Take 1 tablet by mouth daily.   Past Week at Unknown time    . amLODipine  10 mg Oral Daily  . carvedilol  3.125 mg Oral BID WC  . clonazePAM  0.5 mg Oral BID  . docusate sodium  100 mg Oral BID  . lidocaine  1 patch Transdermal Q24H  . rosuvastatin  10 mg Oral q1800  . senna  1 tablet Oral Daily  . sodium chloride  3 mL Intravenous Q12H  . vitamin B-12  5,000 mcg Oral Daily    NFA:OZHYQMVHQIONG **OR** acetaminophen, alum & mag hydroxide-simeth, HYDROmorphone (DILAUDID) injection, ondansetron **OR** ondansetron (ZOFRAN) IV,  oxyCODONE  ROS: Constitutional: Denies fevers, chills or abnormal night sweats Eyes: positive for blurriness of vision,denies double vision or watery eyes Ears, nose, mouth, throat, and face: Denies mucositis or sore throat Respiratory: Denies cough,or wheezes, she has mild dyspnea on exertion. Cardiovascular: Denies palpitation, chest discomfort or lower extremity swelling Gastrointestinal:  Denies nausea, heartburn or change in bowel habits. She has decreased appetite. She denies abdominal pain at this time. Skin: Denies abnormal skin rashes Lymphatics: Denies new lymphadenopathy or easy bruising Neurological:Denies numbness, tingling, but has mild right sided neglect. Behavioral/Psych: Mood is And sugars. The patient is tearful.,   All other systems were reviewed with the patient and are negative.    Family History:    Family History  Problem Relation Age of Onset  . Hypertension Mother   . Arthritis Mother   . Hyperlipidemia Mother   . Memory loss Mother 16    short term  . Other Mother 4    angioplasty  . Arthritis Father   . Hypertension Father   . Heart attack Father   . Hyperthyroidism Sister   . Anemia Sister   . Hypothyroidism Sister   . Hyperlipidemia Sister   . Colon cancer Neg Hx   . Esophageal cancer Neg Hx   . Rectal cancer Neg Hx   . Stomach cancer Neg Hx     No family history of hematological  disorders.  Social History:  reports that she quit smoking about 20 years ago. She has never used smokeless tobacco. She reports that she drinks alcohol. She reports that she does not use illicit drugs. She lives in [place] with her husband, has 2 children.  Physical Exam   Filed Vitals:   06/13/14 0943  BP: 127/53  Pulse:   Temp:   Resp:    Filed Weights   06/09/14 2005 06/12/14 0808 06/13/14 0325  Weight: 152 lb 1.9 oz (69 kg) 152 lb 1.9 oz (69 kg) 143 lb 1.3 oz (64.9 kg)    GENERAL:alert, no distress and comfortable SKIN: skin color, texture,  turgor are normal, no rashes or significant lesions EYES: normal, conjunctiva are pink and non-injected, sclera clear OROPHARYNX:no exudate, no erythema and lips, buccal mucosa, and tongue normal  NECK: supple, thyroid normal size, non-tender, without nodularity LYMPH:  no palpable lymphadenopathy in the cervical, axillary or inguinal  area LUNGS: clear to auscultation and percussion with normal breathing effort HEART: regular rate & rhythm and no murmurs and no lower extremity edema ABDOMEN:abdomen soft, non-tender and normal bowel sounds Musculoskeletal:no cyanosis of digits and no clubbing  PSYCH: alert & oriented x 3 with fluent speech NEURO: mild right-sided neglect, bilateral blurriness in vision. She reports that her confusion has improved, but not resolved  Labs:    Recent Labs Lab 06/09/14 1342 06/10/14 0643 06/11/14 0323 06/12/14 0344  WBC 17.5* 13.7* 15.3* 14.0*  HGB 12.0 11.0* 10.2* 10.9*  HCT 36.8 34.8* 32.7* 35.1*  PLT 1262* 1221* 1219* 1178*  MCV 82.0 83.1 82.4 82.2  MCH 26.7 26.3 25.7* 25.5*  MCHC 32.6 31.6 31.2 31.1  RDW 17.5* 17.7* 17.9* 18.1*        Recent Labs Lab 06/09/14 1342 06/10/14 0643 06/11/14 0323 06/12/14 0344 06/13/14 0656  NA 140 140 139 138 139  K 3.5 4.1 4.2 4.0 4.0  CL 100 102 103 103 102  CO2 33* $Remov'23 29 29 30  'KvahyP$ GLUCOSE 96 111* 97 93 93  BUN $Re'11 14 20 17 12  'AHx$ CREATININE 1.32* 1.32* 1.41* 1.25* 1.27*  CALCIUM 8.7 8.5 8.4 8.3* 8.7  MG  --   --   --  2.1  --   AST 22  --   --   --   --   ALT 14  --   --   --   --   ALKPHOS 151*  --   --   --   --   BILITOT 0.7  --   --   --   --         Component Value Date/Time   BILITOT 0.7 06/09/2014 1342   BILITOT 0.54 11/14/2013 1040   BILIDIR 0.0 08/23/2013 0858   ESR 134, CRP 8.9 mg/dl (04/05/14)    Anemia panel:  No results for input(s): VITAMINB12, FOLATE, FERRITIN, TIBC, IRON, RETICCTPCT in the last 72 hours.  Urinalysis    Component Value Date/Time   COLORURINE YELLOW  06/10/2014 2105   APPEARANCEUR CLOUDY* 06/10/2014 2105   LABSPEC 1.021 06/10/2014 2105   PHURINE 6.0 06/10/2014 2105   GLUCOSEU NEGATIVE 06/10/2014 2105   GLUCOSEU NEGATIVE 07/13/2011 1001   HGBUR TRACE* 06/10/2014 2105   HGBUR trace-lysed 09/09/2009 0825   BILIRUBINUR SMALL* 06/10/2014 2105   BILIRUBINUR negative 07/13/2013 1132   BILIRUBINUR negative 07/13/2013 1132   BILIRUBINUR n 06/16/2012 1117   KETONESUR 15* 06/10/2014 2105   PROTEINUR 30* 06/10/2014 2105   PROTEINUR n 06/16/2012 1117   UROBILINOGEN 1.0 06/10/2014 2105   UROBILINOGEN 4.0 07/13/2013 1132   NITRITE NEGATIVE 06/10/2014 2105   NITRITE Negative 07/13/2013 1132   NITRITE n 06/16/2012 1117   LEUKOCYTESUR NEGATIVE 06/10/2014 2105    Drugs of Abuse  No results found for: LABOPIA, COCAINSCRNUR, LABBENZ, AMPHETMU, THCU, LABBARB   Imaging Studies:  Ct Head Wo Contrast  06/09/2014   CLINICAL DATA:  Nausea and vomiting. Slurred speech. Confusion. Urinary tract infection. Nystagmus. Prior left nephrectomy and splenectomy 9 days ago at [facility] for renal cell carcinoma.  EXAM: CT HEAD WITHOUT CONTRAST  TECHNIQUE: Contiguous axial images were obtained from the base of the skull through the vertex without intravenous contrast.  COMPARISON:  None  FINDINGS: The cerebellum and brainstem appear unremarkable as do the thalami, basal ganglia, and ventricular system.  There is abnormal vasogenic edema posteriorly in the periventricular white matter and corona radiata. Possible 1.3 cm mass in the right parietal lobe,  image 19 series 2. No overt intracranial hyperdensity to favor intracranial hemorrhage. No destructive calvarial lesions identified.  IMPRESSION: 1. Considerably abnormal intracranial appearance with scattered abnormal white matter hypodensities in the periventricular white matter, corona radiata, and occipital and parietal lobes, with a possible mass lesion in the right parietal lobe. 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 Bilateral encephalitis or white matter infarcts not excluded. MRI brain with without contrast recommended (reduced dose of contrast may be warranted due to renal insufficiency).   Electronically Signed   By: Sherryl Barters M.D.   On: 06/09/2014 16:54   Mr Jodene Nam Head Wo Contrast  06/10/2014   CLINICAL DATA:  Posterior reversible encephalopathy syndrome. Confusion and headache. Blurred vision. Hypertension.  EXAM: MRA HEAD WITHOUT CONTRAST  TECHNIQUE: Angiographic images of the Circle of Willis were obtained using MRA technique without intravenous contrast.  COMPARISON:  Head MRI 06/10/2014  FINDINGS: Images are mildly to moderate degraded by motion artifact.  Visualized distal vertebral arteries are patent with the right being mildly dominant. Left vertebral artery is small distal to the PICA origin, possibly with superimposed mild stenosis. Left PICA origin is patent. Right PICA is not clearly identified. SCA origins are patent. Basilar artery is patent without stenosis. PCAs are patent without proximal stenosis. Mild PCA branch vessel irregularity may be artifactual due to motion or reflect underlying atherosclerosis. Posterior communicating arteries are not clearly identified.  Internal carotid arteries are patent from skullbase to carotid termini without evidence of significant stenosis allowing for motion artifact. M1 segments are patent without stenosis. There is mild left greater than right MCA branch vessel irregularity. Right A1 segment is hypoplastic. Left A1 segment is dominant without stenosis and provides the dominant supply to the right A2 segment via a patent anterior communicating artery. No intracranial aneurysm is identified.  IMPRESSION: Motion degraded examination. No evidence of major intracranial arterial occlusion or significant proximal stenosis.   Electronically Signed   By: Logan Bores   On: 06/10/2014 12:56   Mr Jeri Cos Wo Contrast  06/10/2014   CLINICAL DATA:  64 year old  female with history of high blood pressure and hyperlipidemia status post nephrectomy and splenectomy 05/31/2014 for renal cell carcinoma. Yesterday developed visual difficulty, blurred vision and headache becoming progressively worse leading to confused and disoriented state. No fever. Mild left arm weakness, left neglect and left gaze preference upon neurological exam. Abnormal CT. Subsequent encounter.  EXAM: MRI HEAD WITHOUT AND WITH CONTRAST  TECHNIQUE: Multiplanar, multiecho pulse sequences of the brain and surrounding structures were obtained without and with intravenous contrast.  CONTRAST:  74mL MULTIHANCE GADOBENATE DIMEGLUMINE 529 MG/ML IV SOLN  COMPARISON:  06/09/2014 head CT.  No comparison brain MR.  FINDINGS: Abnormal appearance of the occipital lobes, portions of the parietal lobes and medial aspect the posterior frontal lobes. Findings more notable involving the left occipital lobe. Scattered patchy enhancement medial aspect left posterior frontal lobe, parietal lobe, occipital lobe and portion of the splenium of the left aspect of the corpus callosum. There may be a minimal petechial hemorrhage in the left occipital lobe. Despite the fact that there may be small areas of restricted motion, the symmetric appearance and distribution is most suggestive of posterior reversible encephalopathy syndrome. No evidence of large dural sinus thrombosis to suggest changes are related to venous thrombosis. Arterial distribution infarcts felt to be a much less likely consideration.  No abnormal enhancing lesion in the right parietal lobe as questioned on CT. Metastatic disease as cause for the  above described findings is felt to be a much less likely possibility.  As the above described findings should be transient, one can confirm clearance on follow-up MR imaging in 3-4 weeks.  On FLAIR imaging increased signal is noted within the left middle cerebral artery branches. This may indicate proximal stenosis. There  is a flow void indicating patency of the major intracranial vascular structures however, when the patient has follow-up MR, MR angiography can be performed to exclude M1 stenosis which may not be appreciated on the present exam.  No hydrocephalus.  Cervical medullary junction, pituitary region, pineal region and orbital structures unremarkable.  IMPRESSION: Abnormal examination with findings most suggestive of posterior reversible encephalopathy syndrome more notable on the left where there are areas of patchy enhancement and possibly petechial hemorrhage. To confirm this diagnosis, one could obtain follow-up MR in 3-4 weeks (sooner if clinically indicated) as findings should be transient. Arterial thrombosis, metastatic disease or infectious encephalopathy felt to be much less likely considerations.  Question abnormal flow within left middle cerebral artery branches. On followup MR, MR angiogram recommended to help exclude proximal stenosis not appreciated on the current exam.   Electronically Signed   By: Chauncey Cruel M.D.   On: 06/10/2014 07:44   FINAL DIAGNOSIS Diagnosis Bone Marrow, Aspirate,Biopsy, and Clot, right iliac - HYPERCELLULAR BONE MARROW WITH TRILINEAGE HEMATOPOIESIS. - SEE COMMENT. PERIPHERAL BLOOD: - NORMOCYTIC-NORMOCHROMIC ANEMIA. - MILD LEUKOCYTOSIS. - THROMBOCYTOSIS. Diagnosis Note The bone marrow is hypercellular for age with trilineage hematopoiesis and nonspecific changes. Significant dyspoiesis or diagnostic morphologic features of a myeloproliferative neoplasm are not appreciated. The plasma cells represent 6% of all cells with polyclonal staining pattern for kappa and lambda light chains. Correlation with cytogenetic studies is recommended. (BNS:gt, 2014/03/22) Susanne Greenhouse MD Pathologist, Electronic Signature (Case signed 03/04/2014)  A/P: 64 y.o. female with :  Thrombocytosis This is known, at least dating back to 2013. She had extensive workup, including a bone  marrow biopsy on 02/28/2014 which was essentially unrevealing. Patient's platelet count on admission was  1,262,000, not trending down at 1,178,000 This is likely reactive, in the setting of recent splenectomy, anemia and malignancy. Due to risk of hemorrhage and thrombotic event, will determine necessity for antiplatelet agents. Dr. Burr Medico to see patient later today.  Will check peripheral smear.  The patient is to be discharged tomorrow. Plans to see her as an outpatient at the cancer center will be arranged. Further testing can be performed at our clinic  Leukocytosis This is known, at least dating back to 2013. She had extensive workup, including a bone marrow biopsy on 02/28/2014 which was essentially unrevealing. Patient received Decadron IV at the emergency Department which may have exacerbated her white count. This is normalizing with IVF, and discontinuation of Decadron.  Continue to monitor. Will check peripheral smear  Anemia in neoplastic disease Patient has a history of chronic anemia, iron deficiency  and B12 deficiency, recent post op blood loss.  These dates back to 2013, and extensive studies were done, including bone marrow biopsy on 02/28/2014. The patient received Feraheme twice since. She also had transfusions, platelet is 2 units in January 2016 after her surgery for removal of tumor. She has a history of iron overload likely due to increased Iron supplementation/ transfusions No bleeding issues reported during this admission No transfusion is indicated at this time Will check smear. 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 Based on MRI findings, PRES highly suspected This is resolving  Other medical issues as per admitting team.      Thank you for the referral.  **Disclaimer: This note was dictated with voice recognition software. Similar sounding words can inadvertently be transcribed and this note may contain transcription errors which may not have been corrected upon publication of note.** WERTMAN,SARA E, PA-C 06/13/2014 11:22 AM  Addendum: I have seen the patient, examined her. I agree with the assessment and and plan and have edited the notes.   Her thrombocytosis is likely reactive, secondary to recent splenectomy, she had bone marrow biopsy in 2015 which was negative for myeloma proliferative neoplasm.   The risk of bleeding and thrombosis is low in reactive thrombocytosis. She has no significant bleeding or history of thrombosis, I do not recommend  platelet-lowering agent at this point. Giving the minimal petechial hemorrhage in the left occipital lobe, I will hold on aspirin also.   She knows to call my office if she wants to follow-up with [place] after discharge. She will follow-up with her primary care physician for her thrombocytosis.Marland Kitchen   She will follow-up [facility] for her RCC.   Truitt Merle  06/13/2014

## 2014-06-13 NOTE — Progress Notes (Signed)
Subjective: Notes odd visual phenomena(seeing people lokoing like Tax inspector, etc.)  Exam: Filed Vitals:   06/13/14 0754  BP: 149/59  Pulse: 89  Temp:   Resp:    Gen: In bed, NAD MS: Awake, alert, interactive and appropriate CN: She is able to count fingers in the left field today, much improved. EOMI Motor: MAEW Sensory:intact to LT.    Impression: 64 yo F presenting with AMS with MRI findings most consistent with PRES. She did receive blood transfusions during her last hospital stay which have also been associated with PRES. I was not abel to find any correlation to PRES with doxycycline, but blood transfusions, recent renal worsening, hypertension can all be associated. I also wonder if infection(e.g. Uti) may also contribute. She is continuing to improve, and as long as she continues in this trend I think she could go home soon. I think that she is noticing the visual phenomena now because she is improving, not because they are signs of new pathology.     Recommendations: 1) continue BP control. 2) will continue to follow  Roland Rack, MD [facility] [phone]  If 7pm- 7am, please page neurology on call as listed in [place].

## 2014-06-13 NOTE — Progress Notes (Signed)
Physical Therapy Treatment Patient Details Name: Kendra Mcfarland MRN: [id number] DOB: 1950/08/27 Today's Date: 06/13/2014    History of Present Illness 64 y.o. presenting with AMS with MRI findings most consistent with PRES.    PT Comments    Pt progressing well.  Was able to ambulate with balance challenges with RW, but with closer guard and assist where needed.  Pt reports some visual input is still somewhat skewed and does affect her overall balance.  Follow Up Recommendations  Home health PT;Supervision/Assistance - 24 hour     Equipment Recommendations  Rolling walker with 5" wheels    Recommendations for Other Services       Precautions / Restrictions Precautions Precautions: Fall Restrictions Weight Bearing Restrictions: No    Mobility  Bed Mobility Overal bed mobility: Needs Assistance Bed Mobility: Supine to Sit     Supine to sit: Min assist        Transfers Overall transfer level: Needs assistance Equipment used: None Transfers: Sit to/from Stand Sit to Stand: Min assist         General transfer comment: VCs for hand placement and increased time to place hands on RW  Ambulation/Gait Ambulation/Gait assistance: Min assist;Min guard Ambulation Distance (Feet): 500 Feet Assistive device: None;1 person hand held assist Gait Pattern/deviations: Step-through pattern Gait velocity: decreased with little ability to increase cadence   General Gait Details: mild instability at times due to dizziness and squewed visual infput, but no overt LOB   Stairs Stairs: Yes Stairs assistance: Min assist (or less) Stair Management: One rail Right;Alternating pattern;Forwards Number of Stairs: 4 General stair comments: safe with rail and close by guarding  Wheelchair Mobility    Modified Rankin (Stroke Patients Only)       Balance Overall balance assessment: Needs assistance Sitting-balance support: No upper extremity supported Sitting balance-Leahy  Scale: Good       Standing balance-Leahy Scale: Fair                      Cognition Arousal/Alertness: Awake/alert Behavior During Therapy: WFL for tasks assessed/performed Overall Cognitive Status: Within Functional Limits for tasks assessed     Current Attention Level: Selective   Following Commands: Follows one step commands consistently;Follows one step commands with increased time     Problem Solving: Slow processing      Exercises      General Comments General comments (skin integrity, edema, etc.): Discuss with pt and family that it was time to cut pt's assist down a bit-- pt to ask for less assist and family not enabling quite so much.      Pertinent Vitals/Pain Pain Assessment: No/denies pain Pain Score: 0-No pain    Home Living                      Prior Function            PT Goals (current goals can now be found in the care plan section) Acute Rehab PT Goals PT Goal Formulation: With patient Time For Goal Achievement: 06/25/14 Potential to Achieve Goals: Good Progress towards PT goals: Progressing toward goals    Frequency  Min 3X/week    PT Plan Current plan remains appropriate    Co-evaluation             End of Session   Activity Tolerance: Patient tolerated treatment well Patient left: in chair;with call bell/phone within reach;with family/visitor present     Time: 1051-1120 PT  Time Calculation (min) (ACUTE ONLY): 29 min  Charges:  $Gait Training: 8-22 mins $Therapeutic Activity: 8-22 mins                    G Codes:      Ruther Ephraim, Tessie Fass 06/13/2014, 11:38 AM  06/13/2014  Donnella Sham, PT [phone]  (pager)

## 2014-06-14 ENCOUNTER — Encounter: Payer: Self-pay | Admitting: Internal Medicine

## 2014-06-14 DIAGNOSIS — R7989 Other specified abnormal findings of blood chemistry: Secondary | ICD-10-CM | POA: Diagnosis present

## 2014-06-14 LAB — COMPREHENSIVE METABOLIC PANEL
ALBUMIN: 2.9 g/dL — AB (ref 3.5–5.2)
ALT: 13 U/L (ref 0–35)
AST: 23 U/L (ref 0–37)
Alkaline Phosphatase: 103 U/L (ref 39–117)
Anion gap: 6 (ref 5–15)
BILIRUBIN TOTAL: 0.4 mg/dL (ref 0.3–1.2)
BUN: 15 mg/dL (ref 6–23)
CALCIUM: 8.8 mg/dL (ref 8.4–10.5)
CO2: 30 mmol/L (ref 19–32)
CREATININE: 1.29 mg/dL — AB (ref 0.50–1.10)
Chloride: 102 mmol/L (ref 96–112)
GFR calc Af Amer: 50 mL/min — ABNORMAL LOW (ref >90)
GFR, EST NON AFRICAN AMERICAN: 43 mL/min — AB (ref >90)
Glucose, Bld: 83 mg/dL (ref 70–99)
Potassium: 3.9 mmol/L (ref 3.5–5.1)
Sodium: 138 mmol/L (ref 135–145)
TOTAL PROTEIN: 6.4 g/dL (ref 6.0–8.3)

## 2014-06-14 LAB — CBC WITH DIFFERENTIAL/PLATELET
Basophils Absolute: 0 10*3/uL (ref 0.0–0.1)
Basophils Relative: 0 % (ref 0–1)
EOS PCT: 6 % — AB (ref 0–5)
Eosinophils Absolute: 0.8 10*3/uL — ABNORMAL HIGH (ref 0.0–0.7)
HCT: 38.3 % (ref 36.0–46.0)
HEMOGLOBIN: 11.9 g/dL — AB (ref 12.0–15.0)
LYMPHS PCT: 25 % (ref 12–46)
Lymphs Abs: 3.2 10*3/uL (ref 0.7–4.0)
MCH: 26.2 pg (ref 26.0–34.0)
MCHC: 31.1 g/dL (ref 30.0–36.0)
MCV: 84.4 fL (ref 78.0–100.0)
MONOS PCT: 11 % (ref 3–12)
Monocytes Absolute: 1.4 10*3/uL — ABNORMAL HIGH (ref 0.1–1.0)
NEUTROS ABS: 7.2 10*3/uL (ref 1.7–7.7)
Neutrophils Relative %: 58 % (ref 43–77)
Platelets: 1292 10*3/uL (ref 150–400)
RBC: 4.54 MIL/uL (ref 3.87–5.11)
RDW: 18.5 % — ABNORMAL HIGH (ref 11.5–15.5)
WBC: 12.6 10*3/uL — ABNORMAL HIGH (ref 4.0–10.5)

## 2014-06-14 MED ORDER — CARVEDILOL 3.125 MG PO TABS: 3.1250 mg | ORAL_TABLET | Freq: Two times a day (BID) | ORAL | Status: DC

## 2014-06-14 MED ORDER — AMLODIPINE BESYLATE 10 MG PO TABS: 10.0000 mg | ORAL_TABLET | Freq: Every day | ORAL | Status: DC

## 2014-06-14 MED ORDER — CLONAZEPAM 0.5 MG PO TABS: 0.5000 mg | ORAL_TABLET | Freq: Two times a day (BID) | ORAL | Status: DC

## 2014-06-14 NOTE — Progress Notes (Signed)
Patient discharge teaching given, including activity, diet, follow-up appoints, and medications. Patient verbalized understanding of all discharge instructions. IV access was d/c'd. Vitals are stable. Skin is intact except as charted in most recent assessments. Pt to be escorted out by NT, to be driven home by family. 

## 2014-06-14 NOTE — Care Management Note (Signed)
    Page 1 of 2   06/14/2014     11:43:47 AM CARE MANAGEMENT NOTE 06/14/2014  Patient:  Kendra Mcfarland, Kendra Mcfarland   Account Number:  [id number]  Date Initiated:  06/14/2014  Documentation initiated by:  Tomi Bamberger  Subjective/Objective Assessment:   dx abnormal ct brain  admit- lives with spouse.     Action/Plan:   pt eval -hhpt   Anticipated DC Date:  06/14/2014   Anticipated DC Plan:  [place]  CM consult      [facility] Choice  HOME HEALTH   Choice offered to / List presented to:  C-1 Patient   DME arranged  Vassie Moselle      DME agency  [place] arranged  [place].   Status of service:  Completed, signed off Medicare Important Message given?  NO (If response is "NO", the following Medicare IM given date fields will be blank) Date Medicare IM given:   Medicare IM given by:   Date Additional Medicare IM given:   Additional Medicare IM given by:    Discharge Disposition:  [place]  Per UR Regulation:  Reviewed for med. necessity/level of care/duration of stay  If discussed at [place] of Stay Meetings, dates discussed:    Comments:  06/14/14 [place], BSN [phone] patient leaving down hall, NCM caught patient and offered choice for hhpt, she chose [facility] and they state they would like rolling walker delivered to home.  NCM made referral to [facility] , [place] notified for hhpt, and Jermaine notified for rolling walker, he states he will call patient to let them know they can pick walker up from [facility] store or it will be mailed in the mail.

## 2014-06-14 NOTE — Discharge Summary (Addendum)
Kendra Mcfarland, is a 64 y.o. female  DOB 1950/09/30  MRN [id number].  Admission date:  06/09/2014  Admitting Physician  Kelvin Cellar, MD  Discharge Date:  06/14/2014   Primary MD  Lottie Dawson, MD  Recommendations for primary care physician for things to follow:   Follow platelet count/consider repeating MRI brain in 2-3 weeks to ensure resolution of PRES.   Admission Diagnosis  Abnormal CT of brain   Discharge Diagnosis  Abnormal CT of brain    Active Problems:   Essential hypertension   Renal cell carcinoma   Abnormal CT of brain   Hemineglect   Metastatic renal cell carcinoma   PRES (posterior reversible encephalopathy syndrome)   HLD (hyperlipidemia)   Thrombocytosis   Elevated TSH      Past Medical History  Diagnosis Date  . Anemia     years ago  . Transfusion history     before her hysterectomy  . Anxiety   . Hyperlipidemia   . Hypertension   . Spinal stenosis   . Spondylosis     degenerative adult  . Bulging lumbar disc   . MONONUCLEOSIS 08/14/2007    Qualifier: Diagnosis of  Problem Stop Reason: Removed By: Hulan Saas, CMA (AAMA), [place]   . Vertigo   . Iron disorder     excessive iron storage   . Cancer     Past Surgical History  Procedure Laterality Date  . Inguinal hernia repair      at birth  . Cesarean section      x 2  . Appendectomy    . Abdominal hysterectomy      bleeding fibroid tumors has ovaries  . Vein protrusion in head as a child    . Blood transfusion from vaginal bleeding         History of present illness and  Hospital Course:     Kindly see H&P for history of present illness and admission details, please review complete Labs, Consult reports and Test reports for all details in brief  HPI  from the history and physical done on the day of  admission    Kendra Mcfarland is a pleasant 64 y.o. femalewith a history of renal cell carcinoma status post nephrectomy and splenectomy performed at [facility] on 05/31/2014 who came in with blurry vision involving both eyes that started 1/23 afternoon . This became progressively worse over the course of the afternoon. Towards that evening her husband noted right-sided hemi-neglect, reporting that "she favored her left side." She also developed mental status changes, confusion, disorientation, forgetting how to get around in her own home. She was recently treated with doxycycline for a urinary tract infection. CT scan of the brain without contrast performed in the emergency room noted scattered abnormal hypodensities in the periventricular white matter, corona radiata, occipital and parietal lobes with mass lesion in the right parietal lobe, consistent with intracranial metastatic disease. In the emergency room she was administered 10 mg of IV Decadron. She eventually  had brain MRI which showed "Abnormal examination with findings most suggestive of posterior reversible encephalopathy syndrome more notable on the left where there are areas of patchy enhancement and possibly petechial hemorrhage. To confirm this diagnosis, one could obtain follow-up MR in 3-4 weeks (sooner if clinically indicated) as findings should be transient. Arterial thrombosis, metastatic disease or infectious encephalopathy felt to be much less likely considerations. Question abnormal flow within left middle cerebral artery branches. On followup MR, MR angiogram recommended to help exclude proximal stenosis not appreciated on the current exam". She has improved significantly in terms of vision but has some gait imbalance  here and there. She has thrombocytosis felt to be reactionary status post splenectomy. Patient was seen by hematology who didn't think patient needed antiplatelet agents especially given increased risk  for bleeding. Patient has promised not to drive until she follows with her primary care providers and her vision is back to normal. She should follow with her primary care providers at [facility] in the next 2-3 weeks. Of note is that patient's blood pressure medications were changed to amlodipine/Coreg and ACE inhibitor/diuretic was discontinued in view of renal insufficiency. Patient was also noted to have slightly elevated TSH which will need monitoring by her primary care team.  Discharge Condition: Relatively stable   Follow UP Oncology/neurology/general surgery     Discharge Instructions  and  Discharge Medications        Discharge Instructions    Diet - low sodium heart healthy    Complete by:  As directed      Increase activity slowly    Complete by:  As directed             Medication List    STOP taking these medications        lisinopril-hydrochlorothiazide 20-12.5 MG per tablet  Commonly known as:  PRINZIDE,ZESTORETIC     nitrofurantoin (macrocrystal-monohydrate) 100 MG capsule  Commonly known as:  MACROBID      TAKE these medications        acetaminophen 325 MG tablet  Commonly known as:  TYLENOL  Take 650 mg by mouth every 6 (six) hours as needed for mild pain.     amLODipine 10 MG tablet  Commonly known as:  NORVASC  Take 1 tablet (10 mg total) by mouth daily.     carvedilol 3.125 MG tablet  Commonly known as:  COREG  Take 1 tablet (3.125 mg total) by mouth 2 (two) times daily with a meal.     cholecalciferol 1000 UNITS tablet  Commonly known as:  VITAMIN D  Take 1,000 Units by mouth daily.     clonazePAM 0.5 MG tablet  Commonly known as:  KLONOPIN  Take 1 tablet (0.5 mg total) by mouth 2 (two) times daily.     docusate sodium 100 MG capsule  Commonly known as:  COLACE  Take 100 mg by mouth 2 (two) times daily.     esomeprazole 40 MG capsule  Commonly known as:  NEXIUM  Take 1 capsule (40 mg total) by mouth daily before breakfast.      multivitamin with minerals Tabs tablet  Take 1 tablet by mouth daily.     rosuvastatin 10 MG tablet  Commonly known as:  CRESTOR  Take 1 tablet (10 mg total) by mouth daily.     senna 8.6 MG Tabs tablet  Commonly known as:  SENOKOT  Take 1 tablet by mouth daily.     Vitamin B-12 5000 MCG Subl  Place 1 tablet under the tongue daily.          Diet and Activity recommendation: See Discharge Instructions above   Consults obtained - neurology/hematology   Major procedures and Radiology Reports - PLEASE review detailed and final reports for all details, in brief -    Ct Head Wo Contrast  06/09/2014   CLINICAL DATA:  Nausea and vomiting. Slurred speech. Confusion. Urinary tract infection. Nystagmus. Prior left nephrectomy and splenectomy 9 days ago at [facility] for renal cell carcinoma.  EXAM: CT HEAD WITHOUT CONTRAST  TECHNIQUE: Contiguous axial images were obtained from the base of the skull through the vertex without intravenous contrast.  COMPARISON:  None  FINDINGS: The cerebellum and brainstem appear unremarkable as do the thalami, basal ganglia, and ventricular system.  There is abnormal vasogenic edema posteriorly in the periventricular white matter and corona radiata. Possible 1.3 cm mass in the right parietal lobe, image 19 series 2. No overt intracranial hyperdensity to favor intracranial hemorrhage. No destructive calvarial lesions identified.  IMPRESSION: 1. Considerably abnormal intracranial appearance with scattered abnormal white matter hypodensities in the periventricular white matter, corona radiata, and occipital and parietal lobes, with a possible mass lesion in the right parietal lobe. 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 Bilateral encephalitis or white matter infarcts not excluded. MRI brain with without contrast recommended (reduced dose of contrast may be warranted due to renal insufficiency).   Electronically Signed   By: Sherryl Barters M.D.   On: 06/09/2014  16:54   Mr Jodene Nam Head Wo Contrast  06/10/2014   CLINICAL DATA:  Posterior reversible encephalopathy syndrome. Confusion and headache. Blurred vision. Hypertension.  EXAM: MRA HEAD WITHOUT CONTRAST  TECHNIQUE: Angiographic images of the Circle of Willis were obtained using MRA technique without intravenous contrast.  COMPARISON:  Head MRI 06/10/2014  FINDINGS: Images are mildly to moderate degraded by motion artifact.  Visualized distal vertebral arteries are patent with the right being mildly dominant. Left vertebral artery is small distal to the PICA origin, possibly with superimposed mild stenosis. Left PICA origin is patent. Right PICA is not clearly identified. SCA origins are patent. Basilar artery is patent without stenosis. PCAs are patent without proximal stenosis. Mild PCA branch vessel irregularity may be artifactual due to motion or reflect underlying atherosclerosis. Posterior communicating arteries are not clearly identified.  Internal carotid arteries are patent from skullbase to carotid termini without evidence of significant stenosis allowing for motion artifact. M1 segments are patent without stenosis. There is mild left greater than right MCA branch vessel irregularity. Right A1 segment is hypoplastic. Left A1 segment is dominant without stenosis and provides the dominant supply to the right A2 segment via a patent anterior communicating artery. No intracranial aneurysm is identified.  IMPRESSION: Motion degraded examination. No evidence of major intracranial arterial occlusion or significant proximal stenosis.   Electronically Signed   By: Logan Bores   On: 06/10/2014 12:56   Mr Jeri Cos Wo Contrast  06/10/2014   CLINICAL DATA:  64 year old female with history of high blood pressure and hyperlipidemia status post nephrectomy and splenectomy 05/31/2014 for renal cell carcinoma. Yesterday developed visual difficulty, blurred vision and headache becoming progressively worse leading to confused  and disoriented state. No fever. Mild left arm weakness, left neglect and left gaze preference upon neurological exam. Abnormal CT. Subsequent encounter.  EXAM: MRI HEAD WITHOUT AND WITH CONTRAST  TECHNIQUE: Multiplanar, multiecho pulse sequences of the brain and surrounding structures were obtained without and with intravenous contrast.  CONTRAST:  42mL MULTIHANCE GADOBENATE DIMEGLUMINE 529 MG/ML IV SOLN  COMPARISON:  06/09/2014 head CT.  No comparison brain MR.  FINDINGS: Abnormal appearance of the occipital lobes, portions of the parietal lobes and medial aspect the posterior frontal lobes. Findings more notable involving the left occipital lobe. Scattered patchy enhancement medial aspect left posterior frontal lobe, parietal lobe, occipital lobe and portion of the splenium of the left aspect of the corpus callosum. There may be a minimal petechial hemorrhage in the left occipital lobe. Despite the fact that there may be small areas of restricted motion, the symmetric appearance and distribution is most suggestive of posterior reversible encephalopathy syndrome. No evidence of large dural sinus thrombosis to suggest changes are related to venous thrombosis. Arterial distribution infarcts felt to be a much less likely consideration.  No abnormal enhancing lesion in the right parietal lobe as questioned on CT. Metastatic disease as cause for the above described findings is felt to be a much less likely possibility.  As the above described findings should be transient, one can confirm clearance on follow-up MR imaging in 3-4 weeks.  On FLAIR imaging increased signal is noted within the left middle cerebral artery branches. This may indicate proximal stenosis. There is a flow void indicating patency of the major intracranial vascular structures however, when the patient has follow-up MR, MR angiography can be performed to exclude M1 stenosis which may not be appreciated on the present exam.  No hydrocephalus.   Cervical medullary junction, pituitary region, pineal region and orbital structures unremarkable.  IMPRESSION: Abnormal examination with findings most suggestive of posterior reversible encephalopathy syndrome more notable on the left where there are areas of patchy enhancement and possibly petechial hemorrhage. To confirm this diagnosis, one could obtain follow-up MR in 3-4 weeks (sooner if clinically indicated) as findings should be transient. Arterial thrombosis, metastatic disease or infectious encephalopathy felt to be much less likely considerations.  Question abnormal flow within left middle cerebral artery branches. On followup MR, MR angiogram recommended to help exclude proximal stenosis not appreciated on the current exam.   Electronically Signed   By: Chauncey Cruel M.D.   On: 06/10/2014 07:44    Micro Results    Recent Results (from the past 240 hour(s))  MRSA PCR Screening     Status: None   Collection Time: 06/10/14  1:48 AM  Result Value Ref Range Status   MRSA by PCR NEGATIVE NEGATIVE Final    Comment:        The GeneXpert MRSA Assay (FDA approved for NASAL specimens only), is one component of a comprehensive MRSA colonization surveillance program. It is not intended to diagnose MRSA infection nor to guide or monitor treatment for MRSA infections.   Culture, Urine     Status: None   Collection Time: 06/10/14  9:05 PM  Result Value Ref Range Status   Specimen Description URINE, RANDOM  Final   Special Requests Normal  Final   Colony Count NO GROWTH Performed at [facility]   Final   Culture NO GROWTH Performed at [facility]   Final   Report Status 06/12/2014 FINAL  Final       Today   Subjective:   Kendra Mcfarland today has no headache,no chest abdominal pain,no new weakness tingling or numbness, feels much better wants to go home today.   Objective:   Blood pressure 123/67, pulse 87, temperature 98.2 F (36.8 C), temperature source Oral,  resp. rate 16, height 5\' 1"  (1.549  m), weight 63.231 kg (139 lb 6.4 oz), SpO2 96 %.   Intake/Output Summary (Last 24 hours) at 06/14/14 0953 Last data filed at 06/14/14 0545  Gross per 24 hour  Intake    240 ml  Output   1600 ml  Net  -1360 ml    Exam Awake Alert, Oriented x 3, No new F.N deficits, Normal affect .AT,PERRAL Supple Neck,No JVD, No cervical lymphadenopathy appriciated.  Symmetrical Chest wall movement, Good air movement bilaterally, CTAB RRR,No Gallops,Rubs or new Murmurs, No Parasternal Heave +ve B.Sounds, Abd Soft, Non tender, No organomegaly appriciated, No rebound -guarding or rigidity. No Cyanosis, Clubbing or edema, No new Rash or bruise  Data Review   CBC w Diff:  Lab Results  Component Value Date   WBC 12.6* 06/14/2014   WBC 10.4* 03/18/2014   HGB 11.9* 06/14/2014   HGB 9.8* 03/18/2014   HCT 38.3 06/14/2014   HCT 31.8* 03/18/2014   PLT 1292* 06/14/2014   PLT 672* 03/18/2014   LYMPHOPCT 25 06/14/2014   LYMPHOPCT 18.4 03/18/2014   MONOPCT 11 06/14/2014   MONOPCT 9.9 03/18/2014   EOSPCT 6* 06/14/2014   EOSPCT 1.8 03/18/2014   BASOPCT 0 06/14/2014   BASOPCT 1.3 03/18/2014    CMP:  Lab Results  Component Value Date   NA 138 06/14/2014   NA 143 11/14/2013   K 3.9 06/14/2014   K 2.9* 11/14/2013   CL 102 06/14/2014   CO2 30 06/14/2014   CO2 37* 11/14/2013   BUN 15 06/14/2014   BUN 10.8 11/14/2013   CREATININE 1.29* 06/14/2014   CREATININE 0.9 11/14/2013   PROT 6.4 06/14/2014   PROT 7.0 11/14/2013   ALBUMIN 2.9* 06/14/2014   ALBUMIN 2.4* 11/14/2013   BILITOT 0.4 06/14/2014   BILITOT 0.54 11/14/2013   ALKPHOS 103 06/14/2014   ALKPHOS 133 11/14/2013   AST 23 06/14/2014   AST 17 11/14/2013   ALT 13 06/14/2014   ALT 18 11/14/2013  .   Total Time in preparing paper work, data evaluation and todays exam - 35 minutes  Empress Newmann M.D on 06/14/2014 at 9:53 AM  [facility] Office  [phone]

## 2014-06-18 ENCOUNTER — Telehealth: Payer: Self-pay | Admitting: Internal Medicine

## 2014-06-18 NOTE — Telephone Encounter (Signed)
Kendra Mcfarland needs verbal order for speech therapist to see pt   for memory issues

## 2014-06-19 ENCOUNTER — Encounter: Payer: Self-pay | Admitting: Internal Medicine

## 2014-06-19 ENCOUNTER — Ambulatory Visit (INDEPENDENT_AMBULATORY_CARE_PROVIDER_SITE_OTHER): Payer: BLUE CROSS/BLUE SHIELD | Admitting: Internal Medicine

## 2014-06-19 ENCOUNTER — Ambulatory Visit (INDEPENDENT_AMBULATORY_CARE_PROVIDER_SITE_OTHER)
Admission: RE | Admit: 2014-06-19 | Discharge: 2014-06-19 | Disposition: A | Discharge status: Home / Self Care | Payer: BLUE CROSS/BLUE SHIELD | Source: Ambulatory Visit | Attending: Internal Medicine | Admitting: Internal Medicine

## 2014-06-19 ENCOUNTER — Ambulatory Visit: Payer: Self-pay | Admitting: Internal Medicine

## 2014-06-19 VITALS — BP 132/70 | Temp 97.7°F | Ht 59.0 in | Wt 139.5 lb

## 2014-06-19 DIAGNOSIS — I6783 Posterior reversible encephalopathy syndrome: Secondary | ICD-10-CM

## 2014-06-19 DIAGNOSIS — C642 Malignant neoplasm of left kidney, except renal pelvis: Secondary | ICD-10-CM

## 2014-06-19 DIAGNOSIS — D75839 Thrombocytosis, unspecified: Secondary | ICD-10-CM

## 2014-06-19 DIAGNOSIS — Z9081 Acquired absence of spleen: Secondary | ICD-10-CM

## 2014-06-19 DIAGNOSIS — I10 Essential (primary) hypertension: Secondary | ICD-10-CM

## 2014-06-19 DIAGNOSIS — Z905 Acquired absence of kidney: Secondary | ICD-10-CM | POA: Insufficient documentation

## 2014-06-19 DIAGNOSIS — Z09 Encounter for follow-up examination after completed treatment for conditions other than malignant neoplasm: Secondary | ICD-10-CM

## 2014-06-19 DIAGNOSIS — R946 Abnormal results of thyroid function studies: Secondary | ICD-10-CM

## 2014-06-19 DIAGNOSIS — R0781 Pleurodynia: Secondary | ICD-10-CM

## 2014-06-19 DIAGNOSIS — D473 Essential (hemorrhagic) thrombocythemia: Secondary | ICD-10-CM

## 2014-06-19 DIAGNOSIS — R7989 Other specified abnormal findings of blood chemistry: Secondary | ICD-10-CM

## 2014-06-19 NOTE — Patient Instructions (Signed)
Exam is non alarming but cannot say exaclty why you have the pain .   If getting worse let us know and reevaluate .   Will plan on getting neuro to help fu of the pres   And mri   Plan .  Check blood pressure readings 2 days  A week  Must be below 140/90 .  Plan lab in in 2-3 weeks BMP cbcdiff and tsh free t4   Then rov

## 2014-06-19 NOTE — Progress Notes (Signed)
Chief Complaint  Patient presents with  . Hospitalization Follow-up    HPI: Kendra Mcfarland 64 y.o. comes in with husband  after  Left nephrctomy for renal cell ca  stage 3 or [facility] complicated by infection post op .spenectomy  For "dead spleen"  .  And then post op acute neuro sx and vision change left side indifferent  ST memory loss  Stroke like sx and was seen  hosp [facility] and felt to have PRES    also had elevation in creatinine and bp meds changed off teh ace to norvasc and carvedilol      brainMri advised for fu   After 3 weeks or soto ensure /confirm dx correct By hosp team and neuro as well as Fu of thrombocytosis   Felt to be from splenectomy and not at risk  Hospitalization for pres sx was 4 days   After   [facility]  [facility]  Since she has been home her husband says she is doing much better still has decreased vision on the left cannot read speech therapy may be able to help with reading no falling getting physical therapy and OT. Pres uncertain cause and initially family thought from antibiotic but consult poss from transfusion .    yesterday used  [employer]  to grocery store .  [place]   VEating normally .    No falling fevers UTI symptoms she has had one day of sharp type pains right under both rib cages when she takes a deep breath [place] over when sitting. No cough fever vomiting radiation to the back abdomen or the shoulder. No excess edema unusual bleeding.   was also noted to have an elevated TSH mildly family history of thyroid disease will need follow-up. ROS: See pertinent positives and negatives per HPI.  Past Medical History  Diagnosis Date  . Anemia     years ago  . Transfusion history     before her hysterectomy  . Anxiety   . Hyperlipidemia   . Hypertension   . Spinal stenosis   . Spondylosis     degenerative adult  . Bulging lumbar disc   . MONONUCLEOSIS 08/14/2007    Qualifier: Diagnosis of  Problem Stop Reason: Removed By: Hulan Saas,  CMA (AAMA), [place] Skye   . Vertigo   . Iron disorder     excessive iron storage   . Cancer     Family History  Problem Relation Age of Onset  . Hypertension Mother   . Arthritis Mother   . Hyperlipidemia Mother   . Memory loss Mother 32    short term  . Other Mother 48    angioplasty  . Arthritis Father   . Hypertension Father   . Heart attack Father   . Hyperthyroidism Sister   . Anemia Sister   . Hypothyroidism Sister   . Hyperlipidemia Sister   . Colon cancer Neg Hx   . Esophageal cancer Neg Hx   . Rectal cancer Neg Hx   . Stomach cancer Neg Hx     History   Social History  . Marital Status: Married    Spouse Name: N/A    Number of Children: N/A  . Years of Education: N/A   Social History Main Topics  . Smoking status: Former Smoker    Quit date: 05/17/1994  . Smokeless tobacco: Never Used  . Alcohol Use: Yes     Comment: rare  . Drug Use:  No  . Sexual Activity: Yes    Birth Control/ Protection: None   Other Topics Concern  . None   Social History Narrative   No ets   Regular exercise- thinking about yoga  As per dr Ellene Route.    HH of 2    Pet dog puppy 6 months    Good sleep   G2P2    Outpatient Encounter Prescriptions as of 06/19/2014  Medication Sig  . acetaminophen (TYLENOL) 325 MG tablet Take 650 mg by mouth every 6 (six) hours as needed for mild pain.   Marland Kitchen amLODipine (NORVASC) 10 MG tablet Take 1 tablet (10 mg total) by mouth daily.  . carvedilol (COREG) 3.125 MG tablet Take 1 tablet (3.125 mg total) by mouth 2 (two) times daily with a meal.  . cholecalciferol (VITAMIN D) 1000 UNITS tablet Take 1,000 Units by mouth daily.  . clonazePAM (KLONOPIN) 0.5 MG tablet Take 1 tablet (0.5 mg total) by mouth 2 (two) times daily.  . Cyanocobalamin (VITAMIN B-12) 5000 MCG SUBL Place 1 tablet under the tongue daily.  Marland Kitchen docusate sodium (COLACE) 100 MG capsule Take 100 mg by mouth 2 (two) times daily.  Marland Kitchen esomeprazole (NEXIUM) 40 MG capsule Take 1 capsule (40 mg  total) by mouth daily before breakfast.  . Multiple Vitamin (MULTIVITAMIN WITH MINERALS) TABS tablet Take 1 tablet by mouth daily.  . rosuvastatin (CRESTOR) 10 MG tablet Take 1 tablet (10 mg total) by mouth daily.  Marland Kitchen senna (SENOKOT) 8.6 MG TABS tablet Take 1 tablet by mouth daily.    EXAM:  BP 132/70 mmHg  Temp(Src) 97.7 F (36.5 C) (Oral)  Ht 4\' 11"  (1.499 m)  Wt 139 lb 8 oz (63.277 kg)  BMI 28.16 kg/m2  Body mass index is 28.16 kg/(m^2).  GENERAL: vitals reviewed and listed above, alert, oriented, appears well hydrated and in no acute distress HEENT: atraumatic, conjunctiva  clear, no obvious abnormalities on inspection of external nose and ears  Verbal understandable in nad  Here with husband  Gait slightly unsteady but  Independent  NECK: no obvious masses on inspection palpation  LUNGS: clear to auscultation bilaterally, no wheezes, rales or rhonchi,  Poss dec BSlung bases  CV: HRRR, no clubbing cyanosis or  peripheral edema nl cap refill   abdomen:   Healing nephrectomy scars and laparoscopic scars. No acute masses or bruising. Rib cage squeeze nontender. Points to 2 areas right up under her rib cage on the right and left in the abdomen as area of tenderness no peritoneal side guarding rebound negative psoas sign MS: moves all extremities without noticeable acute focal  Abnormality  memory not tested moves all extremities is right-handed EOMs are present  PSYCH: pleasant and cooperative,  Appropriate for the situation no tremor is noted. Lab Results  Component Value Date   WBC 12.6* 06/14/2014   HGB 11.9* 06/14/2014   HCT 38.3 06/14/2014   PLT 1292* 06/14/2014   GLUCOSE 83 06/14/2014   CHOL 142 06/12/2014   TRIG 235* 06/12/2014   HDL 32* 06/12/2014   LDLDIRECT 214.5 03/28/2008   LDLCALC 63 06/12/2014   ALT 13 06/14/2014   AST 23 06/14/2014   NA 138 06/14/2014   K 3.9 06/14/2014   CL 102 06/14/2014   CREATININE 1.29* 06/14/2014   BUN 15 06/14/2014   CO2 30 06/14/2014    TSH 4.575* 06/13/2014   INR 1.08 02/28/2014    ASSESSMENT AND PLAN:  Discussed the following assessment and plan:  Pleuritic pain diaphramatic  area  - not chest pain more upper abdomen subdiaphragmatic pain bilateral equal without radiation or other alarming signs unclear cause - Plan: DG Chest 2 View  PRES (posterior reversible encephalopathy syndrome) -  reportedly significant improvement since  onset getting physical therapy agree wiith   speecch therapy and help with reading ; loww--uup  MRI anndd  n  Renal cell carcinoma, left - removed   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 On Monday and kidney surgeon Dr. Hartford Poli team on Wednesday of next week. Plan neuro referral also Total visit 31mins > 50% spent counseling and coordinating care  -Patient advised to return or notify health care team  if symptoms worsen ,persist or new concerns arise.  Patient Instructions  Exam is non alarming but cannot say exaclty why you have the pain .   If getting worse let us know and reevaluate .   Will plan on getting neuro to help fu of the pres   And mri   Plan .  Check blood pressure readings 2 days  A week  Must be below 140/90 .  Plan lab in in 2-3 weeks BMP cbcdiff and tsh free t4   Then rov      Leanette Eutsler K. Finneus Kaneshiro M.D.  See Chest  x ray report  :  Small left effusion  Opacity  [facility] of [facility] reports 1 19?  Stable bilateral effusions with opacity  Cannot see image .  To compare   Fraser Din and husband aware  She has no sx o pna or infection at this time . Had broad spectrum meds  With post op fever  Felt uti  Neg cx?   Advise take disc to [facility] to have then compare and in interim  See if our  radiologist can see image . To decide on plan for fu   [place] is a pleasant 64 y.o. femalewith a history of renal cell carcinoma status post nephrectomy and splenectomy performed at [facility] on 05/31/2014 who came in with blurry vision involving both eyes that started 1/23 afternoon . This became progressively worse over the course of the afternoon. Towards that evening her husband noted right-sided hemi-neglect, reporting that "she favored her left side." She also developed mental status changes, confusion, disorientation, forgetting how to get around in her own home. She was recently treated with doxycycline for a urinary tract infection. CT scan of the brain without contrast performed in the emergency room noted scattered abnormal hypodensities in the periventricular white matter, corona radiata, occipital and parietal lobes with mass lesion in the right parietal lobe, consistent with intracranial metastatic disease. In the emergency room she was administered 10 mg of IV Decadron. She eventually had brain MRI which showed "Abnormal examination with findings most suggestive of posterior reversible encephalopathy syndrome more notable on the left where there are areas of patchy enhancement and possibly petechial hemorrhage. To confirm this diagnosis, one could obtain follow-up MR in 3-4 weeks (sooner if clinically indicated) as findings should be transient. Arterial thrombosis, metastatic disease or infectious encephalopathy felt to be much less likely considerations. Question abnormal flow within left middle cerebral artery branches. On followup MR,  MR angiogram recommended to help exclude proximal stenosis not appreciated on the current exam". She has improved significantly in terms of vision but has some gait imbalance here and there. She has thrombocytosis felt to be reactionary status post splenectomy. Patient was seen by hematology who didn't think patient needed  antiplatelet agents especially given increased risk for bleeding. Patient has promised not to drive until she follows with her primary care providers and her vision is back to normal. She should follow with her primary care providers at [facility] in the next 2-3 weeks. Of note is that patient's blood pressure medications were changed to amlodipine/Coreg and ACE inhibitor/diuretic was discontinued in view of renal insufficiency. Patient was also noted to have slightly elevated TSH which will need monitoring by her primary care team.  Discharge Condition: Relatively stable   Follow UP Oncology/neurology/general surgery

## 2014-06-20 NOTE — Telephone Encounter (Signed)
Agree with  Order referral .

## 2014-06-23 DIAGNOSIS — Z9081 Acquired absence of spleen: Secondary | ICD-10-CM | POA: Insufficient documentation

## 2014-06-24 NOTE — Telephone Encounter (Signed)
Kendra Mcfarland notified to proceed with speech therapy.

## 2014-07-06 ENCOUNTER — Emergency Department (HOSPITAL_COMMUNITY): Payer: BLUE CROSS/BLUE SHIELD

## 2014-07-06 ENCOUNTER — Encounter (HOSPITAL_COMMUNITY): Payer: Self-pay | Admitting: *Deleted

## 2014-07-06 ENCOUNTER — Inpatient Hospital Stay (HOSPITAL_COMMUNITY)
Admission: EM | Admit: 2014-07-06 | Discharge: 2014-07-11 | DRG: 389 | Disposition: A | Discharge status: Home / Self Care | Payer: BLUE CROSS/BLUE SHIELD | Attending: Internal Medicine | Admitting: Internal Medicine

## 2014-07-06 DIAGNOSIS — M479 Spondylosis, unspecified: Secondary | ICD-10-CM | POA: Diagnosis present

## 2014-07-06 DIAGNOSIS — Z905 Acquired absence of kidney: Secondary | ICD-10-CM | POA: Diagnosis present

## 2014-07-06 DIAGNOSIS — Y838 Other surgical procedures as the cause of abnormal reaction of the patient, or of later complication, without mention of misadventure at the time of the procedure: Secondary | ICD-10-CM | POA: Diagnosis present

## 2014-07-06 DIAGNOSIS — I129 Hypertensive chronic kidney disease with stage 1 through stage 4 chronic kidney disease, or unspecified chronic kidney disease: Secondary | ICD-10-CM | POA: Diagnosis present

## 2014-07-06 DIAGNOSIS — Z9081 Acquired absence of spleen: Secondary | ICD-10-CM

## 2014-07-06 DIAGNOSIS — C642 Malignant neoplasm of left kidney, except renal pelvis: Secondary | ICD-10-CM

## 2014-07-06 DIAGNOSIS — E785 Hyperlipidemia, unspecified: Secondary | ICD-10-CM | POA: Diagnosis present

## 2014-07-06 DIAGNOSIS — Z87891 Personal history of nicotine dependence: Secondary | ICD-10-CM | POA: Diagnosis not present

## 2014-07-06 DIAGNOSIS — E162 Hypoglycemia, unspecified: Secondary | ICD-10-CM | POA: Diagnosis not present

## 2014-07-06 DIAGNOSIS — Z79899 Other long term (current) drug therapy: Secondary | ICD-10-CM

## 2014-07-06 DIAGNOSIS — K5669 Other intestinal obstruction: Secondary | ICD-10-CM | POA: Diagnosis not present

## 2014-07-06 DIAGNOSIS — K5900 Constipation, unspecified: Secondary | ICD-10-CM

## 2014-07-06 DIAGNOSIS — K460 Unspecified abdominal hernia with obstruction, without gangrene: Principal | ICD-10-CM | POA: Diagnosis present

## 2014-07-06 DIAGNOSIS — N179 Acute kidney failure, unspecified: Secondary | ICD-10-CM | POA: Diagnosis present

## 2014-07-06 DIAGNOSIS — D473 Essential (hemorrhagic) thrombocythemia: Secondary | ICD-10-CM | POA: Diagnosis present

## 2014-07-06 DIAGNOSIS — Z85528 Personal history of other malignant neoplasm of kidney: Secondary | ICD-10-CM

## 2014-07-06 DIAGNOSIS — C7931 Secondary malignant neoplasm of brain: Secondary | ICD-10-CM | POA: Diagnosis not present

## 2014-07-06 DIAGNOSIS — K5660 Unspecified intestinal obstruction: Secondary | ICD-10-CM

## 2014-07-06 DIAGNOSIS — I6783 Posterior reversible encephalopathy syndrome: Secondary | ICD-10-CM | POA: Diagnosis present

## 2014-07-06 DIAGNOSIS — N189 Chronic kidney disease, unspecified: Secondary | ICD-10-CM | POA: Diagnosis present

## 2014-07-06 DIAGNOSIS — F419 Anxiety disorder, unspecified: Secondary | ICD-10-CM | POA: Diagnosis present

## 2014-07-06 DIAGNOSIS — K56609 Unspecified intestinal obstruction, unspecified as to partial versus complete obstruction: Secondary | ICD-10-CM | POA: Diagnosis present

## 2014-07-06 DIAGNOSIS — D75839 Thrombocytosis, unspecified: Secondary | ICD-10-CM | POA: Diagnosis present

## 2014-07-06 DIAGNOSIS — K913 Postprocedural intestinal obstruction, unspecified as to partial versus complete: Secondary | ICD-10-CM

## 2014-07-06 DIAGNOSIS — C649 Malignant neoplasm of unspecified kidney, except renal pelvis: Secondary | ICD-10-CM | POA: Diagnosis present

## 2014-07-06 DIAGNOSIS — I10 Essential (primary) hypertension: Secondary | ICD-10-CM | POA: Diagnosis present

## 2014-07-06 LAB — COMPREHENSIVE METABOLIC PANEL
ALT: 39 U/L — ABNORMAL HIGH (ref 0–35)
ANION GAP: 11 (ref 5–15)
AST: 33 U/L (ref 0–37)
Albumin: 3.1 g/dL — ABNORMAL LOW (ref 3.5–5.2)
Alkaline Phosphatase: 230 U/L — ABNORMAL HIGH (ref 39–117)
BUN: 21 mg/dL (ref 6–23)
CO2: 25 mmol/L (ref 19–32)
Calcium: 9.4 mg/dL (ref 8.4–10.5)
Chloride: 99 mmol/L (ref 96–112)
Creatinine, Ser: 1.36 mg/dL — ABNORMAL HIGH (ref 0.50–1.10)
GFR calc non Af Amer: 40 mL/min — ABNORMAL LOW (ref >90)
GFR, EST AFRICAN AMERICAN: 47 mL/min — AB (ref >90)
Glucose, Bld: 100 mg/dL — ABNORMAL HIGH (ref 70–99)
POTASSIUM: 4.2 mmol/L (ref 3.5–5.1)
Sodium: 135 mmol/L (ref 135–145)
Total Bilirubin: 0.4 mg/dL (ref 0.3–1.2)
Total Protein: 7.1 g/dL (ref 6.0–8.3)

## 2014-07-06 LAB — CBC
HCT: 38.6 % (ref 36.0–46.0)
Hemoglobin: 12.4 g/dL (ref 12.0–15.0)
MCH: 26 pg (ref 26.0–34.0)
MCHC: 32.1 g/dL (ref 30.0–36.0)
MCV: 80.9 fL (ref 78.0–100.0)
Platelets: 814 10*3/uL — ABNORMAL HIGH (ref 150–400)
RBC: 4.77 MIL/uL (ref 3.87–5.11)
RDW: 16.3 % — AB (ref 11.5–15.5)
WBC: 14 10*3/uL — AB (ref 4.0–10.5)

## 2014-07-06 LAB — URINALYSIS, ROUTINE W REFLEX MICROSCOPIC
Bilirubin Urine: NEGATIVE
Glucose, UA: NEGATIVE mg/dL
Ketones, ur: NEGATIVE mg/dL
Leukocytes, UA: NEGATIVE
NITRITE: NEGATIVE
Protein, ur: NEGATIVE mg/dL
Specific Gravity, Urine: 1.012 (ref 1.005–1.030)
UROBILINOGEN UA: 0.2 mg/dL (ref 0.0–1.0)
pH: 5 (ref 5.0–8.0)

## 2014-07-06 LAB — LIPASE, BLOOD: Lipase: 31 U/L (ref 11–59)

## 2014-07-06 LAB — URINE MICROSCOPIC-ADD ON

## 2014-07-06 MED ORDER — LIDOCAINE VISCOUS 2 % MT SOLN
15.0000 mL | Freq: Once | OROMUCOSAL | Status: AC
  Administered 2014-07-06: 15 mL via OROMUCOSAL
  Filled 2014-07-06: qty 15

## 2014-07-06 MED ORDER — IOHEXOL 300 MG/ML  SOLN
25.0000 mL | Freq: Once | INTRAMUSCULAR | Status: AC | PRN
  Administered 2014-07-06: 25 mL via ORAL

## 2014-07-06 MED ORDER — FENTANYL CITRATE 0.05 MG/ML IJ SOLN
25.0000 ug | Freq: Once | INTRAMUSCULAR | Status: AC
Start: 2014-07-06 — End: 2014-07-06
  Administered 2014-07-06: 25 ug via INTRAVENOUS
  Filled 2014-07-06: qty 2

## 2014-07-06 MED ORDER — METOCLOPRAMIDE HCL 5 MG/ML IJ SOLN
10.0000 mg | Freq: Once | INTRAMUSCULAR | Status: AC
  Administered 2014-07-06: 10 mg via INTRAVENOUS
  Filled 2014-07-06: qty 2

## 2014-07-06 MED ORDER — LORAZEPAM 2 MG/ML IJ SOLN
1.0000 mg | Freq: Once | INTRAMUSCULAR | Status: AC
  Administered 2014-07-06: 1 mg via INTRAVENOUS
  Filled 2014-07-06: qty 1

## 2014-07-06 MED ORDER — FENTANYL CITRATE 0.05 MG/ML IJ SOLN
50.0000 ug | Freq: Once | INTRAMUSCULAR | Status: AC
  Administered 2014-07-06: 50 ug via INTRAVENOUS
  Filled 2014-07-06: qty 2

## 2014-07-06 MED ORDER — ACETAMINOPHEN 500 MG PO TABS: 500.0000 mg | ORAL_TABLET | Freq: Once | ORAL | Status: DC

## 2014-07-06 MED ORDER — SODIUM CHLORIDE 0.9 % IV BOLUS (SEPSIS)
1000.0000 mL | Freq: Once | INTRAVENOUS | Status: AC
  Administered 2014-07-06: 1000 mL via INTRAVENOUS

## 2014-07-06 NOTE — ED Provider Notes (Signed)
CSN: [id number]     Arrival date & time 07/06/14  1731 History   First MD Initiated Contact with Patient 07/06/14 1755     Chief Complaint  Patient presents with  . Constipation     (Consider location/radiation/quality/duration/timing/severity/associated sxs/prior Treatment) Patient is a 64 y.o. female presenting with constipation. The history is provided by the patient.  Constipation Severity:  Moderate Timing:  Constant Progression:  Unchanged Chronicity:  New Context: stress   Context comment:  Recent surgery - L nephrectomy and splenectomy Relieved by:  Nothing Worsened by:  Nothing tried Associated symptoms: abdominal pain and nausea (occasional)   Associated symptoms: no fever, no flatus and no vomiting     Past Medical History  Diagnosis Date  . Anemia     years ago  . Transfusion history     before her hysterectomy  . Anxiety   . Hyperlipidemia   . Hypertension   . Spinal stenosis   . Spondylosis     degenerative adult  . Bulging lumbar disc   . MONONUCLEOSIS 08/14/2007    Qualifier: Diagnosis of  Problem Stop Reason: Removed By: Hulan Saas, CMA (AAMA), [place] Skye   . Vertigo   . Iron disorder     excessive iron storage   . Cancer    Past Surgical History  Procedure Laterality Date  . Inguinal hernia repair      at birth  . Cesarean section      x 2  . Appendectomy    . Abdominal hysterectomy      bleeding fibroid tumors has ovaries  . Vein protrusion in head as a child    . Blood transfusion from vaginal bleeding     Family History  Problem Relation Age of Onset  . Hypertension Mother   . Arthritis Mother   . Hyperlipidemia Mother   . Memory loss Mother 87    short term  . Other Mother 66    angioplasty  . Arthritis Father   . Hypertension Father   . Heart attack Father   . Hyperthyroidism Sister   . Anemia Sister   . Hypothyroidism Sister   . Hyperlipidemia Sister   . Colon cancer Neg Hx   . Esophageal cancer Neg Hx   . Rectal cancer Neg  Hx   . Stomach cancer Neg Hx    History  Substance Use Topics  . Smoking status: Former Smoker    Quit date: 05/17/1994  . Smokeless tobacco: Never Used  . Alcohol Use: Yes     Comment: rare   OB History    No data available     Review of Systems  Constitutional: Negative for fever and chills.  Respiratory: Negative for cough and shortness of breath.   Gastrointestinal: Positive for nausea (occasional), abdominal pain and constipation. Negative for vomiting and flatus.  All other systems reviewed and are negative.     Allergies  Tramadol; Hydrocodone; Morphine and related; Naproxen sodium; Oxycodone; Penicillins; and Sulfonamide derivatives  Home Medications   Prior to Admission medications   Medication Sig Start Date End Date Taking? Authorizing Provider  acetaminophen (TYLENOL) 325 MG tablet Take 650 mg by mouth every 6 (six) hours as needed for mild pain.     Historical Provider, MD  amLODipine (NORVASC) 10 MG tablet Take 1 tablet (10 mg total) by mouth daily. 06/14/14   Simbiso Ranga, MD  carvedilol (COREG) 3.125 MG tablet Take 1 tablet (3.125 mg total) by mouth 2 (two) times daily with a  meal. 06/14/14   Simbiso Ranga, MD  cholecalciferol (VITAMIN D) 1000 UNITS tablet Take 1,000 Units by mouth daily.    Historical Provider, MD  clonazePAM (KLONOPIN) 0.5 MG tablet Take 1 tablet (0.5 mg total) by mouth 2 (two) times daily. 06/14/14 06/14/15  Simbiso Ranga, MD  Cyanocobalamin (VITAMIN B-12) 5000 MCG SUBL Place 1 tablet under the tongue daily.    Historical Provider, MD  docusate sodium (COLACE) 100 MG capsule Take 100 mg by mouth 2 (two) times daily. 06/05/14   Historical Provider, MD  esomeprazole (NEXIUM) 40 MG capsule Take 1 capsule (40 mg total) by mouth daily before breakfast. 11/08/12   Sable Feil, MD  Multiple Vitamin (MULTIVITAMIN WITH MINERALS) TABS tablet Take 1 tablet by mouth daily.    Historical Provider, MD  rosuvastatin (CRESTOR) 10 MG tablet Take 1 tablet  (10 mg total) by mouth daily. 07/13/13   Burnis Medin, MD   BP 134/61 mmHg  Pulse 110  Temp(Src) 98.2 F (36.8 C)  Resp 18  Ht 4\' 11"  (1.499 m)  Wt 133 lb (60.328 kg)  BMI 26.85 kg/m2  SpO2 96% Physical Exam  Constitutional: She is oriented to person, place, and time. She appears well-developed and well-nourished. No distress.  HENT:  Head: Normocephalic and atraumatic.  Mouth/Throat: Oropharynx is clear and moist.  Eyes: EOM are normal. Pupils are equal, round, and reactive to light.  Neck: Normal range of motion. Neck supple.  Cardiovascular: Normal rate and regular rhythm.  Exam reveals no friction rub.   No murmur heard. Pulmonary/Chest: Effort normal and breath sounds normal. No respiratory distress. She has no wheezes. She has no rales.  Abdominal: Soft. She exhibits no distension. There is tenderness (lower abdomen). There is no rebound.  Musculoskeletal: Normal range of motion. She exhibits no edema.  Neurological: She is alert and oriented to person, place, and time. No cranial nerve deficit. She exhibits normal muscle tone. Coordination normal.  Skin: Skin is warm. No rash noted. She is not diaphoretic.  Nursing note and vitals reviewed.   ED Course  Procedures (including critical care time) Labs Review Labs Reviewed  CBC  COMPREHENSIVE METABOLIC PANEL  LIPASE, BLOOD  URINALYSIS, ROUTINE W REFLEX MICROSCOPIC    Imaging Review Ct Abdomen Pelvis Wo Contrast  07/06/2014   CLINICAL DATA:  Lower abdominal pain and constipation. Status post left nephrectomy and splenectomy 5 weeks ago for renal cell carcinoma. Constipation since the surgery. Lower abdominal pain for the past 2 weeks. The pain is worse today, with nausea and vomiting.  EXAM: CT ABDOMEN AND PELVIS WITHOUT CONTRAST  TECHNIQUE: Multidetector CT imaging of the abdomen and pelvis was performed following the standard protocol without IV contrast.  COMPARISON:  04/10/2014.  FINDINGS: Interval surgical absence of  the spleen and left kidney. Interval dilated proximal and mid jejunum with a transition to normal caliber small bowel in the upper pelvis to the left of midline. This appears to be due to an interval internal hernia, best seen on coronal images 43 and 44. The distal small bowel is normal in caliber and not opacified, as is the colon. A large amount of stool was noted in the rectum.  Also demonstrated is interval soft tissue stranding in the intra-abdominal fat, most pronounced anteriorly on the right at the level of the mid to lower abdomen. Interval midline surgical scar.  Two large gallstones are again demonstrated in the gallbladder. One measures 2.9 cm in maximum diameter and the other measures 2.5 cm  in maximum diameter. No gallbladder or wall thickening or pericholecystic fluid is seen.  Unremarkable non contrasted appearance of the liver, pancreas, adrenal glands, right kidney and urinary bladder. Surgically absent uterus. Normal appearing ovaries. Mildly prominent left para-aortic lymph nodes are again demonstrated. The largest has a short axis diameter of 6 mm on image number 39.  Interval development of an 11 mm subpleural nodule in the posterior aspect of the right lower lobe on image number 12, with an adjacent 5 mm nodule. There is also an interval 6 mm nodule in the right lower lobe laterally on image number 6 and an interval 5 mm nodule in the anterior aspect of the right middle lobe on image 3. There is also a possible developing 3 mm nodule at a vessel branch point in the left lower lobe on image 3. Also noted are coronary artery calcifications.  Thoracolumbar scoliosis and degenerative changes are again demonstrated. 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  IMPRESSION: 1. Interval mid small bowel obstruction due to an internal hernia centered in the midline at the level of the lower abdomen and upper pelvis. 2. Interval  soft tissue stranding in the intraperitoneal fat anteriorly, especially on the right. This could be related to the recent surgery or due to peritonitis. 3. Interval development of 4 lung nodules in the right lower lung zone and a possible developing lung nodule in the left lower lobe. These are concerning for interval metastases. 4. Interval splenectomy and left nephrectomy. 5. Stable borderline enlarged left para-aortic lymph nodes. These could be reactive or early metastatic nodes. 6. Stable cholelithiasis without evidence of cholecystitis. 7. Large amount of stool in the rectum. 8. Atheromatous coronary artery calcifications. 9. Bilateral L5 spondylolysis with associated grade 1 spondylolisthesis at the L5-S1 level.   Electronically Signed   By: Claudie Revering M.D.   On: 07/06/2014 20:47     EKG Interpretation None      MDM   Final diagnoses:  Constipation  Intestinal obstruction, unspecified intestinal obstruction type    75F here with abdominal pain and constipation. About 1 month post-op from a L nephrectomy and splenectomy due to [place]. Only 2 BMs since her discharge from the hospital. Has occasional small amounts of diarrhea, but last BM was 4 days ago. No fevers. No vomiting or nausea. Has had worsening abdominal pain. Had a complicated post-op course with PRES resulting in another hospital stay here at [facility]. Had her surgery done at [facility]. Today with mild lower abdominal pain. Well appearing midline upper abdominal surgical scar. Normal rectal exam, not impacted. Will CT her. CT shows obstruction.  I spoke with Surgery who will see. Medicine admitting since recent admission for PRES.  NG tube placed here by nursing.  Evelina Bucy, MD 07/06/14 [phone]

## 2014-07-06 NOTE — H&P (Signed)
[facility] History and Physical  Kendra Mcfarland:[id number] DOB: [birth year] DOA: 07/06/2014  Referring physician: ED physician PCP: Lottie Dawson, MD   Chief Complaint: abdominal pain  HPI:  Kendra Mcfarland is a 64yo woman with PMH of metastatic renal cell carcinoma on the left s/p nephrectomy and splenectomy on 05/31/14, PRES, anemia, HLD, HTN, spinal stenosis who presents for constipation and abdominal pain.  Kendra Mcfarland reports that since her surgery on 1/15 she has not returned to having normal bowel movements.  She has only had one "good" bowel movement since her surgery and that was 4 days ago.  She recently started taking colace and senna which helped that bowel movement to occur.  Over the past 4 days, she has been having intermittent, crampy to sharp abdominal pain that is sometimes unbearable.  She feels as if she is bloated and has sharp pains due to gas.  She is able to make the pain better sometimes with lying in certain positions, but sometimes she is up all night with the pain.  She is able to sleep at night if she takes a tylenol PM.  She is currently obstipated, with no passing gas or BM X 4 days.  She denies nausea and vomiting at home.  She has only had vomiting once since her surgery and that was during her last hospitalization when she drank contrast.  She also vomited tonight after drinking contrast.  While I was in the room, she began to vomit copiously green liquid.  She had just been given a dose of reglan.    Kendra Mcfarland was recently admitted on 1/23 to this hospital and was diagnosed with PRES.  She was treated with BP lowering medications and her symptoms of confusion and difficulty seeing having improved.  She continues to have difficulty differentiating between some numbers and letters when she reads, but otherwise is doing well.    In the ED, an NGT was placed to lower IMWS and she had a CT of her abdomen/pelvis.  The CT scan showed SBO due to internal  herniation, soft tissue stranding due to surgery or peritonitis, large amount of stool in the rectum.    Assessment and Plan: Small Bowel obstruction due to internal hernia - General surgery has been contacted and will see the patient - Continue NGT to LIM wall suction - Nausea medication and pain medication as needed (zofran and dilaudid) - If no surgery, consider enema to help evacuate colon, which is full of stool.   - NPO - IVF with NS at 125cc/hr - CT scan showed possible peritonitis, she does have an elevated WBC.  If fever, would start cipro/flagyl  Metastatic renal cell carcinoma s/p nephrectomy and splenectomy - CT scan showed possible new metastases to lungs, consider contacting patient's oncologist at [facility] to discuss further vs. CT scan of chest to further evaluation - continue pain control  PRES (posterior reversible encephalopathy syndrome) - Concerning issue as she will not be able take oral medications - IV metoprolol BID ordered with PRN IV hydralazine - If BP not well controlled, consider IV drip of labetalol while NPO vs. Clonidine patch    Hyperlipidemia - Holding anti-hyperlipidemics, consider restarting once she is able to tolerate PO    Essential hypertension - See PRES above, holding PO medications    Degenerative joint disease of low back - Pain control as noted above    Thrombocytosis - Plt's are around 800 here, monitor, possibly reactive to her cancer   CKD -  Cr 1.36.  Appears to be within her baseline, continue to monitor.       Radiological Exams on Admission: Ct Abdomen Pelvis Wo Contrast  07/06/2014   CLINICAL DATA:  Lower abdominal pain and constipation. Status post left nephrectomy and splenectomy 5 weeks ago for renal cell carcinoma. Constipation since the surgery. Lower abdominal pain for the past 2 weeks. The pain is worse today, with nausea and vomiting.  EXAM: CT ABDOMEN AND PELVIS WITHOUT CONTRAST  TECHNIQUE: Multidetector CT imaging of the  abdomen and pelvis was performed following the standard protocol without IV contrast.  COMPARISON:  04/10/2014.  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 Interval dilated proximal and mid jejunum with a transition to normal caliber small bowel in the upper pelvis to the left of midline. This appears to be due to an interval internal hernia, best seen on coronal images 43 and 44. The distal small bowel is normal in caliber and not opacified, as is the colon. A large amount of stool was noted in the rectum.  Also demonstrated is interval soft tissue stranding in the intra-abdominal fat, most pronounced anteriorly on the right at the level of the mid to lower abdomen. Interval midline surgical scar.  Two large gallstones are again demonstrated in the gallbladder. One measures 2.9 cm in maximum diameter and the other measures 2.5 cm in maximum diameter. No gallbladder or wall thickening or pericholecystic fluid is seen.  Unremarkable non contrasted appearance of the liver, pancreas, adrenal glands, right kidney and urinary bladder. Surgically absent uterus. Normal appearing ovaries. Mildly prominent left para-aortic lymph nodes are again demonstrated. The largest has a short axis diameter of 6 mm on image number 39.  Interval development of an 11 mm subpleural nodule in the posterior aspect of the right lower lobe on image number 12, with an adjacent 5 mm nodule. There is also an interval 6 mm nodule in the right lower lobe laterally on image number 6 and an interval 5 mm nodule in the anterior aspect of the right middle lobe on image 3. There is also a possible developing 3 mm nodule at a vessel branch point in the left lower lobe on image 3. Also noted are coronary artery calcifications.  Thoracolumbar scoliosis and degenerative changes are again demonstrated. 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  IMPRESSION: 1.  Interval mid small bowel obstruction due to an internal hernia centered in the midline at the level of the lower abdomen and upper pelvis. 2. Interval soft tissue stranding in the intraperitoneal fat anteriorly, especially on the right. This could be related to the recent surgery or due to peritonitis. 3. Interval development of 4 lung nodules in the right lower lung zone and a possible developing lung nodule in the left lower lobe. These are concerning for interval metastases. 4. Interval splenectomy and left nephrectomy. 5. Stable borderline enlarged left para-aortic lymph nodes. These could be reactive or early metastatic nodes. 6. Stable cholelithiasis without evidence of cholecystitis. 7. Large amount of stool in the rectum. 8. Atheromatous coronary artery calcifications. 9. Bilateral L5 spondylolysis with associated grade 1 spondylolisthesis at the L5-S1 level.   Electronically Signed   By: Claudie Revering M.D.   On: 07/06/2014 20:47     Code Status: Full Family Communication: Pt and husband at bedside Disposition Plan: Admit for further evaluation    Gilles Chiquito, MD [phone]   Review of Systems:  Constitutional: Negative for fever, chills and malaise/fatigue. Negative for diaphoresis.  HENT: Negative for hearing loss, ear pain, nosebleeds, congestion, sore throat Eyes: + for difficulty distinguishing letters. Negative for blurred vision, double vision, photophobia, pain, discharge and redness.  Respiratory: Negative for cough, hemoptysis, sputum production, shortness of breath   Cardiovascular: Negative for chest pain, leg swelling.  Gastrointestinal: + for nausea, vomiting, obstipation, abdominal pain, Negative for heartburn, blood in stool and melena.  Genitourinary: Negative for dysuria, urgency, frequency Musculoskeletal: + for chronic back pain Negative for myalgias, joint pain and falls.  Skin: Negative for itching and rash.  Neurological: Negative for dizziness and  weakness Psychiatric/Behavioral:  The patient is not nervous/anxious.      Past Medical History  Diagnosis Date  . Anemia     years ago  . Transfusion history     before her hysterectomy  . Anxiety   . Hyperlipidemia   . Hypertension   . Spinal stenosis   . Spondylosis     degenerative adult  . Bulging lumbar disc   . MONONUCLEOSIS 08/14/2007    Qualifier: Diagnosis of  Problem Stop Reason: Removed By: Hulan Saas, CMA (AAMA), [place]   . Vertigo   . Iron disorder     excessive iron storage   . Cancer     Past Surgical History  Procedure Laterality Date  . Inguinal hernia repair      at birth  . Cesarean section      x 2  . Appendectomy    . Abdominal hysterectomy      bleeding fibroid tumors has ovaries  . Vein protrusion in head as a child    . Blood transfusion from vaginal bleeding      Social History:  reports that she quit smoking about 20 years ago. She has never used smokeless tobacco. She reports that she drinks alcohol. She reports that she does not use illicit drugs.  Allergies  Allergen Reactions  . Hydrocodone Nausea And Vomiting  . Morphine And Related Nausea And Vomiting  . Oxycodone Nausea And Vomiting  . Tramadol Nausea And Vomiting  . Naproxen Sodium Hives, Itching and Rash    Can take advil   . Penicillins Hives, Itching and Rash  . Sulfonamide Derivatives Hives, Itching and Rash    dypsnea also    Family History  Problem Relation Age of Onset  . Hypertension Mother   . Arthritis Mother   . Hyperlipidemia Mother   . Memory loss Mother 70    short term  . Other Mother 75    angioplasty  . Arthritis Father   . Hypertension Father   . Heart attack Father   . Hyperthyroidism Sister   . Anemia Sister   . Hypothyroidism Sister   . Hyperlipidemia Sister   . Colon cancer Neg Hx   . Esophageal cancer Neg Hx   . Rectal cancer Neg Hx   . Stomach cancer Neg Hx     Prior to Admission medications   Medication Sig Start Date End Date  Taking? Authorizing Provider  acetaminophen (TYLENOL) 325 MG tablet Take 650 mg by mouth every 6 (six) hours as needed for mild pain.    Yes Historical Provider, MD  amLODipine (NORVASC) 10 MG tablet Take 1 tablet (10 mg total) by mouth daily. 06/14/14  Yes Simbiso Ranga, MD  bisacodyl (DULCOLAX) 5 MG EC tablet Take 15 mg by mouth daily as needed for moderate constipation.   Yes Historical Provider, MD  carvedilol (  COREG) 3.125 MG tablet Take 1 tablet (3.125 mg total) by mouth 2 (two) times daily with a meal. 06/14/14  Yes Simbiso Ranga, MD  cholecalciferol (VITAMIN D) 1000 UNITS tablet Take 1,000 Units by mouth daily.   Yes Historical Provider, MD  clonazePAM (KLONOPIN) 0.5 MG tablet Take 1 tablet (0.5 mg total) by mouth 2 (two) times daily. Patient taking differently: Take 0.5 mg by mouth daily.  06/14/14 06/14/15 Yes Simbiso Ranga, MD  Cyanocobalamin (VITAMIN B-12) 5000 MCG SUBL Place 1 tablet under the tongue daily.   Yes Historical Provider, MD  docusate sodium (COLACE) 100 MG capsule Take 100 mg by mouth 2 (two) times daily. 06/05/14  Yes Historical Provider, MD  esomeprazole (NEXIUM) 40 MG capsule Take 1 capsule (40 mg total) by mouth daily before breakfast. Patient taking differently: Take 40 mg by mouth as needed (for heartburn).  11/08/12  Yes Sable Feil, MD  Multiple Vitamin (MULTIVITAMIN WITH MINERALS) TABS tablet Take 1 tablet by mouth daily.   Yes Historical Provider, MD  psyllium (METAMUCIL) 58.6 % packet Take 1 packet by mouth daily.   Yes Historical Provider, MD  rosuvastatin (CRESTOR) 10 MG tablet Take 1 tablet (10 mg total) by mouth daily. 07/13/13  Yes Burnis Medin, MD  senna (SENOKOT) 8.6 MG TABS tablet Take 1 tablet by mouth daily as needed for mild constipation or moderate constipation.   Yes Historical Provider, MD    Physical Exam: Filed Vitals:   07/06/14 2045 07/06/14 2200 07/06/14 2230 07/06/14 2300  BP: 110/54 121/52 128/62 118/57  Pulse: 112 96 107 95  Temp:       Resp:      Height:      Weight:      SpO2: 95% 95% 95% 95%    Physical Exam  Constitutional: Appears well-developed and well-nourished. + distress with vomiting HENT: Normocephalic. Oropharynx is clear and moist.  NGT in place draining greenish/yellow fluid Eyes: Conjunctivae and EOM are normal. no scleral icterus.  CVS: RR, NR, S1/S2 +, no murmurs Pulmonary: Effort and breath sounds normal, rhonchi, wheezes  Abdominal: Soft. Hypoactive bowel sounds, greater on the right,  + ttp throughout, worse in the right quadrants.  no distension,  rebound or guarding.  She has healing midline surgical scars and on the flank.  Musculoskeletal: No edema and no tenderness.  Neuro: Alert. Normal muscle tone. No cranial nerve deficit. Skin: Skin is warm and dry. No rash noted. Not diaphoretic Psychiatric: Normal mood and affect. Behavior, judgment, thought content normal.   Labs on Admission:  Basic Metabolic Panel:  Recent Labs Lab 07/06/14 1828  NA 135  K 4.2  CL 99  CO2 25  GLUCOSE 100*  BUN 21  CREATININE 1.36*  CALCIUM 9.4   Liver Function Tests:  Recent Labs Lab 07/06/14 1828  AST 33  ALT 39*  ALKPHOS 230*  BILITOT 0.4  PROT 7.1  ALBUMIN 3.1*    Recent Labs Lab 07/06/14 1828  LIPASE 31   CBC:  Recent Labs Lab 07/06/14 1828  WBC 14.0*  HGB 12.4  HCT 38.6  MCV 80.9  PLT 814*    If 7PM-7AM, please contact night-coverage [email or web address] Password [facility] 07/06/2014, 11:51 PM

## 2014-07-06 NOTE — ED Notes (Signed)
The pt had her spleen and lt kidney removed jan 15 for cancer.  No chemo or radiation.   She is taking tylenol m only for pain.  She had her surgery in [place]

## 2014-07-07 ENCOUNTER — Encounter (HOSPITAL_COMMUNITY): Payer: Self-pay | Admitting: *Deleted

## 2014-07-07 ENCOUNTER — Inpatient Hospital Stay (HOSPITAL_COMMUNITY): Payer: BLUE CROSS/BLUE SHIELD

## 2014-07-07 ENCOUNTER — Encounter: Payer: Self-pay | Admitting: Internal Medicine

## 2014-07-07 DIAGNOSIS — C649 Malignant neoplasm of unspecified kidney, except renal pelvis: Secondary | ICD-10-CM

## 2014-07-07 LAB — COMPREHENSIVE METABOLIC PANEL
ALBUMIN: 3.1 g/dL — AB (ref 3.5–5.2)
ALT: 34 U/L (ref 0–35)
AST: 25 U/L (ref 0–37)
Alkaline Phosphatase: 212 U/L — ABNORMAL HIGH (ref 39–117)
Anion gap: 9 (ref 5–15)
BUN: 16 mg/dL (ref 6–23)
CALCIUM: 9.7 mg/dL (ref 8.4–10.5)
CO2: 31 mmol/L (ref 19–32)
Chloride: 101 mmol/L (ref 96–112)
Creatinine, Ser: 1.28 mg/dL — ABNORMAL HIGH (ref 0.50–1.10)
GFR, EST AFRICAN AMERICAN: 50 mL/min — AB (ref >90)
GFR, EST NON AFRICAN AMERICAN: 43 mL/min — AB (ref >90)
Glucose, Bld: 95 mg/dL (ref 70–99)
Potassium: 4.3 mmol/L (ref 3.5–5.1)
Sodium: 141 mmol/L (ref 135–145)
Total Bilirubin: 0.4 mg/dL (ref 0.3–1.2)
Total Protein: 7.1 g/dL (ref 6.0–8.3)

## 2014-07-07 LAB — CBC
HCT: 40.7 % (ref 36.0–46.0)
Hemoglobin: 12.9 g/dL (ref 12.0–15.0)
MCH: 25.9 pg — ABNORMAL LOW (ref 26.0–34.0)
MCHC: 31.7 g/dL (ref 30.0–36.0)
MCV: 81.7 fL (ref 78.0–100.0)
Platelets: 850 10*3/uL — ABNORMAL HIGH (ref 150–400)
RBC: 4.98 MIL/uL (ref 3.87–5.11)
RDW: 16.5 % — ABNORMAL HIGH (ref 11.5–15.5)
WBC: 13.9 10*3/uL — AB (ref 4.0–10.5)

## 2014-07-07 LAB — PROTIME-INR
INR: 1.06 (ref 0.00–1.49)
Prothrombin Time: 13.9 seconds (ref 11.6–15.2)

## 2014-07-07 MED ORDER — PHENOL 1.4 % MT LIQD
1.0000 | OROMUCOSAL | Status: DC | PRN
Start: 2014-07-07 — End: 2014-07-11
  Administered 2014-07-07: 1 via OROMUCOSAL
  Filled 2014-07-07: qty 177

## 2014-07-07 MED ORDER — ONDANSETRON HCL 4 MG/2ML IJ SOLN: 4.0000 mg | Freq: Four times a day (QID) | INTRAMUSCULAR | Status: DC | PRN

## 2014-07-07 MED ORDER — METOPROLOL TARTRATE 1 MG/ML IV SOLN
5.0000 mg | Freq: Two times a day (BID) | INTRAVENOUS | Status: DC
  Administered 2014-07-07 – 2014-07-11 (×9): 5 mg via INTRAVENOUS
  Filled 2014-07-07 (×11): qty 5

## 2014-07-07 MED ORDER — ONDANSETRON HCL 4 MG PO TABS: 4.0000 mg | ORAL_TABLET | Freq: Four times a day (QID) | ORAL | Status: DC | PRN

## 2014-07-07 MED ORDER — SODIUM CHLORIDE 0.9 % IV SOLN
INTRAVENOUS | Status: AC
Start: 2014-07-07 — End: 2014-07-07
  Administered 2014-07-07 (×2): via INTRAVENOUS

## 2014-07-07 MED ORDER — CETYLPYRIDINIUM CHLORIDE 0.05 % MT LIQD
7.0000 mL | Freq: Two times a day (BID) | OROMUCOSAL | Status: DC
  Administered 2014-07-08 – 2014-07-10 (×5): 7 mL via OROMUCOSAL

## 2014-07-07 MED ORDER — HYDRALAZINE HCL 20 MG/ML IJ SOLN: 5.0000 mg | INTRAMUSCULAR | Status: DC | PRN

## 2014-07-07 MED ORDER — ENOXAPARIN SODIUM 40 MG/0.4ML ~~LOC~~ SOLN
40.0000 mg | SUBCUTANEOUS | Status: DC
  Administered 2014-07-07 – 2014-07-11 (×5): 40 mg via SUBCUTANEOUS
  Filled 2014-07-07 (×5): qty 0.4

## 2014-07-07 MED ORDER — DIATRIZOATE MEGLUMINE & SODIUM 66-10 % PO SOLN
90.0000 mL | Freq: Once | ORAL | Status: AC
  Administered 2014-07-07: 90 mL via NASOGASTRIC

## 2014-07-07 MED ORDER — LORAZEPAM 2 MG/ML IJ SOLN
0.5000 mg | Freq: Four times a day (QID) | INTRAMUSCULAR | Status: DC | PRN
  Administered 2014-07-07 – 2014-07-11 (×14): 0.5 mg via INTRAVENOUS
  Filled 2014-07-07 (×15): qty 1

## 2014-07-07 MED ORDER — WHITE PETROLATUM GEL
Status: AC
  Administered 2014-07-07: 13:00:00
  Filled 2014-07-07: qty 1

## 2014-07-07 MED ORDER — CHLORHEXIDINE GLUCONATE 0.12 % MT SOLN
15.0000 mL | Freq: Two times a day (BID) | OROMUCOSAL | Status: DC
  Administered 2014-07-07 – 2014-07-11 (×9): 15 mL via OROMUCOSAL
  Filled 2014-07-07 (×8): qty 15

## 2014-07-07 MED ORDER — ENOXAPARIN SODIUM 30 MG/0.3ML ~~LOC~~ SOLN
30.0000 mg | SUBCUTANEOUS | Status: DC
  Administered 2014-07-07: 30 mg via SUBCUTANEOUS
  Filled 2014-07-07: qty 0.3

## 2014-07-07 MED ORDER — HYDROMORPHONE HCL 1 MG/ML IJ SOLN: 0.5000 mg | INTRAMUSCULAR | Status: DC | PRN

## 2014-07-07 NOTE — Plan of Care (Signed)
Problem: Phase I Progression Outcomes Goal: OOB as tolerated unless otherwise ordered Outcome: Completed/Met Date Met:  07/07/14 Walking in hallways and up in room

## 2014-07-07 NOTE — ED Notes (Signed)
MD at bedside. 

## 2014-07-07 NOTE — Consult Note (Signed)
Reason for Consult:Post-operative small bowel obstruction Referring Physician: Dr. Gilles Chiquito  Kendra Mcfarland is an 64 y.o. female.  HPI: This is a 64 yo female who is about 5 weeks status post laparoscopic left nephrectomy and splenectomy for metastatic renal cell carcinoma. This was performed at [facility]. On my review of their notes, and when questioning the patient, it does not appear that she had any good bowel movements prior to discharge. Over the last several weeks she has only had 1 or 2 bowel movements. She was instructed to take fiber and Senokot but these have not been successful. She became quite distended and had abdominal tenderness yesterday.  She has had crampy pain especially in her lower abdomen. She vomited copious amounts of green liquid in the emergency department. A nasogastric tube was placed prior to my arrival in the emergency department.  The patient was admitted to the hospital about 2 weeks postop with mental status changes and was diagnosed with PRES. This was managed by the medical team and she still has some difficulty with numbers and letters but is otherwise improving.  Despite the fact that all of her surgical care was performed in [facility], the patient chose to come to the emergency department at [facility] for her postoperative complication.    Past Medical History  Diagnosis Date  . Anemia     years ago  . Transfusion history     before her hysterectomy  . Anxiety   . Hyperlipidemia   . Hypertension   . Spinal stenosis   . Spondylosis     degenerative adult  . Bulging lumbar disc   . MONONUCLEOSIS 08/14/2007    Qualifier: Diagnosis of  Problem Stop Reason: Removed By: Hulan Saas, CMA (AAMA), [place]   . Vertigo   . Iron disorder     excessive iron storage   . Cancer     Past Surgical History  Procedure Laterality Date  . Inguinal hernia repair      at birth  . Cesarean section      x 2  . Appendectomy    . Abdominal hysterectomy      bleeding  fibroid tumors has ovaries  . Vein protrusion in head as a child    . Blood transfusion from vaginal bleeding      Family History  Problem Relation Age of Onset  . Hypertension Mother   . Arthritis Mother   . Hyperlipidemia Mother   . Memory loss Mother 53    short term  . Other Mother 27    angioplasty  . Arthritis Father   . Hypertension Father   . Heart attack Father   . Hyperthyroidism Sister   . Anemia Sister   . Hypothyroidism Sister   . Hyperlipidemia Sister   . Colon cancer Neg Hx   . Esophageal cancer Neg Hx   . Rectal cancer Neg Hx   . Stomach cancer Neg Hx     Social History:  reports that she quit smoking about 20 years ago. She has never used smokeless tobacco. She reports that she drinks alcohol. She reports that she does not use illicit drugs.  Allergies:  Allergies  Allergen Reactions  . Hydrocodone Nausea And Vomiting  . Morphine And Related Nausea And Vomiting  . Oxycodone Nausea And Vomiting  . Tramadol Nausea And Vomiting  . Naproxen Sodium Hives, Itching and Rash    Can take advil   . Penicillins Hives, Itching and Rash  . Sulfonamide  Derivatives Hives, Itching and Rash    dypsnea also    Medications:  Prior to Admission medications   Medication Sig Start Date End Date Taking? Authorizing Provider  acetaminophen (TYLENOL) 325 MG tablet Take 650 mg by mouth every 6 (six) hours as needed for mild pain.    Yes Historical Provider, MD  amLODipine (NORVASC) 10 MG tablet Take 1 tablet (10 mg total) by mouth daily. 06/14/14  Yes Simbiso Ranga, MD  bisacodyl (DULCOLAX) 5 MG EC tablet Take 15 mg by mouth daily as needed for moderate constipation.   Yes Historical Provider, MD  carvedilol (COREG) 3.125 MG tablet Take 1 tablet (3.125 mg total) by mouth 2 (two) times daily with a meal. 06/14/14  Yes Simbiso Ranga, MD  cholecalciferol (VITAMIN D) 1000 UNITS tablet Take 1,000 Units by mouth daily.   Yes Historical Provider, MD  clonazePAM (KLONOPIN) 0.5 MG  tablet Take 1 tablet (0.5 mg total) by mouth 2 (two) times daily. Patient taking differently: Take 0.5 mg by mouth daily.  06/14/14 06/14/15 Yes Simbiso Ranga, MD  Cyanocobalamin (VITAMIN B-12) 5000 MCG SUBL Place 1 tablet under the tongue daily.   Yes Historical Provider, MD  docusate sodium (COLACE) 100 MG capsule Take 100 mg by mouth 2 (two) times daily. 06/05/14  Yes Historical Provider, MD  esomeprazole (NEXIUM) 40 MG capsule Take 1 capsule (40 mg total) by mouth daily before breakfast. Patient taking differently: Take 40 mg by mouth as needed (for heartburn).  11/08/12  Yes Sable Feil, MD  Multiple Vitamin (MULTIVITAMIN WITH MINERALS) TABS tablet Take 1 tablet by mouth daily.   Yes Historical Provider, MD  psyllium (METAMUCIL) 58.6 % packet Take 1 packet by mouth daily.   Yes Historical Provider, MD  rosuvastatin (CRESTOR) 10 MG tablet Take 1 tablet (10 mg total) by mouth daily. 07/13/13  Yes Burnis Medin, MD  senna (SENOKOT) 8.6 MG TABS tablet Take 1 tablet by mouth daily as needed for mild constipation or moderate constipation.   Yes Historical Provider, MD     Results for orders placed or performed during the hospital encounter of 07/06/14 (from the past 48 hour(s))  CBC     Status: Abnormal   Collection Time: 07/06/14  6:28 PM  Result Value Ref Range   WBC 14.0 (H) 4.0 - 10.5 K/uL   RBC 4.77 3.87 - 5.11 MIL/uL   Hemoglobin 12.4 12.0 - 15.0 g/dL   HCT 38.6 36.0 - 46.0 %   MCV 80.9 78.0 - 100.0 fL   MCH 26.0 26.0 - 34.0 pg   MCHC 32.1 30.0 - 36.0 g/dL   RDW 16.3 (H) 11.5 - 15.5 %   Platelets 814 (H) 150 - 400 K/uL  Comprehensive metabolic panel     Status: Abnormal   Collection Time: 07/06/14  6:28 PM  Result Value Ref Range   Sodium 135 135 - 145 mmol/L   Potassium 4.2 3.5 - 5.1 mmol/L   Chloride 99 96 - 112 mmol/L   CO2 25 19 - 32 mmol/L   Glucose, Bld 100 (H) 70 - 99 mg/dL   BUN 21 6 - 23 mg/dL   Creatinine, Ser 1.36 (H) 0.50 - 1.10 mg/dL   Calcium 9.4 8.4 - 10.5  mg/dL   Total Protein 7.1 6.0 - 8.3 g/dL   Albumin 3.1 (L) 3.5 - 5.2 g/dL   AST 33 0 - 37 U/L   ALT 39 (H) 0 - 35 U/L   Alkaline Phosphatase 230 (H) 39 -  117 U/L   Total Bilirubin 0.4 0.3 - 1.2 mg/dL   GFR calc non Af Amer 40 (L) >90 mL/min   GFR calc Af Amer 47 (L) >90 mL/min    Comment: (NOTE) The eGFR has been calculated using the CKD EPI equation. This calculation has not been validated in all clinical situations. eGFR's persistently <90 mL/min signify possible Chronic Kidney Disease.    Anion gap 11 5 - 15  Lipase, blood     Status: None   Collection Time: 07/06/14  6:28 PM  Result Value Ref Range   Lipase 31 11 - 59 U/L  Urinalysis, Routine w reflex microscopic     Status: Abnormal   Collection Time: 07/06/14  8:10 PM  Result Value Ref Range   Color, Urine YELLOW YELLOW   APPearance CLEAR CLEAR   Specific Gravity, Urine 1.012 1.005 - 1.030   pH 5.0 5.0 - 8.0   Glucose, UA NEGATIVE NEGATIVE mg/dL   Hgb urine dipstick TRACE (A) NEGATIVE   Bilirubin Urine NEGATIVE NEGATIVE   Ketones, ur NEGATIVE NEGATIVE mg/dL   Protein, ur NEGATIVE NEGATIVE mg/dL   Urobilinogen, UA 0.2 0.0 - 1.0 mg/dL   Nitrite NEGATIVE NEGATIVE   Leukocytes, UA NEGATIVE NEGATIVE  Urine microscopic-add on     Status: None   Collection Time: 07/06/14  8:10 PM  Result Value Ref Range   WBC, UA 0-2 <3 WBC/hpf    Ct Abdomen Pelvis Wo Contrast  07/06/2014   CLINICAL DATA:  Lower abdominal pain and constipation. Status post left nephrectomy and splenectomy 5 weeks ago for renal cell carcinoma. Constipation since the surgery. Lower abdominal pain for the past 2 weeks. The pain is worse today, with nausea and vomiting.  EXAM: CT ABDOMEN AND PELVIS WITHOUT CONTRAST  TECHNIQUE: Multidetector CT imaging of the abdomen and pelvis was performed following the standard protocol without IV contrast.  COMPARISON:  04/10/2014.  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 Interval dilated proximal  and mid jejunum with a transition to normal caliber small bowel in the upper pelvis to the left of midline. This appears to be due to an interval internal hernia, best seen on coronal images 43 and 44. The distal small bowel is normal in caliber and not opacified, as is the colon. A large amount of stool was noted in the rectum.  Also demonstrated is interval soft tissue stranding in the intra-abdominal fat, most pronounced anteriorly on the right at the level of the mid to lower abdomen. Interval midline surgical scar.  Two large gallstones are again demonstrated in the gallbladder. One measures 2.9 cm in maximum diameter and the other measures 2.5 cm in maximum diameter. No gallbladder or wall thickening or pericholecystic fluid is seen.  Unremarkable non contrasted appearance of the liver, pancreas, adrenal glands, right kidney and urinary bladder. Surgically absent uterus. Normal appearing ovaries. Mildly prominent left para-aortic lymph nodes are again demonstrated. The largest has a short axis diameter of 6 mm on image number 39.  Interval development of an 11 mm subpleural nodule in the posterior aspect of the right lower lobe on image number 12, with an adjacent 5 mm nodule. There is also an interval 6 mm nodule in the right lower lobe laterally on image number 6 and an interval 5 mm nodule in the anterior aspect of the right middle lobe on image 3. There is also a possible developing 3 mm nodule at a vessel branch point in the left lower lobe  on image 3. Also noted are coronary artery calcifications.  Thoracolumbar scoliosis and degenerative changes are again demonstrated. 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  IMPRESSION: 1. Interval mid small bowel obstruction due to an internal hernia centered in the midline at the level of the lower abdomen and upper pelvis. 2. Interval soft tissue stranding in the intraperitoneal fat  anteriorly, especially on the right. This could be related to the recent surgery or due to peritonitis. 3. Interval development of 4 lung nodules in the right lower lung zone and a possible developing lung nodule in the left lower lobe. These are concerning for interval metastases. 4. Interval splenectomy and left nephrectomy. 5. Stable borderline enlarged left para-aortic lymph nodes. These could be reactive or early metastatic nodes. 6. Stable cholelithiasis without evidence of cholecystitis. 7. Large amount of stool in the rectum. 8. Atheromatous coronary artery calcifications. 9. Bilateral L5 spondylolysis with associated grade 1 spondylolisthesis at the L5-S1 level.   Electronically Signed   By: Claudie Revering M.D.   On: 07/06/2014 20:47   Dg Abd Portable 1v  07/07/2014   CLINICAL DATA:  Nasogastric tube placement  EXAM: PORTABLE ABDOMEN - 1 VIEW  COMPARISON:  CT 07/06/2014  FINDINGS: Nasogastric tube extends into the stomach with tip in the region of the mid gastric body. There is persistent small bowel dilatation in the left abdomen. No free air is evident on this single supine portable radiograph.  IMPRESSION: Nasogastric tube extends into the stomach with tip in the mid gastric body.   Electronically Signed   By: Andreas Newport M.D.   On: 07/07/2014 00:13    Review of Systems  Constitutional: Negative for weight loss.  HENT: Negative for ear discharge, ear pain, hearing loss and tinnitus.   Eyes: Negative for blurred vision, double vision, photophobia and pain.  Respiratory: Negative for cough, sputum production and shortness of breath.   Cardiovascular: Negative for chest pain.  Gastrointestinal: Positive for nausea, vomiting, abdominal pain and constipation.  Genitourinary: Negative for dysuria, urgency, frequency and flank pain.  Musculoskeletal: Negative for myalgias, back pain, joint pain, falls and neck pain.  Neurological: Negative for dizziness, tingling, sensory change, focal  weakness, loss of consciousness and headaches.  Endo/Heme/Allergies: Does not bruise/bleed easily.  Psychiatric/Behavioral: Negative for depression, memory loss and substance abuse. The patient is not nervous/anxious.    Blood pressure 115/48, pulse 106, temperature 98.2 F (36.8 C), resp. rate 18, height $RemoveBe'4\' 11"'aeYnngvUB$  (1.499 m), weight 133 lb (60.328 kg), SpO2 94 %. Physical Exam WDWN in NAD HEENT:  EOMI, sclera anicteric:  Nasogastric tube in place Neck:  No masses, no thyromegaly Lungs:  CTA bilaterally; normal respiratory effort CV:  Regular rate and rhythm; no murmurs Abd:  Soft, hypoactive bowel sounds; non-distended; minimal lower abdominal tenderness; healing incisions with no sign of infection or hernia Ext:  Well-perfused; no edema Skin:  Warm, dry; no sign of jaundice  Assessment/Plan: Early post-operative small bowel obstruction - likely from adhesions. No sign of peritonitis Constipation   Recommendations:  NG tube to ILWS Will administer 90 mls of Gastrografin 2 hours after NG tube placed.  Clamp for 1 hour, then replace to suction.  Plain abdominal film in hours to see if contrast has made it to the right colon.   Primary team may consider CT chest to better evaluate lung nodules If the patient does not progress and needs to be explored for adhesiolysis, her risk  of surgery is higher because of the close time proximity to her abdominal surgery.  The adhesions will likely still be inflamed and dense, which may increase the risk of enterotomy or need for bowel resection.  I explained this to the patient.    Jenah Vanasten K. 07/07/2014, 12:45 AM

## 2014-07-07 NOTE — ED Notes (Signed)
Transporting patient to new room assignment. 

## 2014-07-07 NOTE — Progress Notes (Signed)
Patient Demographics  Kendra Mcfarland, is a 64 y.o. female, DOB - 1951-03-09, ZOX:[id number]  Admit date - 07/06/2014   Admitting Physician Sid Falcon, MD  Outpatient Primary MD for the patient is Lottie Dawson, MD  LOS - 1   Chief Complaint  Patient presents with  . Constipation      Admission history of present illness/brief narrative: Kendra Mcfarland is a 64yo woman with PMH of metastatic renal cell carcinoma on the left s/p nephrectomy and splenectomy on 05/31/14, PRES, anemia, HLD, HTN, spinal stenosis who presents for constipation and abdominal pain.Resents with complaints of abdominal pain, nausea and vomiting, of green bilious material, reports since surgery had poor bowel movement.  Kendra Mcfarland was recently admitted on 1/23 to this hospital and was diagnosed with PRES. She was treated with BP lowering medications and her symptoms of confusion and difficulty seeing having improved. She continues to have difficulty differentiating between some numbers and letters when she reads, but otherwise is doing well.   In the ED, an NGT was placed to lower IMWS and she had a CT of her abdomen/pelvis. The CT scan showed SBO due to internal herniation, soft tissue stranding due to surgery or peritonitis, large amount of stool in the rectum. As well did show evidence of new pulmonary nodules not present on previous scan.  Assessment and Plan: Subjective:   Kendra Mcfarland today has, No headache, No chest pain,  No new weakness tingling or numbness, No Cough - SOB. Reports nausea and mild abdominal pain.  Assessment & Plan    Active Problems:   Hyperlipidemia   Essential hypertension   Degenerative joint disease of low back   Metastatic renal cell carcinoma   PRES (posterior reversible encephalopathy syndrome)   Thrombocytosis   S/p nephrectomy   S/P  splenectomy   Bowel obstruction  Small Bowel obstruction due to internal hernia - General surgery consult appreciated - Continue NGT to LIM wall suction - Nausea medication and pain medication as needed (zofran and dilaudid) - NPO - IVF  - CT scan showed possible peritonitis, she does have an elevated WBC. If fever, would start cipro/flagyl (remains a febrile0 - Possible need for surgery.  Metastatic renal cell carcinoma s/p nephrectomy and splenectomy - CT scan showed possible new metastases to lungs. - continue pain control  PRES (posterior reversible encephalopathy syndrome) - Concerning issue as she will not be able take oral medications - IV metoprolol BID ordered with PRN IV hydralazine - If BP not well controlled, will consider consider IV drip of labetalol while NPO vs. Clonidine patch ( so far acceptable)   Hyperlipidemia - Holding anti-hyperlipidemics, consider restarting once she is able to tolerate PO   Essential hypertension - See PRES above, holding PO medications   Degenerative joint disease of low back - Pain control as noted above   Thrombocytosis - Plt's are around 800 here, monitor, possibly reactive to her cancer   CKD - Cr 1.36. Appears to be within her baseline, continue to monitor.    Code Status: Full  Family Communication: With husband over the phone  Disposition Plan: Remains inpatient   Procedures  None   Consults   Surgery   Medications  Scheduled Meds: . antiseptic oral rinse  7 mL Mouth Rinse q12n4p  . chlorhexidine  15 mL Mouth Rinse BID  . [START ON 07/08/2014] enoxaparin (LOVENOX) injection  40 mg Subcutaneous Q24H  . metoprolol  5 mg Intravenous Q12H  . white petrolatum       Continuous Infusions: . sodium chloride 125 mL/hr at 07/07/14 0940   PRN Meds:.hydrALAZINE, HYDROmorphone (DILAUDID) injection, LORazepam, ondansetron **OR** ondansetron (ZOFRAN) IV, phenol  DVT Prophylaxis  Lovenox   Lab Results   Component Value Date   PLT 850* 07/07/2014    Antibiotics    Anti-infectives    None          Objective:   Filed Vitals:   07/07/14 0004 07/07/14 0110 07/07/14 0515 07/07/14 1317  BP: 115/48 134/50 122/42 111/58  Pulse: 106 100 96 98  Temp:  97.6 F (36.4 C) 97.9 F (36.6 C) 98.1 F (36.7 C)  TempSrc:  Oral Oral Oral  Resp:  18 18 18   Height:  4\' 11"  (1.499 m)    Weight:  62.914 kg (138 lb 11.2 oz)    SpO2: 94% 97% 94% 95%    Wt Readings from Last 3 Encounters:  07/07/14 62.914 kg (138 lb 11.2 oz)  06/19/14 63.277 kg (139 lb 8 oz)  04/03/14 67.405 kg (148 lb 9.6 oz)     Intake/Output Summary (Last 24 hours) at 07/07/14 1437 Last data filed at 07/07/14 0915  Gross per 24 hour  Intake    625 ml  Output   1500 ml  Net   -875 ml     Physical Exam  Awake Alert, Oriented X 3, No new F.N deficits, Normal affect [place].AT,PERRAL Supple Neck,No JVD, No cervical lymphadenopathy appriciated.  Symmetrical Chest wall movement, Good air movement bilaterally, CTAB RRR,No Gallops,Rubs or new Murmurs, No Parasternal Heave Soft, nondistended, bowel sounds present, nontender No Cyanosis, Clubbing or edema, No new Rash or bruise     Data Review   Micro Results No results found for this or any previous visit (from the past 240 hour(s)).  Radiology Reports Ct Abdomen Pelvis Wo Contrast  07/06/2014   CLINICAL DATA:  Lower abdominal pain and constipation. Status post left nephrectomy and splenectomy 5 weeks ago for renal cell carcinoma. Constipation since the surgery. Lower abdominal pain for the past 2 weeks. The pain is worse today, with nausea and vomiting.  EXAM: CT ABDOMEN AND PELVIS WITHOUT CONTRAST  TECHNIQUE: Multidetector CT imaging of the abdomen and pelvis was performed following the standard protocol without IV contrast.  COMPARISON:  04/10/2014.  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 Interval dilated proximal and mid jejunum with a  transition to normal caliber small bowel in the upper pelvis to the left of midline. This appears to be due to an interval internal hernia, best seen on coronal images 43 and 44. The distal small bowel is normal in caliber and not opacified, as is the colon. A large amount of stool was noted in the rectum.  Also demonstrated is interval soft tissue stranding in the intra-abdominal fat, most pronounced anteriorly on the right at the level of the mid to lower abdomen. Interval midline surgical scar.  Two large gallstones are again demonstrated in the gallbladder. One measures 2.9 cm in maximum diameter and the other measures 2.5 cm in maximum diameter. No gallbladder or wall thickening or pericholecystic fluid is seen.  Unremarkable non contrasted appearance of the liver, pancreas, adrenal glands, right kidney and urinary bladder. Surgically absent uterus. Normal appearing ovaries.  Mildly prominent left para-aortic lymph nodes are again demonstrated. The largest has a short axis diameter of 6 mm on image number 39.  Interval development of an 11 mm subpleural nodule in the posterior aspect of the right lower lobe on image number 12, with an adjacent 5 mm nodule. There is also an interval 6 mm nodule in the right lower lobe laterally on image number 6 and an interval 5 mm nodule in the anterior aspect of the right middle lobe on image 3. There is also a possible developing 3 mm nodule at a vessel branch point in the left lower lobe on image 3. Also noted are coronary artery calcifications.  Thoracolumbar scoliosis and degenerative changes are again demonstrated. 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  IMPRESSION: 1. Interval mid small bowel obstruction due to an internal hernia centered in the midline at the level of the lower abdomen and upper pelvis. 2. Interval soft tissue stranding in the intraperitoneal fat anteriorly, especially on the  right. This could be related to the recent surgery or due to peritonitis. 3. Interval development of 4 lung nodules in the right lower lung zone and a possible developing lung nodule in the left lower lobe. These are concerning for interval metastases. 4. Interval splenectomy and left nephrectomy. 5. Stable borderline enlarged left para-aortic lymph nodes. These could be reactive or early metastatic nodes. 6. Stable cholelithiasis without evidence of cholecystitis. 7. Large amount of stool in the rectum. 8. Atheromatous coronary artery calcifications. 9. Bilateral L5 spondylolysis with associated grade 1 spondylolisthesis at the L5-S1 level.   Electronically Signed   By: Claudie Revering M.D.   On: 07/06/2014 20:47   Dg Abd Portable 1v  07/07/2014   CLINICAL DATA:  Five weeks post laparoscopic LEFT nephrectomy and splenectomy for metastatic renal cell carcinoma, only a few bowel movements over past several weeks, abdominal distention and tenderness, crampy pain, vomited copious amounts of green liquid in emergency room question small bowel obstruction  EXAM: PORTABLE ABDOMEN - 1 VIEW  COMPARISON:  Abdominal radiograph and CT abdomen of 07/06/2014  FINDINGS: Increased stool in rectum.  Tip of nasogastric tube projects over stomach.  Single dilated small bowel loop in the LEFT mid abdomen.  Gas and stool in RIGHT colon, small amount.  Visualized dilated small bowel loop is similar to that seen on the previous exam consistent with small bowel obstruction.  Minimal retained contrast in stomach prior CT.  No free intraperitoneal air.  Osseous demineralization with dextro convex lumbar scoliosis and scattered degenerative disc/facet disease changes of the lumbar spine.  IMPRESSION: Persistent dilatation of a small bowel loop in the mid abdomen, corresponding to small bowel obstruction identified on recent CT.  No significant interval change in appearance since 07/06/2014 exam.   Electronically Signed   By: Lavonia Dana M.D.    On: 07/07/2014 10:31   Dg Abd Portable 1v  07/07/2014   CLINICAL DATA:  Nasogastric tube placement  EXAM: PORTABLE ABDOMEN - 1 VIEW  COMPARISON:  CT 07/06/2014  FINDINGS: Nasogastric tube extends into the stomach with tip in the region of the mid gastric body. There is persistent small bowel dilatation in the left abdomen. No free air is evident on this single supine portable radiograph.  IMPRESSION: Nasogastric tube extends into the stomach with tip in the mid gastric body.   Electronically Signed   By: Andreas Newport M.D.   On: 07/07/2014 00:13  CBC  Recent Labs Lab 07/06/14 1828 07/07/14 0500  WBC 14.0* 13.9*  HGB 12.4 12.9  HCT 38.6 40.7  PLT 814* 850*  MCV 80.9 81.7  MCH 26.0 25.9*  MCHC 32.1 31.7  RDW 16.3* 16.5*    Chemistries   Recent Labs Lab 07/06/14 1828 07/07/14 0500  NA 135 141  K 4.2 4.3  CL 99 101  CO2 25 31  GLUCOSE 100* 95  BUN 21 16  CREATININE 1.36* 1.28*  CALCIUM 9.4 9.7  AST 33 25  ALT 39* 34  ALKPHOS 230* 212*  BILITOT 0.4 0.4   ------------------------------------------------------------------------------------------------------------------ estimated creatinine clearance is 35.8 mL/min (by C-G formula based on Cr of 1.28). ------------------------------------------------------------------------------------------------------------------ No results for input(s): HGBA1C in the last 72 hours. ------------------------------------------------------------------------------------------------------------------ No results for input(s): CHOL, HDL, LDLCALC, TRIG, CHOLHDL, LDLDIRECT in the last 72 hours. ------------------------------------------------------------------------------------------------------------------ No results for input(s): TSH, T4TOTAL, T3FREE, THYROIDAB in the last 72 hours.  Invalid input(s): FREET3 ------------------------------------------------------------------------------------------------------------------ No results for  input(s): VITAMINB12, FOLATE, FERRITIN, TIBC, IRON, RETICCTPCT in the last 72 hours.  Coagulation profile  Recent Labs Lab 07/07/14 0500  INR 1.06    No results for input(s): DDIMER in the last 72 hours.  Cardiac Enzymes No results for input(s): CKMB, TROPONINI, MYOGLOBIN in the last 168 hours.  Invalid input(s): CK ------------------------------------------------------------------------------------------------------------------ Invalid input(s): POCBNP     Time Spent in minutes   35 minutes   Starr Engel M.D on 07/07/2014 at 2:37 PM  Between 7am to 7pm - Pager - [phone]  After 7pm go to [email or web address] - password [email or web address]  And look for the night coverage person covering for me after hours  [facility] Office  [phone]   **Disclaimer: This note may have been dictated with voice recognition software. Similar sounding words can inadvertently be transcribed and this note may contain transcription errors which may not have been corrected upon publication of note.**

## 2014-07-07 NOTE — Progress Notes (Signed)
Patient ID: Kendra Mcfarland, female   DOB: 12/29/1950, 64 y.o.   MRN: [id number]    Subjective: No flatus this am.  No other complaints  Objective: Vital signs in last 24 hours: Temp:  [97.6 F (36.4 C)-98.2 F (36.8 C)] 97.9 F (36.6 C) (02/21 0515) Pulse Rate:  [88-115] 96 (02/21 0515) Resp:  [18] 18 (02/21 0515) BP: (110-143)/(27-75) 122/42 mmHg (02/21 0515) SpO2:  [93 %-97 %] 94 % (02/21 0515) Weight:  [133 lb (60.328 kg)-138 lb 11.2 oz (62.914 kg)] 138 lb 11.2 oz (62.914 kg) (02/21 0110) Last BM Date: 07/03/14  Intake/Output from previous day: 02/20 0701 - 02/21 0700 In: 625 [I.V.:625] Out: 1500 [Emesis/NG output:1000] Intake/Output this shift:    PE: Abd: soft, not really distended, some BS, NGT with some bilious output, doesn't seem tender Heart: regular Lungs: CTAB  Lab Results:   Recent Labs  07/06/14 1828 07/07/14 0500  WBC 14.0* 13.9*  HGB 12.4 12.9  HCT 38.6 40.7  PLT 814* 850*   BMET  Recent Labs  07/06/14 1828 07/07/14 0500  NA 135 141  K 4.2 4.3  CL 99 101  CO2 25 31  GLUCOSE 100* 95  BUN 21 16  CREATININE 1.36* 1.28*  CALCIUM 9.4 9.7   PT/INR  Recent Labs  07/07/14 0500  LABPROT 13.9  INR 1.06   CMP     Component Value Date/Time   NA 141 07/07/2014 0500   NA 143 11/14/2013 1040   K 4.3 07/07/2014 0500   K 2.9* 11/14/2013 1040   CL 101 07/07/2014 0500   CO2 31 07/07/2014 0500   CO2 37* 11/14/2013 1040   GLUCOSE 95 07/07/2014 0500   GLUCOSE 130 11/14/2013 1040   BUN 16 07/07/2014 0500   BUN 10.8 11/14/2013 1040   CREATININE 1.28* 07/07/2014 0500   CREATININE 0.9 11/14/2013 1040   CALCIUM 9.7 07/07/2014 0500   CALCIUM 9.0 11/14/2013 1040   PROT 7.1 07/07/2014 0500   PROT 7.0 11/14/2013 1040   ALBUMIN 3.1* 07/07/2014 0500   ALBUMIN 2.4* 11/14/2013 1040   AST 25 07/07/2014 0500   AST 17 11/14/2013 1040   ALT 34 07/07/2014 0500   ALT 18 11/14/2013 1040   ALKPHOS 212* 07/07/2014 0500   ALKPHOS 133 11/14/2013 1040    BILITOT 0.4 07/07/2014 0500   BILITOT 0.54 11/14/2013 1040   GFRNONAA 43* 07/07/2014 0500   GFRAA 50* 07/07/2014 0500   Lipase     Component Value Date/Time   LIPASE 31 07/06/2014 1828       Studies/Results: Ct Abdomen Pelvis Wo Contrast  07/06/2014   CLINICAL DATA:  Lower abdominal pain and constipation. Status post left nephrectomy and splenectomy 5 weeks ago for renal cell carcinoma. Constipation since the surgery. Lower abdominal pain for the past 2 weeks. The pain is worse today, with nausea and vomiting.  EXAM: CT ABDOMEN AND PELVIS WITHOUT CONTRAST  TECHNIQUE: Multidetector CT imaging of the abdomen and pelvis was performed following the standard protocol without IV contrast.  COMPARISON:  04/10/2014.  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 Interval dilated proximal and mid jejunum with a transition to normal caliber small bowel in the upper pelvis to the left of midline. This appears to be due to an interval internal hernia, best seen on coronal images 43 and 44. The distal small bowel is normal in caliber and not opacified, as is the colon. A large amount of stool was noted in the rectum.  Also  demonstrated is interval soft tissue stranding in the intra-abdominal fat, most pronounced anteriorly on the right at the level of the mid to lower abdomen. Interval midline surgical scar.  Two large gallstones are again demonstrated in the gallbladder. One measures 2.9 cm in maximum diameter and the other measures 2.5 cm in maximum diameter. No gallbladder or wall thickening or pericholecystic fluid is seen.  Unremarkable non contrasted appearance of the liver, pancreas, adrenal glands, right kidney and urinary bladder. Surgically absent uterus. Normal appearing ovaries. Mildly prominent left para-aortic lymph nodes are again demonstrated. The largest has a short axis diameter of 6 mm on image number 39.  Interval development of an 11 mm subpleural nodule in the posterior  aspect of the right lower lobe on image number 12, with an adjacent 5 mm nodule. There is also an interval 6 mm nodule in the right lower lobe laterally on image number 6 and an interval 5 mm nodule in the anterior aspect of the right middle lobe on image 3. There is also a possible developing 3 mm nodule at a vessel branch point in the left lower lobe on image 3. Also noted are coronary artery calcifications.  Thoracolumbar scoliosis and degenerative changes are again demonstrated. 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  IMPRESSION: 1. Interval mid small bowel obstruction due to an internal hernia centered in the midline at the level of the lower abdomen and upper pelvis. 2. Interval soft tissue stranding in the intraperitoneal fat anteriorly, especially on the right. This could be related to the recent surgery or due to peritonitis. 3. Interval development of 4 lung nodules in the right lower lung zone and a possible developing lung nodule in the left lower lobe. These are concerning for interval metastases. 4. Interval splenectomy and left nephrectomy. 5. Stable borderline enlarged left para-aortic lymph nodes. These could be reactive or early metastatic nodes. 6. Stable cholelithiasis without evidence of cholecystitis. 7. Large amount of stool in the rectum. 8. Atheromatous coronary artery calcifications. 9. Bilateral L5 spondylolysis with associated grade 1 spondylolisthesis at the L5-S1 level.   Electronically Signed   By: Claudie Revering M.D.   On: 07/06/2014 20:47   Dg Abd Portable 1v  07/07/2014   CLINICAL DATA:  Nasogastric tube placement  EXAM: PORTABLE ABDOMEN - 1 VIEW  COMPARISON:  CT 07/06/2014  FINDINGS: Nasogastric tube extends into the stomach with tip in the region of the mid gastric body. There is persistent small bowel dilatation in the left abdomen. No free air is evident on this single supine portable radiograph.   IMPRESSION: Nasogastric tube extends into the stomach with tip in the mid gastric body.   Electronically Signed   By: Andreas Newport M.D.   On: 07/07/2014 00:13    Anti-infectives: Anti-infectives    None       Assessment/Plan  1.  SBO, ? Secondary to internal hernia 2. Metastatic renal cell carcinoma with new lung lesions seen on CT scan 3. AKI  Plan: 1. SBO protocol initiated, but will need to review films with surgeon today.  If true internal hernia felt to be the source of her obstruction, not sure conservative management will resolve this.  If she were to need an operation, she really would be best served being transferred back to [facility].  Will make further recommendations after d/w surgeon and follow up films obtained this morning.  LOS: 1 day    Elysa Womac E  07/07/2014, 8:26 AM Pager: [phone]

## 2014-07-08 ENCOUNTER — Encounter: Payer: Self-pay | Admitting: Internal Medicine

## 2014-07-08 ENCOUNTER — Telehealth: Payer: Self-pay | Admitting: Internal Medicine

## 2014-07-08 ENCOUNTER — Inpatient Hospital Stay (HOSPITAL_COMMUNITY): Payer: BLUE CROSS/BLUE SHIELD

## 2014-07-08 LAB — CBC
HCT: 37.9 % (ref 36.0–46.0)
Hemoglobin: 11.9 g/dL — ABNORMAL LOW (ref 12.0–15.0)
MCH: 26 pg (ref 26.0–34.0)
MCHC: 31.4 g/dL (ref 30.0–36.0)
MCV: 82.9 fL (ref 78.0–100.0)
Platelets: 892 10*3/uL — ABNORMAL HIGH (ref 150–400)
RBC: 4.57 MIL/uL (ref 3.87–5.11)
RDW: 16.5 % — AB (ref 11.5–15.5)
WBC: 14.1 10*3/uL — ABNORMAL HIGH (ref 4.0–10.5)

## 2014-07-08 LAB — BASIC METABOLIC PANEL
ANION GAP: 11 (ref 5–15)
BUN: 16 mg/dL (ref 6–23)
CALCIUM: 9.4 mg/dL (ref 8.4–10.5)
CHLORIDE: 101 mmol/L (ref 96–112)
CO2: 31 mmol/L (ref 19–32)
CREATININE: 1.36 mg/dL — AB (ref 0.50–1.10)
GFR calc Af Amer: 47 mL/min — ABNORMAL LOW (ref >90)
GFR calc non Af Amer: 40 mL/min — ABNORMAL LOW (ref >90)
Glucose, Bld: 73 mg/dL (ref 70–99)
Potassium: 4 mmol/L (ref 3.5–5.1)
Sodium: 143 mmol/L (ref 135–145)

## 2014-07-08 MED ORDER — CETYLPYRIDINIUM CHLORIDE 0.05 % MT LIQD: 7.0000 mL | Freq: Two times a day (BID) | OROMUCOSAL | Status: DC

## 2014-07-08 MED ORDER — PHENOL 1.4 % MT LIQD: 1.0000 | OROMUCOSAL | Status: DC | PRN

## 2014-07-08 MED ORDER — METOPROLOL TARTRATE 1 MG/ML IV SOLN: 5.0000 mg | Freq: Two times a day (BID) | INTRAVENOUS | Status: DC

## 2014-07-08 MED ORDER — HYDROMORPHONE HCL 1 MG/ML IJ SOLN: 0.5000 mg | INTRAMUSCULAR | Status: DC | PRN

## 2014-07-08 MED ORDER — BISACODYL 10 MG RE SUPP
10.0000 mg | Freq: Two times a day (BID) | RECTAL | Status: AC
  Administered 2014-07-08 – 2014-07-09 (×3): 10 mg via RECTAL
  Filled 2014-07-08 (×3): qty 1

## 2014-07-08 MED ORDER — BISACODYL 10 MG RE SUPP: 10.0000 mg | Freq: Two times a day (BID) | RECTAL | Status: DC

## 2014-07-08 MED ORDER — CHLORHEXIDINE GLUCONATE 0.12 % MT SOLN: 15.0000 mL | Freq: Two times a day (BID) | OROMUCOSAL | Status: DC

## 2014-07-08 MED ORDER — HYDRALAZINE HCL 20 MG/ML IJ SOLN: 5.0000 mg | INTRAMUSCULAR | Status: DC | PRN

## 2014-07-08 MED ORDER — SODIUM CHLORIDE 0.9 % IV SOLN: 75.0000 mL | INTRAVENOUS | Status: DC

## 2014-07-08 MED ORDER — SODIUM CHLORIDE 0.9 % IV SOLN
INTRAVENOUS | Status: DC
  Administered 2014-07-08 – 2014-07-09 (×3): via INTRAVENOUS

## 2014-07-08 MED ORDER — ACETAMINOPHEN 650 MG RE SUPP
325.0000 mg | RECTAL | Status: DC | PRN
  Administered 2014-07-08 – 2014-07-09 (×2): 325 mg via RECTAL
  Filled 2014-07-08 (×2): qty 1

## 2014-07-08 MED ORDER — ENOXAPARIN SODIUM 40 MG/0.4ML ~~LOC~~ SOLN: 40.0000 mg | SUBCUTANEOUS | Status: DC

## 2014-07-08 MED ORDER — LORAZEPAM 2 MG/ML IJ SOLN: 0.5000 mg | Freq: Four times a day (QID) | INTRAMUSCULAR | Status: DC | PRN

## 2014-07-08 MED ORDER — ONDANSETRON HCL 4 MG/2ML IJ SOLN: 4.0000 mg | Freq: Four times a day (QID) | INTRAMUSCULAR | Status: DC | PRN

## 2014-07-08 NOTE — Progress Notes (Signed)
Patient ID: Kendra Mcfarland, female   DOB: 04-28-1951, 64 y.o.   MRN: [id number]    Subjective: No flatus or BM. No real abdominal pain  Objective: Vital signs in last 24 hours: Temp:  [98.1 F (36.7 C)-98.7 F (37.1 C)] 98.2 F (36.8 C) (02/22 0601) Pulse Rate:  [98-111] 111 (02/22 0601) Resp:  [17-18] 18 (02/22 0601) BP: (111-142)/(44-58) 142/44 mmHg (02/22 0601) SpO2:  [93 %-95 %] 93 % (02/22 0601) Last BM Date: 07/03/14  Intake/Output from previous day: 02/21 0701 - 02/22 0700 In: 0  Out: 1675 [Emesis/NG output:1675] Intake/Output this shift: Total I/O In: -  Out: 100 [Emesis/NG output:100]  PE: Abd: soft, really NT, ND, hypoactive BS, NGT with some bilious output, all incisions are well healed  Lab Results:   Recent Labs  07/07/14 0500 07/08/14 0507  WBC 13.9* 14.1*  HGB 12.9 11.9*  HCT 40.7 37.9  PLT 850* 892*   BMET  Recent Labs  07/07/14 0500 07/08/14 0507  NA 141 143  K 4.3 4.0  CL 101 101  CO2 31 31  GLUCOSE 95 73  BUN 16 16  CREATININE 1.28* 1.36*  CALCIUM 9.7 9.4   PT/INR  Recent Labs  07/07/14 0500  LABPROT 13.9  INR 1.06   CMP     Component Value Date/Time   NA 143 07/08/2014 0507   NA 143 11/14/2013 1040   K 4.0 07/08/2014 0507   K 2.9* 11/14/2013 1040   CL 101 07/08/2014 0507   CO2 31 07/08/2014 0507   CO2 37* 11/14/2013 1040   GLUCOSE 73 07/08/2014 0507   GLUCOSE 130 11/14/2013 1040   BUN 16 07/08/2014 0507   BUN 10.8 11/14/2013 1040   CREATININE 1.36* 07/08/2014 0507   CREATININE 0.9 11/14/2013 1040   CALCIUM 9.4 07/08/2014 0507   CALCIUM 9.0 11/14/2013 1040   PROT 7.1 07/07/2014 0500   PROT 7.0 11/14/2013 1040   ALBUMIN 3.1* 07/07/2014 0500   ALBUMIN 2.4* 11/14/2013 1040   AST 25 07/07/2014 0500   AST 17 11/14/2013 1040   ALT 34 07/07/2014 0500   ALT 18 11/14/2013 1040   ALKPHOS 212* 07/07/2014 0500   ALKPHOS 133 11/14/2013 1040   BILITOT 0.4 07/07/2014 0500   BILITOT 0.54 11/14/2013 1040   GFRNONAA 40*  07/08/2014 0507   GFRAA 47* 07/08/2014 0507   Lipase     Component Value Date/Time   LIPASE 31 07/06/2014 1828       Studies/Results: Ct Abdomen Pelvis Wo Contrast  07/06/2014   CLINICAL DATA:  Lower abdominal pain and constipation. Status post left nephrectomy and splenectomy 5 weeks ago for renal cell carcinoma. Constipation since the surgery. Lower abdominal pain for the past 2 weeks. The pain is worse today, with nausea and vomiting.  EXAM: CT ABDOMEN AND PELVIS WITHOUT CONTRAST  TECHNIQUE: Multidetector CT imaging of the abdomen and pelvis was performed following the standard protocol without IV contrast.  COMPARISON:  04/10/2014.  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 Interval dilated proximal and mid jejunum with a transition to normal caliber small bowel in the upper pelvis to the left of midline. This appears to be due to an interval internal hernia, best seen on coronal images 43 and 44. The distal small bowel is normal in caliber and not opacified, as is the colon. A large amount of stool was noted in the rectum.  Also demonstrated is interval soft tissue stranding in the intra-abdominal fat, most pronounced anteriorly on  the right at the level of the mid to lower abdomen. Interval midline surgical scar.  Two large gallstones are again demonstrated in the gallbladder. One measures 2.9 cm in maximum diameter and the other measures 2.5 cm in maximum diameter. No gallbladder or wall thickening or pericholecystic fluid is seen.  Unremarkable non contrasted appearance of the liver, pancreas, adrenal glands, right kidney and urinary bladder. Surgically absent uterus. Normal appearing ovaries. Mildly prominent left para-aortic lymph nodes are again demonstrated. The largest has a short axis diameter of 6 mm on image number 39.  Interval development of an 11 mm subpleural nodule in the posterior aspect of the right lower lobe on image number 12, with an adjacent 5 mm  nodule. There is also an interval 6 mm nodule in the right lower lobe laterally on image number 6 and an interval 5 mm nodule in the anterior aspect of the right middle lobe on image 3. There is also a possible developing 3 mm nodule at a vessel branch point in the left lower lobe on image 3. Also noted are coronary artery calcifications.  Thoracolumbar scoliosis and degenerative changes are again demonstrated. 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  IMPRESSION: 1. Interval mid small bowel obstruction due to an internal hernia centered in the midline at the level of the lower abdomen and upper pelvis. 2. Interval soft tissue stranding in the intraperitoneal fat anteriorly, especially on the right. This could be related to the recent surgery or due to peritonitis. 3. Interval development of 4 lung nodules in the right lower lung zone and a possible developing lung nodule in the left lower lobe. These are concerning for interval metastases. 4. Interval splenectomy and left nephrectomy. 5. Stable borderline enlarged left para-aortic lymph nodes. These could be reactive or early metastatic nodes. 6. Stable cholelithiasis without evidence of cholecystitis. 7. Large amount of stool in the rectum. 8. Atheromatous coronary artery calcifications. 9. Bilateral L5 spondylolysis with associated grade 1 spondylolisthesis at the L5-S1 level.   Electronically Signed   By: Claudie Revering M.D.   On: 07/06/2014 20:47   Dg Abd 2 Views  07/08/2014   CLINICAL DATA:  Small bowel obstruction, postoperative adhesions  EXAM: ABDOMEN - 2 VIEW  COMPARISON:  07/07/2014  FINDINGS: Nasogastric tube projects over proximal stomach.  Small mild residual contrast in RIGHT colon.  Prominent stool in rectum.  Slightly dcreased small bowel distention since the previous exam though a single loop of small bowel in the mid abdomen remains mildly dilated.  No bowel wall  thickening or free intraperitoneal air.  Bones demineralized with dextro convex thoracolumbar scoliosis and degenerative changes.  No definite urinary tract calcification.  IMPRESSION: Slightly decreased small bowel distention.   Electronically Signed   By: Lavonia Dana M.D.   On: 07/08/2014 08:09   Dg Abd Portable 1v  07/07/2014   CLINICAL DATA:  Five weeks post laparoscopic LEFT nephrectomy and splenectomy for metastatic renal cell carcinoma, only a few bowel movements over past several weeks, abdominal distention and tenderness, crampy pain, vomited copious amounts of green liquid in emergency room question small bowel obstruction  EXAM: PORTABLE ABDOMEN - 1 VIEW  COMPARISON:  Abdominal radiograph and CT abdomen of 07/06/2014  FINDINGS: Increased stool in rectum.  Tip of nasogastric tube projects over stomach.  Single dilated small bowel loop in the LEFT mid abdomen.  Gas and stool in RIGHT colon, small amount.  Visualized dilated  small bowel loop is similar to that seen on the previous exam consistent with small bowel obstruction.  Minimal retained contrast in stomach prior CT.  No free intraperitoneal air.  Osseous demineralization with dextro convex lumbar scoliosis and scattered degenerative disc/facet disease changes of the lumbar spine.  IMPRESSION: Persistent dilatation of a small bowel loop in the mid abdomen, corresponding to small bowel obstruction identified on recent CT.  No significant interval change in appearance since 07/06/2014 exam.   Electronically Signed   By: Lavonia Dana M.D.   On: 07/07/2014 10:31   Dg Abd Portable 1v  07/07/2014   CLINICAL DATA:  Nasogastric tube placement  EXAM: PORTABLE ABDOMEN - 1 VIEW  COMPARISON:  CT 07/06/2014  FINDINGS: Nasogastric tube extends into the stomach with tip in the region of the mid gastric body. There is persistent small bowel dilatation in the left abdomen. No free air is evident on this single supine portable radiograph.  IMPRESSION: Nasogastric  tube extends into the stomach with tip in the mid gastric body.   Electronically Signed   By: Andreas Newport M.D.   On: 07/07/2014 00:13    Anti-infectives: Anti-infectives    None       Assessment/Plan   1. SBO, ? Secondary to internal hernia 2. Metastatic renal cell carcinoma with new lung lesions seen on CT scan, s/p recent nephrectomy and splenectomy at [facility] 4 weeks ago 3. AKI 4. Leukocytosis  Plan: 1. Films today show some contrast in the right colon, but she has no passed any flatus or stool.  Still has some bowel dilatation on her films.  Agree with suppository as she is constipated on her scan.  She clearly does not have a complete obstruction given the contrast in her colon, but still may have a partial obstruction remaining.  After d/w Dr. Hulen Skains, his preference, given how close she is from her recent surgery at [facility], is to transfer her back to [facility] under her primary surgeon's care.  This is Dr. Frances Furbish. I have discussed this with Dr. Waldron Labs. 2. Cont NGT for now.  She does not have any abdominal pain, so I do not think there is a concern for true internal hernia with bowel compromise as possibly suggested on CT scan.  Continue conservative management.  LOS: 2 days    Keenon Leitzel E 07/08/2014, 11:02 AM Pager: [phone]

## 2014-07-08 NOTE — Telephone Encounter (Signed)
[facility] Night - Client TELEPHONE [facility] Patient Name: Kendra Mcfarland Gender: Female DOB: 01/15/1951 Age: 64 Y 81 M 22 D Return Phone Number: [phone] (Primary), [phone] (Secondary) Address: [address] City/State/Zip: [place] [address] Client [name] Night - Client Client Site [facility] - Night Physician Shanon Ace Contact Type Call Call Type Triage / Clinical Caller Name Riddhi Grether Relationship To Patient Spouse Return Phone Number [phone] (Primary) Chief Complaint Abdominal Pain Initial Comment Caller states his wife is having constipation, and abdominal pain. She had surgery about a month ago spleen and kidney removed, she had cancer in her kidney, and blood clots in her spleen. PreDisposition Home Care Nurse Assessment Nurse: Mancel Bale, RN, Arita Miss Date/Time Eilene Ghazi Time): 07/06/2014 4:50:35 PM Confirm and document reason for call. If symptomatic, describe symptoms. ---Caller states his wife is having constipation, and abdominal pain that has been going on for the last 4 days, but is worse today. Last [facility] Wednesday. She had surgery about a month ago spleen and kidney removed, she had cancer in her kidney, and blood clots in her spleen. Has the patient traveled out of the country within the last 30 days? ---No Does the patient require triage? ---Yes Related visit to physician within the last 2 weeks? ---Yes Does the PT have any chronic conditions? (i.e. diabetes, asthma, etc.) ---Yes List chronic conditions. ---hypertension, hyperlipidemia Guidelines Guideline Title Affirmed Question Affirmed Notes Nurse Date/Time Eilene Ghazi Time) Abdominal Pain - Upper [1] SEVERE pain (e.g., excruciating) AND [2] present > 1 hour 8/10 Lennette Bihari, Arita Miss 07/06/2014 4:54:55 PM Disp. Time Eilene Ghazi Time) Disposition Final User 07/06/2014 4:58:30 PM Go to ED Now  Yes Mancel Bale, RN, Cam Hai Understands:

## 2014-07-08 NOTE — Telephone Encounter (Signed)
Pt seen in ED 

## 2014-07-08 NOTE — Progress Notes (Signed)
Pt has been ambulating through the halls prior to pulse collection

## 2014-07-08 NOTE — Discharge Summary (Signed)
Kendra Mcfarland, is a 64 y.o. female  DOB February 05, 1951  MRN [id number].  Admission date:  07/06/2014  Admitting Physician  Sid Falcon, MD  Discharge Date:  07/08/2014   Primary MD  Lottie Dawson, MD  Accepting facility physician please follow on: - Patient with small bowel obstruction secondary to internal hernia, evidence of adhesions on CT abdomen and pelvis, agent is nothing by mouth and on NG tube I LS. - Interval development of 4 lung nodules in the right lower lung zone and a possible developing lung nodule in the left lower lobe.These are concerning for interval metastases.    Admission Diagnosis  SBO (small bowel obstruction) [K56.69] Constipation [K59.00] Intestinal obstruction, unspecified intestinal obstruction type [K56.60]   Discharge Diagnosis  SBO (small bowel obstruction) [K56.69] Constipation [K59.00] Intestinal obstruction, unspecified intestinal obstruction type [K56.60]    Active Problems:   Hyperlipidemia   Essential hypertension   Degenerative joint disease of low back   Metastatic renal cell carcinoma   PRES (posterior reversible encephalopathy syndrome)   Thrombocytosis   S/p nephrectomy   S/P splenectomy   Bowel obstruction      Past Medical History  Diagnosis Date  . Anemia     years ago  . Transfusion history     before her hysterectomy  . Anxiety   . Hyperlipidemia   . Hypertension   . Spinal stenosis   . Spondylosis     degenerative adult  . Bulging lumbar disc   . MONONUCLEOSIS 08/14/2007    Qualifier: Diagnosis of  Problem Stop Reason: Removed By: Hulan Saas, CMA (AAMA), [place]   . Vertigo   . Iron disorder     excessive iron storage   . Cancer     Past Surgical History  Procedure Laterality Date  . Inguinal hernia repair      at birth  . Cesarean  section      x 2  . Appendectomy    . Abdominal hysterectomy      bleeding fibroid tumors has ovaries  . Vein protrusion in head as a child    . Blood transfusion from vaginal bleeding         History of present illness and  Hospital Course:     Kindly see H&P for history of present illness and admission details, please review complete Labs, Consult reports and Test reports for all details in brief  HPI  from the history and physical done on the day of admission  Kendra Mcfarland is a 64yo woman with PMH of metastatic renal cell carcinoma on the left s/p nephrectomy and splenectomy on 05/31/14, PRES, anemia, HLD, HTN, spinal stenosis who presents for constipation and abdominal pain.Resents with complaints of abdominal pain, nausea and vomiting, of green bilious material, reports since surgery had poor bowel movement.  Kendra Mcfarland was recently admitted on 1/23 to this hospital and was diagnosed with PRES. She was treated with BP lowering medications and her symptoms of confusion and difficulty seeing having improved. She continues to  have difficulty differentiating between some numbers and letters when she reads, but otherwise is doing well.   In the ED, an NGT was placed to lower IMWS and she had a CT of her abdomen/pelvis. The CT scan showed SBO due to internal herniation, soft tissue stranding due to surgery or peritonitis, large amount of stool in the rectum. As well did show evidence of new pulmonary nodules not present on previous scan.  Hospital Course   Small Bowel obstruction due to internal hernia - on NGT to LIM wall suction - Nausea medication and pain medication as needed (zofran and dilaudid) - NPO - IVF  - CT scan showed possible peritonitis, she does have an elevated WBC. If fever, would start cipro/flagyl (remains a febrile) - Abdomen x-ray 2/22 showing slightly decreased small bowel distention, with a small mild residual contrast in the right colon/  Metastatic  renal cell carcinoma s/p nephrectomy and splenectomy - CT scan showed possible new metastases to lungs. - Following at [facility]  PRES (posterior reversible encephalopathy syndrome) - Concerning issue as she will not be able take oral medications - IV metoprolol BID ordered with PRN IV hydralazine - If BP not well controlled, will consider consider IV drip of labetalol while NPO vs. Clonidine patch ( so far acceptable)   Hyperlipidemia - Holding anti-hyperlipidemics, consider restarting once she is able to tolerate PO   Essential hypertension - See PRES above, holding PO medications   Degenerative joint disease of low back - Pain control as noted above   Thrombocytosis - Plt's are around 800 here, monitor, possibly reactive to her cancer   CKD - Cr 1.36. Appears to be within her baseline, continue to monitor.   Constipation - Patient was started on Dulcolax suppository and given soapsuds enema this a.m. with large bowel movement.    Discharge Condition: Critical   Follow UP      Discharge Instructions  and  Discharge Medications         Discharge Instructions    Discharge instructions    Complete by:  As directed   Patient is nothing by mouth, with intermittent low suction.            Medication List    STOP taking these medications        acetaminophen 325 MG tablet  Commonly known as:  TYLENOL     amLODipine 10 MG tablet  Commonly known as:  NORVASC     bisacodyl 5 MG EC tablet  Commonly known as:  DULCOLAX  Replaced by:  bisacodyl 10 MG suppository     carvedilol 3.125 MG tablet  Commonly known as:  COREG     cholecalciferol 1000 UNITS tablet  Commonly known as:  VITAMIN D     clonazePAM 0.5 MG tablet  Commonly known as:  KLONOPIN     docusate sodium 100 MG capsule  Commonly known as:  COLACE     esomeprazole 40 MG capsule  Commonly known as:  NEXIUM     multivitamin with minerals Tabs tablet     psyllium 58.6 % packet    Commonly known as:  METAMUCIL     rosuvastatin 10 MG tablet  Commonly known as:  CRESTOR     senna 8.6 MG Tabs tablet  Commonly known as:  SENOKOT     Vitamin B-12 5000 MCG Subl      TAKE these medications        antiseptic oral rinse 0.05 % Liqd solution  Commonly  known as:  CPC / CETYLPYRIDINIUM CHLORIDE 0.05%  7 mLs by Mouth Rinse route 2 times daily at 12 noon and 4 pm.     bisacodyl 10 MG suppository  Commonly known as:  DULCOLAX  Place 1 suppository (10 mg total) rectally 2 (two) times daily.     chlorhexidine 0.12 % solution  Commonly known as:  PERIDEX  15 mLs by Mouth Rinse route 2 (two) times daily.     enoxaparin 40 MG/0.4ML injection  Commonly known as:  LOVENOX  Inject 0.4 mLs (40 mg total) into the skin daily.     hydrALAZINE 20 MG/ML injection  Commonly known as:  APRESOLINE  Inject 0.25 mLs (5 mg total) into the vein every 4 (four) hours as needed (SBP > 160).     HYDROmorphone 1 MG/ML injection  Commonly known as:  DILAUDID  Inject 0.5 mLs (0.5 mg total) into the vein every 2 (two) hours as needed for severe pain (pre treat with Zofran).     LORazepam 2 MG/ML injection  Commonly known as:  ATIVAN  Inject 0.25 mLs (0.5 mg total) into the vein every 6 (six) hours as needed for anxiety.     metoprolol 1 MG/ML injection  Commonly known as:  LOPRESSOR  Inject 5 mLs (5 mg total) into the vein every 12 (twelve) hours.     ondansetron 4 MG/2ML Soln injection  Commonly known as:  ZOFRAN  Inject 2 mLs (4 mg total) into the vein every 6 (six) hours as needed for nausea.     phenol 1.4 % Liqd  Commonly known as:  CHLORASEPTIC  Use as directed 1 spray in the mouth or throat as needed for throat irritation / pain.     sodium chloride 0.9 % infusion  Inject 75 mLs into the vein continuous.          Diet and Activity recommendation: See Discharge Instructions above   Consults obtained -  Gen. surgery   Major procedures and Radiology Reports -  PLEASE review detailed and final reports for all details, in brief -      Ct Abdomen Pelvis Wo Contrast  07/06/2014   CLINICAL DATA:  Lower abdominal pain and constipation. Status post left nephrectomy and splenectomy 5 weeks ago for renal cell carcinoma. Constipation since the surgery. Lower abdominal pain for the past 2 weeks. The pain is worse today, with nausea and vomiting.  EXAM: CT ABDOMEN AND PELVIS WITHOUT CONTRAST  TECHNIQUE: Multidetector CT imaging of the abdomen and pelvis was performed following the standard protocol without IV contrast.  COMPARISON:  04/10/2014.  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 Interval dilated proximal and mid jejunum with a transition to normal caliber small bowel in the upper pelvis to the left of midline. This appears to be due to an interval internal hernia, best seen on coronal images 43 and 44. The distal small bowel is normal in caliber and not opacified, as is the colon. A large amount of stool was noted in the rectum.  Also demonstrated is interval soft tissue stranding in the intra-abdominal fat, most pronounced anteriorly on the right at the level of the mid to lower abdomen. Interval midline surgical scar.  Two large gallstones are again demonstrated in the gallbladder. One measures 2.9 cm in maximum diameter and the other measures 2.5 cm in maximum diameter. No gallbladder or wall thickening or pericholecystic fluid is seen.  Unremarkable non contrasted appearance of the liver, pancreas, adrenal glands,  right kidney and urinary bladder. Surgically absent uterus. Normal appearing ovaries. Mildly prominent left para-aortic lymph nodes are again demonstrated. The largest has a short axis diameter of 6 mm on image number 39.  Interval development of an 11 mm subpleural nodule in the posterior aspect of the right lower lobe on image number 12, with an adjacent 5 mm nodule. There is also an interval 6 mm nodule in the right lower lobe  laterally on image number 6 and an interval 5 mm nodule in the anterior aspect of the right middle lobe on image 3. There is also a possible developing 3 mm nodule at a vessel branch point in the left lower lobe on image 3. Also noted are coronary artery calcifications.  Thoracolumbar scoliosis and degenerative changes are again demonstrated. 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  IMPRESSION: 1. Interval mid small bowel obstruction due to an internal hernia centered in the midline at the level of the lower abdomen and upper pelvis. 2. Interval soft tissue stranding in the intraperitoneal fat anteriorly, especially on the right. This could be related to the recent surgery or due to peritonitis. 3. Interval development of 4 lung nodules in the right lower lung zone and a possible developing lung nodule in the left lower lobe. These are concerning for interval metastases. 4. Interval splenectomy and left nephrectomy. 5. Stable borderline enlarged left para-aortic lymph nodes. These could be reactive or early metastatic nodes. 6. Stable cholelithiasis without evidence of cholecystitis. 7. Large amount of stool in the rectum. 8. Atheromatous coronary artery calcifications. 9. Bilateral L5 spondylolysis with associated grade 1 spondylolisthesis at the L5-S1 level.   Electronically Signed   By: Claudie Revering M.D.   On: 07/06/2014 20:47   Dg Chest 2 View  06/19/2014   CLINICAL DATA:  Lower chest pain.  EXAM: CHEST  2 VIEW  COMPARISON:  CT 04/10/2014.  FINDINGS: Mediastinum and hilar structures are normal. Bibasilar subsegmental atelectasis. Left lower lobe mild infiltrate with small left pleural effusion noted. No pneumothorax. Heart size normal. No acute or focal bony abnormality.  IMPRESSION: Left lower lobe mild infiltrate with small left pleural effusion.   Electronically Signed   By: Marcello Moores  Register   On: 06/19/2014 16:53   Ct Head Wo  Contrast  06/09/2014   CLINICAL DATA:  Nausea and vomiting. Slurred speech. Confusion. Urinary tract infection. Nystagmus. Prior left nephrectomy and splenectomy 9 days ago at [facility] for renal cell carcinoma.  EXAM: CT HEAD WITHOUT CONTRAST  TECHNIQUE: Contiguous axial images were obtained from the base of the skull through the vertex without intravenous contrast.  COMPARISON:  None  FINDINGS: The cerebellum and brainstem appear unremarkable as do the thalami, basal ganglia, and ventricular system.  There is abnormal vasogenic edema posteriorly in the periventricular white matter and corona radiata. Possible 1.3 cm mass in the right parietal lobe, image 19 series 2. No overt intracranial hyperdensity to favor intracranial hemorrhage. No destructive calvarial lesions identified.  IMPRESSION: 1. Considerably abnormal intracranial appearance with scattered abnormal white matter hypodensities in the periventricular white matter, corona radiata, and occipital and parietal lobes, with a possible mass lesion in the right parietal lobe. 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 Bilateral encephalitis or white matter infarcts not excluded. MRI brain with without contrast recommended (reduced dose of contrast may be warranted due to renal insufficiency).   Electronically Signed   By: Sherryl Barters M.D.   On:  06/09/2014 16:54   Mr Jodene Nam Head Wo Contrast  06/10/2014   CLINICAL DATA:  Posterior reversible encephalopathy syndrome. Confusion and headache. Blurred vision. Hypertension.  EXAM: MRA HEAD WITHOUT CONTRAST  TECHNIQUE: Angiographic images of the Circle of Willis were obtained using MRA technique without intravenous contrast.  COMPARISON:  Head MRI 06/10/2014  FINDINGS: Images are mildly to moderate degraded by motion artifact.  Visualized distal vertebral arteries are patent with the right being mildly dominant. Left vertebral artery is small distal to the PICA origin, possibly with superimposed mild  stenosis. Left PICA origin is patent. Right PICA is not clearly identified. SCA origins are patent. Basilar artery is patent without stenosis. PCAs are patent without proximal stenosis. Mild PCA branch vessel irregularity may be artifactual due to motion or reflect underlying atherosclerosis. Posterior communicating arteries are not clearly identified.  Internal carotid arteries are patent from skullbase to carotid termini without evidence of significant stenosis allowing for motion artifact. M1 segments are patent without stenosis. There is mild left greater than right MCA branch vessel irregularity. Right A1 segment is hypoplastic. Left A1 segment is dominant without stenosis and provides the dominant supply to the right A2 segment via a patent anterior communicating artery. No intracranial aneurysm is identified.  IMPRESSION: Motion degraded examination. No evidence of major intracranial arterial occlusion or significant proximal stenosis.   Electronically Signed   By: Logan Bores   On: 06/10/2014 12:56   Mr Jeri Cos Wo Contrast  06/10/2014   CLINICAL DATA:  64 year old female with history of high blood pressure and hyperlipidemia status post nephrectomy and splenectomy 05/31/2014 for renal cell carcinoma. Yesterday developed visual difficulty, blurred vision and headache becoming progressively worse leading to confused and disoriented state. No fever. Mild left arm weakness, left neglect and left gaze preference upon neurological exam. Abnormal CT. Subsequent encounter.  EXAM: MRI HEAD WITHOUT AND WITH CONTRAST  TECHNIQUE: Multiplanar, multiecho pulse sequences of the brain and surrounding structures were obtained without and with intravenous contrast.  CONTRAST:  69mL MULTIHANCE GADOBENATE DIMEGLUMINE 529 MG/ML IV SOLN  COMPARISON:  06/09/2014 head CT.  No comparison brain MR.  FINDINGS: Abnormal appearance of the occipital lobes, portions of the parietal lobes and medial aspect the posterior frontal lobes.  Findings more notable involving the left occipital lobe. Scattered patchy enhancement medial aspect left posterior frontal lobe, parietal lobe, occipital lobe and portion of the splenium of the left aspect of the corpus callosum. There may be a minimal petechial hemorrhage in the left occipital lobe. Despite the fact that there may be small areas of restricted motion, the symmetric appearance and distribution is most suggestive of posterior reversible encephalopathy syndrome. No evidence of large dural sinus thrombosis to suggest changes are related to venous thrombosis. Arterial distribution infarcts felt to be a much less likely consideration.  No abnormal enhancing lesion in the right parietal lobe as questioned on CT. Metastatic disease as cause for the above described findings is felt to be a much less likely possibility.  As the above described findings should be transient, one can confirm clearance on follow-up MR imaging in 3-4 weeks.  On FLAIR imaging increased signal is noted within the left middle cerebral artery branches. This may indicate proximal stenosis. There is a flow void indicating patency of the major intracranial vascular structures however, when the patient has follow-up MR, MR angiography can be performed to exclude M1 stenosis which may not be appreciated on the present exam.  No hydrocephalus.  Cervical medullary junction, pituitary  region, pineal region and orbital structures unremarkable.  IMPRESSION: Abnormal examination with findings most suggestive of posterior reversible encephalopathy syndrome more notable on the left where there are areas of patchy enhancement and possibly petechial hemorrhage. To confirm this diagnosis, one could obtain follow-up MR in 3-4 weeks (sooner if clinically indicated) as findings should be transient. Arterial thrombosis, metastatic disease or infectious encephalopathy felt to be much less likely considerations.  Question abnormal flow within left middle  cerebral artery branches. On followup MR, MR angiogram recommended to help exclude proximal stenosis not appreciated on the current exam.   Electronically Signed   By: Chauncey Cruel M.D.   On: 06/10/2014 07:44   Dg Abd 2 Views  07/08/2014   CLINICAL DATA:  Small bowel obstruction, postoperative adhesions  EXAM: ABDOMEN - 2 VIEW  COMPARISON:  07/07/2014  FINDINGS: Nasogastric tube projects over proximal stomach.  Small mild residual contrast in RIGHT colon.  Prominent stool in rectum.  Slightly dcreased small bowel distention since the previous exam though a single loop of small bowel in the mid abdomen remains mildly dilated.  No bowel wall thickening or free intraperitoneal air.  Bones demineralized with dextro convex thoracolumbar scoliosis and degenerative changes.  No definite urinary tract calcification.  IMPRESSION: Slightly decreased small bowel distention.   Electronically Signed   By: Lavonia Dana M.D.   On: 07/08/2014 08:09   Dg Abd Portable 1v  07/07/2014   CLINICAL DATA:  Five weeks post laparoscopic LEFT nephrectomy and splenectomy for metastatic renal cell carcinoma, only a few bowel movements over past several weeks, abdominal distention and tenderness, crampy pain, vomited copious amounts of green liquid in emergency room question small bowel obstruction  EXAM: PORTABLE ABDOMEN - 1 VIEW  COMPARISON:  Abdominal radiograph and CT abdomen of 07/06/2014  FINDINGS: Increased stool in rectum.  Tip of nasogastric tube projects over stomach.  Single dilated small bowel loop in the LEFT mid abdomen.  Gas and stool in RIGHT colon, small amount.  Visualized dilated small bowel loop is similar to that seen on the previous exam consistent with small bowel obstruction.  Minimal retained contrast in stomach prior CT.  No free intraperitoneal air.  Osseous demineralization with dextro convex lumbar scoliosis and scattered degenerative disc/facet disease changes of the lumbar spine.  IMPRESSION: Persistent  dilatation of a small bowel loop in the mid abdomen, corresponding to small bowel obstruction identified on recent CT.  No significant interval change in appearance since 07/06/2014 exam.   Electronically Signed   By: Lavonia Dana M.D.   On: 07/07/2014 10:31   Dg Abd Portable 1v  07/07/2014   CLINICAL DATA:  Nasogastric tube placement  EXAM: PORTABLE ABDOMEN - 1 VIEW  COMPARISON:  CT 07/06/2014  FINDINGS: Nasogastric tube extends into the stomach with tip in the region of the mid gastric body. There is persistent small bowel dilatation in the left abdomen. No free air is evident on this single supine portable radiograph.  IMPRESSION: Nasogastric tube extends into the stomach with tip in the mid gastric body.   Electronically Signed   By: Andreas Newport M.D.   On: 07/07/2014 00:13    Micro Results    No results found for this or any previous visit (from the past 240 hour(s)).     Today   Subjective:   Kendra Mcfarland today has no headache,no chest abdominal pain,no new weakness tingling or numbness,   Objective:   Blood pressure 142/44, pulse 111, temperature 98.2 F (36.8  C), temperature source Oral, resp. rate 18, height 4\' 11"  (1.499 m), weight 62.914 kg (138 lb 11.2 oz), SpO2 93 %.   Intake/Output Summary (Last 24 hours) at 07/08/14 1548 Last data filed at 07/08/14 8250  Gross per 24 hour  Intake      0 ml  Output   1775 ml  Net  -1775 ml    Exam Awake Alert, Oriented x 3, No new F.N deficits, Normal affect [place].AT,PERRAL Supple Neck,No JVD, No cervical lymphadenopathy appriciated.  Symmetrical Chest wall movement, Good air movement bilaterally, CTAB RRR,No Gallops,Rubs or new Murmurs, No Parasternal Heave +ve B.Sounds, Abd Soft, Non tender, No organomegaly appriciated, No rebound -guarding or rigidity. No Cyanosis, Clubbing or edema, No new Rash or bruise  Data Review   CBC w Diff:  Lab Results  Component Value Date   WBC 14.1* 07/08/2014   WBC 10.4* 03/18/2014    HGB 11.9* 07/08/2014   HGB 9.8* 03/18/2014   HCT 37.9 07/08/2014   HCT 31.8* 03/18/2014   PLT 892* 07/08/2014   PLT 672* 03/18/2014   LYMPHOPCT 25 06/14/2014   LYMPHOPCT 18.4 03/18/2014   MONOPCT 11 06/14/2014   MONOPCT 9.9 03/18/2014   EOSPCT 6* 06/14/2014   EOSPCT 1.8 03/18/2014   BASOPCT 0 06/14/2014   BASOPCT 1.3 03/18/2014    CMP:  Lab Results  Component Value Date   NA 143 07/08/2014   NA 143 11/14/2013   K 4.0 07/08/2014   K 2.9* 11/14/2013   CL 101 07/08/2014   CO2 31 07/08/2014   CO2 37* 11/14/2013   BUN 16 07/08/2014   BUN 10.8 11/14/2013   CREATININE 1.36* 07/08/2014   CREATININE 0.9 11/14/2013   PROT 7.1 07/07/2014   PROT 7.0 11/14/2013   ALBUMIN 3.1* 07/07/2014   ALBUMIN 2.4* 11/14/2013   BILITOT 0.4 07/07/2014   BILITOT 0.54 11/14/2013   ALKPHOS 212* 07/07/2014   ALKPHOS 133 11/14/2013   AST 25 07/07/2014   AST 17 11/14/2013   ALT 34 07/07/2014   ALT 18 11/14/2013  .   Total Time in preparing paper work, data evaluation and todays exam - 35 minutes  ELGERGAWY, DAWOOD M.D on 07/08/2014 at 3:48 PM  [facility] Office  [phone]

## 2014-07-09 ENCOUNTER — Inpatient Hospital Stay (HOSPITAL_COMMUNITY): Payer: BLUE CROSS/BLUE SHIELD

## 2014-07-09 DIAGNOSIS — C7931 Secondary malignant neoplasm of brain: Secondary | ICD-10-CM | POA: Diagnosis not present

## 2014-07-09 LAB — CBC
HEMATOCRIT: 38.9 % (ref 36.0–46.0)
HEMOGLOBIN: 11.9 g/dL — AB (ref 12.0–15.0)
MCH: 25.6 pg — ABNORMAL LOW (ref 26.0–34.0)
MCHC: 30.6 g/dL (ref 30.0–36.0)
MCV: 83.7 fL (ref 78.0–100.0)
Platelets: 894 10*3/uL — ABNORMAL HIGH (ref 150–400)
RBC: 4.65 MIL/uL (ref 3.87–5.11)
RDW: 16.4 % — ABNORMAL HIGH (ref 11.5–15.5)
WBC: 14.5 10*3/uL — AB (ref 4.0–10.5)

## 2014-07-09 LAB — BASIC METABOLIC PANEL
Anion gap: 11 (ref 5–15)
BUN: 18 mg/dL (ref 6–23)
CO2: 29 mmol/L (ref 19–32)
Calcium: 9.3 mg/dL (ref 8.4–10.5)
Chloride: 103 mmol/L (ref 96–112)
Creatinine, Ser: 1.34 mg/dL — ABNORMAL HIGH (ref 0.50–1.10)
GFR calc Af Amer: 47 mL/min — ABNORMAL LOW (ref >90)
GFR, EST NON AFRICAN AMERICAN: 41 mL/min — AB (ref >90)
GLUCOSE: 80 mg/dL (ref 70–99)
Potassium: 4.6 mmol/L (ref 3.5–5.1)
SODIUM: 143 mmol/L (ref 135–145)

## 2014-07-09 LAB — GLUCOSE, CAPILLARY
GLUCOSE-CAPILLARY: 61 mg/dL — AB (ref 70–99)
Glucose-Capillary: 123 mg/dL — ABNORMAL HIGH (ref 70–99)
Glucose-Capillary: 65 mg/dL — ABNORMAL LOW (ref 70–99)

## 2014-07-09 MED ORDER — DEXTROSE 50 % IV SOLN
INTRAVENOUS | Status: AC
  Administered 2014-07-10: 25 mL via INTRAVENOUS
  Filled 2014-07-09: qty 50

## 2014-07-09 MED ORDER — DEXTROSE 50 % IV SOLN
INTRAVENOUS | Status: AC
  Administered 2014-07-09: 25 mL
  Filled 2014-07-09: qty 50

## 2014-07-09 MED ORDER — DEXTROSE 50 % IV SOLN
25.0000 mL | Freq: Once | INTRAVENOUS | Status: AC
Start: 2014-07-09 — End: 2014-07-10
  Administered 2014-07-10 (×2): 25 mL via INTRAVENOUS

## 2014-07-09 NOTE — Progress Notes (Signed)
CRITICAL VALUE ALERT  Critical value received:  CBG 61  Date of notification:  07/09/14  Time of notification:  2015  Critical value read back:yes  Nurse who received alert:  Tor Netters, RN  MD notified (1st page):  no  Time of first page:  n/a  MD notified (2nd page):  Time of second page:  Responding MD:  no  Time MD responded:  n/a

## 2014-07-09 NOTE — Progress Notes (Signed)
Hypoglycemic Event  CBG: 61  Treatment: 1/2 D50  Symptoms: none  Follow-up CBG: Time:2050 CBG Result:123  Possible Reasons for Event: NPO status  Comments/MD notified: no    Kendra Mcfarland, Kendra Mcfarland  Remember to initiate Hypoglycemia Order Set & complete

## 2014-07-09 NOTE — Progress Notes (Signed)
RN received a call from [place] [facility] that no bed available yet for pt as of the time of this conversation. Will continue to follow through with the day RN. FYI.

## 2014-07-09 NOTE — Progress Notes (Signed)
Patient ID: Kendra Mcfarland, female   DOB: [birth year], 64 y.o.   MRN: [id number]    Subjective: Pt feels better today.  Had 3 BMs yesterday with the assistance of suppository and enema.  She has passed flatus today.  Objective: Vital signs in last 24 hours: Temp:  [98.2 F (36.8 C)-98.7 F (37.1 C)] 98.7 F (37.1 C) (02/23 0601) Pulse Rate:  [103-135] 103 (02/23 0601) Resp:  [18-19] 19 (02/23 0601) BP: (110-140)/(56-63) 125/56 mmHg (02/23 0601) SpO2:  [95 %-98 %] 97 % (02/23 0601) Last BM Date: August 05, 2014  Intake/Output from previous day: 08-05-22 0701 - 02/23 0700 In: -  Out: 100 [Emesis/NG output:100] Intake/Output this shift:    PE: Abd: soft, more bowel sounds today, NGT with clear output today, ND, NT  Lab Results:   Recent Labs  07/07/14 0500 08/05/2014 0507  WBC 13.9* 14.1*  HGB 12.9 11.9*  HCT 40.7 37.9  PLT 850* 892*   BMET  Recent Labs  07/07/14 0500 2014-08-05 0507  NA 141 143  K 4.3 4.0  CL 101 101  CO2 31 31  GLUCOSE 95 73  BUN 16 16  CREATININE 1.28* 1.36*  CALCIUM 9.7 9.4   PT/INR  Recent Labs  07/07/14 0500  LABPROT 13.9  INR 1.06   CMP     Component Value Date/Time   NA 143 Aug 05, 2014 0507   NA 143 11/14/2013 1040   K 4.0 Aug 05, 2014 0507   K 2.9* 11/14/2013 1040   CL 101 2014-08-05 0507   CO2 31 05-Aug-2014 0507   CO2 37* 11/14/2013 1040   GLUCOSE 73 2014-08-05 0507   GLUCOSE 130 11/14/2013 1040   BUN 16 05-Aug-2014 0507   BUN 10.8 11/14/2013 1040   CREATININE 1.36* 05-Aug-2014 0507   CREATININE 0.9 11/14/2013 1040   CALCIUM 9.4 08/05/2014 0507   CALCIUM 9.0 11/14/2013 1040   PROT 7.1 07/07/2014 0500   PROT 7.0 11/14/2013 1040   ALBUMIN 3.1* 07/07/2014 0500   ALBUMIN 2.4* 11/14/2013 1040   AST 25 07/07/2014 0500   AST 17 11/14/2013 1040   ALT 34 07/07/2014 0500   ALT 18 11/14/2013 1040   ALKPHOS 212* 07/07/2014 0500   ALKPHOS 133 11/14/2013 1040   BILITOT 0.4 07/07/2014 0500   BILITOT 0.54 11/14/2013 1040   GFRNONAA 40*  08-05-2014 0507   GFRAA 47* 08/05/14 0507   Lipase     Component Value Date/Time   LIPASE 31 07/06/2014 1828       Studies/Results: Dg Abd 2 Views  2014/08/05   CLINICAL DATA:  Small bowel obstruction, postoperative adhesions  EXAM: ABDOMEN - 2 VIEW  COMPARISON:  07/07/2014  FINDINGS: Nasogastric tube projects over proximal stomach.  Small mild residual contrast in RIGHT colon.  Prominent stool in rectum.  Slightly dcreased small bowel distention since the previous exam though a single loop of small bowel in the mid abdomen remains mildly dilated.  No bowel wall thickening or free intraperitoneal air.  Bones demineralized with dextro convex thoracolumbar scoliosis and degenerative changes.  No definite urinary tract calcification.  IMPRESSION: Slightly decreased small bowel distention.   Electronically Signed   By: Lavonia Dana M.D.   On: Aug 05, 2014 08:09   Dg Abd Portable 1v  07/07/2014   CLINICAL DATA:  Five weeks post laparoscopic LEFT nephrectomy and splenectomy for metastatic renal cell carcinoma, only a few bowel movements over past several weeks, abdominal distention and tenderness, crampy pain, vomited copious amounts of green liquid in emergency room question small  bowel obstruction  EXAM: PORTABLE ABDOMEN - 1 VIEW  COMPARISON:  Abdominal radiograph and CT abdomen of 07/06/2014  FINDINGS: Increased stool in rectum.  Tip of nasogastric tube projects over stomach.  Single dilated small bowel loop in the LEFT mid abdomen.  Gas and stool in RIGHT colon, small amount.  Visualized dilated small bowel loop is similar to that seen on the previous exam consistent with small bowel obstruction.  Minimal retained contrast in stomach prior CT.  No free intraperitoneal air.  Osseous demineralization with dextro convex lumbar scoliosis and scattered degenerative disc/facet disease changes of the lumbar spine.  IMPRESSION: Persistent dilatation of a small bowel loop in the mid abdomen, corresponding to  small bowel obstruction identified on recent CT.  No significant interval change in appearance since 07/06/2014 exam.   Electronically Signed   By: Lavonia Dana M.D.   On: 07/07/2014 10:31    Anti-infectives: Anti-infectives    None       Assessment/Plan    1. SBO 2. Metastatic renal cell carcinoma with new lung lesions seen on CT scan, s/p recent nephrectomy and splenectomy at [facility] 4 weeks ago 3. AKI 4. Leukocytosis  Plan: 1. Patient seems to be improving today.  We will get plain films today.  She may be able to have her NGT clamped or removed.  Will follow.  LOS: 3 days    Naome Brigandi E 07/09/2014, 9:48 AM Pager: [phone]

## 2014-07-09 NOTE — Progress Notes (Signed)
Patient Demographics  Kendra Mcfarland, is a 64 y.o. female, DOB - 1950/12/19, ONG:[id number]  Admit date - 07/06/2014   Admitting Physician Sid Falcon, MD  Outpatient Primary MD for the patient is Lottie Dawson, MD  LOS - 3   Chief Complaint  Patient presents with  . Constipation      Admission history of present illness/brief narrative: Kendra Mcfarland is a 64yo woman with PMH of metastatic renal cell carcinoma on the left s/p nephrectomy and splenectomy on 05/31/14, PRES, anemia, HLD, HTN, spinal stenosis who presents for constipation and abdominal pain.Resents with complaints of abdominal pain, nausea and vomiting, of green bilious material, reports since surgery had poor bowel movement.  Kendra Mcfarland was recently admitted on 1/23 to this hospital and was diagnosed with PRES. She was treated with BP lowering medications and her symptoms of confusion and difficulty seeing having improved. She continues to have difficulty differentiating between some numbers and letters when she reads, but otherwise is doing well.   In the ED, an NGT was placed to lower IMWS and she had a CT of her abdomen/pelvis. The CT scan showed SBO due to internal herniation, soft tissue stranding due to surgery or peritonitis, large amount of stool in the rectum. As well did show evidence of new pulmonary nodules not present on previous scan.As well did show evidence of new pulmonary nodules not present on previous scan. Patient NG tube  inadvertently pulled out, currently on ice chips, no nausea or vomiting. Assessment and Plan: Subjective:   Kendra Mcfarland today has, No headache, No chest pain,  No new weakness tingling or numbness, No Cough - SOB. No further nausea or abdominal pain  Assessment & Plan    Active Problems:   Hyperlipidemia   Essential hypertension   Degenerative joint  disease of low back   Metastatic renal cell carcinoma   PRES (posterior reversible encephalopathy syndrome)   Thrombocytosis   S/p nephrectomy   S/P splenectomy   Bowel obstruction  Small Bowel obstruction due to internal hernia - General surgery consult appreciated -NG tube output - Nausea medication and pain medication as needed (zofran and dilaudid) -On ice chips - IVF  - CT scan showed possible peritonitis, she does have an elevated WBC. If fever, would start cipro/flagyl (remains a febrile0 -Patient is accepted at [facility] surgical service by Dr. Wynetta Emery , awaiting bed availability .  Metastatic renal cell carcinoma s/p nephrectomy and splenectomy - CT scan showed possible new metastases to lungs.Will need to follow with urology oncology at [facility]. - continue pain control  PRES (posterior reversible encephalopathy syndrome) - Concerning issue as she will not be able take oral medications - IV metoprolol BID ordered with PRN IV hydralazine - If BP not well controlled, will consider consider IV drip of labetalol while NPO vs. Clonidine patch ( so far acceptable)   Hyperlipidemia - Holding anti-hyperlipidemics, consider restarting once she is able to tolerate PO   Essential hypertension - See PRES above, holding PO medications   Degenerative joint disease of low back - Pain control as noted above   Thrombocytosis - Plt's are around 800 here, monitor, possibly reactive to her cancer   CKD - Cr 1.36. Appears to be within her baseline, continue  to monitor.    Code Status: Full  Family Communication:Discussed with husband at bedside  Disposition Plan:For transfer to [facility] surgical service  Procedures  None   Consults   Surgery   Medications  Scheduled Meds: . antiseptic oral rinse  7 mL Mouth Rinse q12n4p  . chlorhexidine  15 mL Mouth Rinse BID  . enoxaparin (LOVENOX) injection  40 mg Subcutaneous Q24H  . metoprolol  5 mg Intravenous Q12H   Continuous  Infusions: . sodium chloride 75 mL/hr at 07/09/14 1647   PRN Meds:.acetaminophen, hydrALAZINE, HYDROmorphone (DILAUDID) injection, LORazepam, ondansetron **OR** ondansetron (ZOFRAN) IV, phenol  DVT Prophylaxis  Lovenox   Lab Results  Component Value Date   PLT 894* 07/09/2014    Antibiotics    Anti-infectives    None          Objective:   Filed Vitals:   07/08/14 1445 07/08/14 2029 07/09/14 0601 07/09/14 1430  BP: 140/63 110/62 125/56 128/63  Pulse: 109 135 103 101  Temp: 98.2 F (36.8 C) 98.2 F (36.8 C) 98.7 F (37.1 C) 97.6 F (36.4 C)  TempSrc: Oral Oral Oral Oral  Resp: 18 19 19 18   Height:      Weight:      SpO2: 95% 98% 97% 96%    Wt Readings from Last 3 Encounters:  07/07/14 62.914 kg (138 lb 11.2 oz)  06/19/14 63.277 kg (139 lb 8 oz)  04/03/14 67.405 kg (148 lb 9.6 oz)     Intake/Output Summary (Last 24 hours) at 07/09/14 1834 Last data filed at 07/09/14 1322  Gross per 24 hour  Intake      0 ml  Output    300 ml  Net   -300 ml     Physical Exam  Awake Alert, Oriented X 3, No new F.N deficits, Normal affect [name],PERRAL Supple Neck,No JVD, No cervical lymphadenopathy appriciated.  Symmetrical Chest wall movement, Good air movement bilaterally, CTAB RRR,No Gallops,Rubs or new Murmurs, No Parasternal Heave Soft, nondistended, bowel sounds present, nontender No Cyanosis, Clubbing or edema, No new Rash or bruise     Data Review   Micro Results No results found for this or any previous visit (from the past 240 hour(s)).  Radiology Reports Dg Abd 2 Views  07/09/2014   CLINICAL DATA:  Left nephrectomy in mid January with onset of constipation thereafter; persistent symptoms  EXAM: ABDOMEN - 2 VIEW  COMPARISON:  Supine and upright abdominal films of July 08, 2014  FINDINGS: The nasogastric tube has been removed. There remain loops of mildly distended gas-filled small bowel in the mid abdomen. There is contrast in the transverse colon and  proximal descending colon which has migrated further distally since yesterday's study. No free extraluminal gas collections are demonstrated.  There is stable dextrocurvature of the mid lumbar spine. The lung bases are clear  IMPRESSION: Bowel gas pattern is consistent with a partial distal small bowel obstruction. There has been some further distal migration of the CT contrast material into the region of the hepatic flexure.   Electronically Signed   By: David  Martinique   On: 07/09/2014 12:57   Dg Abd 2 Views  07/08/2014   CLINICAL DATA:  Small bowel obstruction, postoperative adhesions  EXAM: ABDOMEN - 2 VIEW  COMPARISON:  07/07/2014  FINDINGS: Nasogastric tube projects over proximal stomach.  Small mild residual contrast in RIGHT colon.  Prominent stool in rectum.  Slightly dcreased small bowel distention since the previous exam though a single loop of small  bowel in the mid abdomen remains mildly dilated.  No bowel wall thickening or free intraperitoneal air.  Bones demineralized with dextro convex thoracolumbar scoliosis and degenerative changes.  No definite urinary tract calcification.  IMPRESSION: Slightly decreased small bowel distention.   Electronically Signed   By: Lavonia Dana M.D.   On: 07/08/2014 08:09    CBC  Recent Labs Lab 07/06/14 1828 07/07/14 0500 07/08/14 0507 07/09/14 0929  WBC 14.0* 13.9* 14.1* 14.5*  HGB 12.4 12.9 11.9* 11.9*  HCT 38.6 40.7 37.9 38.9  PLT 814* 850* 892* 894*  MCV 80.9 81.7 82.9 83.7  MCH 26.0 25.9* 26.0 25.6*  MCHC 32.1 31.7 31.4 30.6  RDW 16.3* 16.5* 16.5* 16.4*    Chemistries   Recent Labs Lab 07/06/14 1828 07/07/14 0500 07/08/14 0507 07/09/14 0929  NA 135 141 143 143  K 4.2 4.3 4.0 4.6  CL 99 101 101 103  CO2 25 31 31 29   GLUCOSE 100* 95 73 80  BUN 21 16 16 18   CREATININE 1.36* 1.28* 1.36* 1.34*  CALCIUM 9.4 9.7 9.4 9.3  AST 33 25  --   --   ALT 39* 34  --   --   ALKPHOS 230* 212*  --   --   BILITOT 0.4 0.4  --   --     ------------------------------------------------------------------------------------------------------------------ estimated creatinine clearance is 34.2 mL/min (by C-G formula based on Cr of 1.34). ------------------------------------------------------------------------------------------------------------------ No results for input(s): HGBA1C in the last 72 hours. ------------------------------------------------------------------------------------------------------------------ No results for input(s): CHOL, HDL, LDLCALC, TRIG, CHOLHDL, LDLDIRECT in the last 72 hours. ------------------------------------------------------------------------------------------------------------------ No results for input(s): TSH, T4TOTAL, T3FREE, THYROIDAB in the last 72 hours.  Invalid input(s): FREET3 ------------------------------------------------------------------------------------------------------------------ No results for input(s): VITAMINB12, FOLATE, FERRITIN, TIBC, IRON, RETICCTPCT in the last 72 hours.  Coagulation profile  Recent Labs Lab 07/07/14 0500  INR 1.06    No results for input(s): DDIMER in the last 72 hours.  Cardiac Enzymes No results for input(s): CKMB, TROPONINI, MYOGLOBIN in the last 168 hours.  Invalid input(s): CK ------------------------------------------------------------------------------------------------------------------ Invalid input(s): POCBNP     Time Spent in minutes   30 minutes   Rayvn Rickerson M.D on 07/09/2014 at 6:34 PM  Between 7am to 7pm - Pager - [phone]  After 7pm go to [email or web address] - password [email or web address]  And look for the night coverage person covering for me after hours  [facility] Office  [phone]   **Disclaimer: This note may have been dictated with voice recognition software. Similar sounding words can inadvertently be transcribed and this note may contain transcription errors which may not have been corrected  upon publication of note.**

## 2014-07-10 ENCOUNTER — Other Ambulatory Visit: Payer: BLUE CROSS/BLUE SHIELD

## 2014-07-10 DIAGNOSIS — K565 Intestinal adhesions [bands] with obstruction (postprocedural) (postinfection): Secondary | ICD-10-CM

## 2014-07-10 LAB — GLUCOSE, CAPILLARY
GLUCOSE-CAPILLARY: 125 mg/dL — AB (ref 70–99)
GLUCOSE-CAPILLARY: 144 mg/dL — AB (ref 70–99)
GLUCOSE-CAPILLARY: 78 mg/dL (ref 70–99)
GLUCOSE-CAPILLARY: 88 mg/dL (ref 70–99)
Glucose-Capillary: 110 mg/dL — ABNORMAL HIGH (ref 70–99)
Glucose-Capillary: 62 mg/dL — ABNORMAL LOW (ref 70–99)
Glucose-Capillary: 74 mg/dL (ref 70–99)
Glucose-Capillary: 84 mg/dL (ref 70–99)

## 2014-07-10 MED ORDER — DEXTROSE 50 % IV SOLN
INTRAVENOUS | Status: AC
  Filled 2014-07-10: qty 50

## 2014-07-10 NOTE — Progress Notes (Signed)
Patient ID: Kendra Mcfarland, female   DOB: 1950/07/14, 64 y.o.   MRN: [id number]    Subjective: Pt feels well today, except for episodes of hypoglycemia.  Passed more flatus and more BMs.  Objective: Vital signs in last 24 hours: Temp:  [97.6 F (36.4 C)-98.3 F (36.8 C)] 98.3 F (36.8 C) (02/24 0604) Pulse Rate:  [88-101] 88 (02/24 0604) Resp:  [18] 18 (02/24 0604) BP: (126-132)/(58-63) 126/59 mmHg (02/24 0604) SpO2:  [92 %-96 %] 92 % (02/24 0604) Last BM Date: 07/10/14  Intake/Output from previous day: 15-Jul-2022 0701 - 02/24 0700 In: 2958.8 [P.O.:120; I.V.:2838.8] Out: 300 [Emesis/NG output:300] Intake/Output this shift:    PE: Abd: soft, Nt, Nd, +BS  Lab Results:   Recent Labs  07/08/14 0507 07-15-2014 0929  WBC 14.1* 14.5*  HGB 11.9* 11.9*  HCT 37.9 38.9  PLT 892* 894*   BMET  Recent Labs  07/08/14 0507 2014/07/15 0929  NA 143 143  K 4.0 4.6  CL 101 103  CO2 31 29  GLUCOSE 73 80  BUN 16 18  CREATININE 1.36* 1.34*  CALCIUM 9.4 9.3   PT/INR No results for input(s): LABPROT, INR in the last 72 hours. CMP     Component Value Date/Time   NA 143 2014-07-15 0929   NA 143 11/14/2013 1040   K 4.6 Jul 15, 2014 0929   K 2.9* 11/14/2013 1040   CL 103 2014/07/15 0929   CO2 29 Jul 15, 2014 0929   CO2 37* 11/14/2013 1040   GLUCOSE 80 07-15-2014 0929   GLUCOSE 130 11/14/2013 1040   BUN 18 July 15, 2014 0929   BUN 10.8 11/14/2013 1040   CREATININE 1.34* 07-15-2014 0929   CREATININE 0.9 11/14/2013 1040   CALCIUM 9.3 Jul 15, 2014 0929   CALCIUM 9.0 11/14/2013 1040   PROT 7.1 07/07/2014 0500   PROT 7.0 11/14/2013 1040   ALBUMIN 3.1* 07/07/2014 0500   ALBUMIN 2.4* 11/14/2013 1040   AST 25 07/07/2014 0500   AST 17 11/14/2013 1040   ALT 34 07/07/2014 0500   ALT 18 11/14/2013 1040   ALKPHOS 212* 07/07/2014 0500   ALKPHOS 133 11/14/2013 1040   BILITOT 0.4 07/07/2014 0500   BILITOT 0.54 11/14/2013 1040   GFRNONAA 41* 07/15/14 0929   GFRAA 47* 07/15/2014 0929   Lipase      Component Value Date/Time   LIPASE 31 07/06/2014 1828       Studies/Results: Dg Abd 2 Views  07-15-14   CLINICAL DATA:  Left nephrectomy in mid January with onset of constipation thereafter; persistent symptoms  EXAM: ABDOMEN - 2 VIEW  COMPARISON:  Supine and upright abdominal films of July 08, 2014  FINDINGS: The nasogastric tube has been removed. There remain loops of mildly distended gas-filled small bowel in the mid abdomen. There is contrast in the transverse colon and proximal descending colon which has migrated further distally since yesterday's study. No free extraluminal gas collections are demonstrated.  There is stable dextrocurvature of the mid lumbar spine. The lung bases are clear  IMPRESSION: Bowel gas pattern is consistent with a partial distal small bowel obstruction. There has been some further distal migration of the CT contrast material into the region of the hepatic flexure.   Electronically Signed   By: David  Martinique   On: 07-15-2014 12:57    Anti-infectives: Anti-infectives    None       Assessment/Plan  1. SBO 2. S/p recent nephrectomy and splenectomy for RCC  Plan: 1. Patient seems to be clinically improving.  Will try clear liquids today.  If she continues to improve, her transfer may be able to be held.  If she fails her progression or otherwise worsens, then she will still require transfer.   LOS: 4 days    Kendra Mcfarland E 07/10/2014, 10:02 AM Pager: [phone]

## 2014-07-10 NOTE — Progress Notes (Signed)
[facility] PROGRESS NOTE  Kendra Mcfarland UDJ:[id number] DOB: [birth year] DOA: 07/06/2014 PCP: Lottie Dawson, MD  Summary I have seen and examined Kendra Mcfarland at bedside and reviewed her chart. Appreciate general surgery. Kendra Mcfarland is a 64yo woman with PMH of metastatic renal cell carcinoma on the left s/p nephrectomy and splenectomy on 05/31/14, PRES, anemia, HLD, HTN, spinal stenosis whom I know from her most recent hospital stay, and presented with constipation and abdominal pain associated with nausea and vomiting, of green bilious material. She reported that  she had poor bowel movement since surgery .  Kendra Mcfarland was recently admitted on 1/23 to this hospital and was diagnosed with PRES. She was treated with BP lowering medications and her symptoms of confusion and difficulty seeing having improved. She continues to have difficulty differentiating between some numbers and letters when she reads, but otherwise is doing well.   In the ED, an NGT was placed to lower IMWS and she had a CT of her abdomen/pelvis. The CT scan showed SBO due to internal herniation, soft tissue stranding due to surgery or peritonitis, large amount of stool in the rectum. As well did show evidence of new pulmonary nodules not present on previous scan. As well did show evidence of new pulmonary nodules not present on previous scan. Patient is now on full liquids per surgery. She had been accepted to [facility] but if she continues to make improvement she may be able to go home and follow with her primary care providers in the outpatient setting. Plan Bowel obstruction/S/p nephrectomy/S/P splenectomy  Seems to be improving  Follow general surgery recommendations  Can transfer to [facility] if necessary Thrombocytosis/Metastatic renal cell carcinoma/PRES (posterior reversible encephalopathy syndrome)  To be followed at [facility] Hyperlipidemia/Essential hypertension/Degenerative joint disease of low back  No  acute changes Code Status: Full Family Communication: Husband at bedside Disposition Plan: Home/[facility]   Consultants:  General surgery  Procedures:  NG tube to low intermittent suction  Antibiotics:  None  HPI/Subjective: Passing gas. No nausea or vomiting.  Objective: Filed Vitals:   07/10/14 1406  BP: 129/57  Pulse: 94  Temp: 98.7 F (37.1 C)  Resp: 18    Intake/Output Summary (Last 24 hours) at 07/10/14 1527 Last data filed at 07/10/14 1504  Gross per 24 hour  Intake 3738.75 ml  Output      0 ml  Net 3738.75 ml   Filed Weights   07/06/14 1747 07/07/14 0110  Weight: 60.328 kg (133 lb) 62.914 kg (138 lb 11.2 oz)    Exam:   General:  Comfortable at rest.  Cardiovascular: S1-S2 normal. No murmurs. Pulse regular.  Respiratory: Good air entry bilaterally. No rhonchi or rales.  Abdomen: Soft and nontender. Normal bowel sounds. No organomegaly.  Musculoskeletal: No pedal edema   Neurological: Intact  Data Reviewed: Basic Metabolic Panel:  Recent Labs Lab 07/06/14 1828 07/07/14 0500 07/08/14 0507 07/09/14 0929  NA 135 141 143 143  K 4.2 4.3 4.0 4.6  CL 99 101 101 103  CO2 25 31 31 29   GLUCOSE 100* 95 73 80  BUN 21 16 16 18   CREATININE 1.36* 1.28* 1.36* 1.34*  CALCIUM 9.4 9.7 9.4 9.3   Liver Function Tests:  Recent Labs Lab 07/06/14 1828 07/07/14 0500  AST 33 25  ALT 39* 34  ALKPHOS 230* 212*  BILITOT 0.4 0.4  PROT 7.1 7.1  ALBUMIN 3.1* 3.1*    Recent Labs Lab 07/06/14 1828  LIPASE 31   No  results for input(s): AMMONIA in the last 168 hours. CBC:  Recent Labs Lab 07/06/14 1828 07/07/14 0500 07/08/14 0507 07/09/14 0929  WBC 14.0* 13.9* 14.1* 14.5*  HGB 12.4 12.9 11.9* 11.9*  HCT 38.6 40.7 37.9 38.9  MCV 80.9 81.7 82.9 83.7  PLT 814* 850* 892* 894*   Cardiac Enzymes: No results for input(s): CKTOTAL, CKMB, CKMBINDEX, TROPONINI in the last 168 hours. BNP (last 3 results) No results for input(s): BNP in the last 8760  hours.  ProBNP (last 3 results) No results for input(s): PROBNP in the last 8760 hours.  CBG:  Recent Labs Lab 07/10/14 0017 07/10/14 0345 07/10/14 0408 07/10/14 0744 07/10/14 1131  GLUCAP 144* 62* 125* 74 110*    No results found for this or any previous visit (from the past 240 hour(s)).   Studies: Dg Abd 2 Views  07/09/2014   CLINICAL DATA:  Left nephrectomy in mid January with onset of constipation thereafter; persistent symptoms  EXAM: ABDOMEN - 2 VIEW  COMPARISON:  Supine and upright abdominal films of July 08, 2014  FINDINGS: The nasogastric tube has been removed. There remain loops of mildly distended gas-filled small bowel in the mid abdomen. There is contrast in the transverse colon and proximal descending colon which has migrated further distally since yesterday's study. No free extraluminal gas collections are demonstrated.  There is stable dextrocurvature of the mid lumbar spine. The lung bases are clear  IMPRESSION: Bowel gas pattern is consistent with a partial distal small bowel obstruction. There has been some further distal migration of the CT contrast material into the region of the hepatic flexure.   Electronically Signed   By: David  Martinique   On: 07/09/2014 12:57    Scheduled Meds: . antiseptic oral rinse  7 mL Mouth Rinse q12n4p  . chlorhexidine  15 mL Mouth Rinse BID  . enoxaparin (LOVENOX) injection  40 mg Subcutaneous Q24H  . metoprolol  5 mg Intravenous Q12H   Continuous Infusions: . sodium chloride 75 mL/hr at 07/09/14 1647     Time spent: 25 minutes    Kendra Mcfarland  [facility] Pager [phone]. If 7PM-7AM, please contact night-coverage at [email or web address], password [facility] 07/10/2014, 3:27 PM  LOS: 4 days

## 2014-07-10 NOTE — Progress Notes (Signed)
Hypoglycemic Event  CBG: 62  Treatment: 1/2 amp d50  Symptoms: none  Follow-up CBG: [id number] CBG Result:125  Possible Reasons for Event: pt is npo  Comments/MD notified:no    Kendra Mcfarland, Kendra Mcfarland  Remember to initiate Hypoglycemia Order Set & complete

## 2014-07-10 NOTE — Progress Notes (Signed)
Talked with [facility] ,reported pt. stable and transfer remains in effect.

## 2014-07-10 NOTE — Progress Notes (Signed)
Hypoglycemic Event  CBG: 65  Treatment: 1/2 amp D50  Symptoms:  None  Follow-up CBG: Time:0015 CBG Result:144  Possible Reasons for Event:  NPO  Comments/MD notified:no    Kendra Mcfarland, Jimmy Picket  Remember to initiate Hypoglycemia Order Set & complete

## 2014-07-11 LAB — CBC
HCT: 36.9 % (ref 36.0–46.0)
Hemoglobin: 11.6 g/dL — ABNORMAL LOW (ref 12.0–15.0)
MCH: 26.4 pg (ref 26.0–34.0)
MCHC: 31.4 g/dL (ref 30.0–36.0)
MCV: 84.1 fL (ref 78.0–100.0)
Platelets: 912 10*3/uL (ref 150–400)
RBC: 4.39 MIL/uL (ref 3.87–5.11)
RDW: 16 % — ABNORMAL HIGH (ref 11.5–15.5)
WBC: 10.8 10*3/uL — ABNORMAL HIGH (ref 4.0–10.5)

## 2014-07-11 LAB — COMPREHENSIVE METABOLIC PANEL
ALK PHOS: 126 U/L — AB (ref 39–117)
ALT: 16 U/L (ref 0–35)
ANION GAP: 11 (ref 5–15)
AST: 18 U/L (ref 0–37)
Albumin: 2.5 g/dL — ABNORMAL LOW (ref 3.5–5.2)
BUN: 9 mg/dL (ref 6–23)
CO2: 26 mmol/L (ref 19–32)
Calcium: 8.6 mg/dL (ref 8.4–10.5)
Chloride: 102 mmol/L (ref 96–112)
Creatinine, Ser: 1.04 mg/dL (ref 0.50–1.10)
GFR calc Af Amer: 64 mL/min — ABNORMAL LOW (ref >90)
GFR calc non Af Amer: 56 mL/min — ABNORMAL LOW (ref >90)
Glucose, Bld: 80 mg/dL (ref 70–99)
Potassium: 3.6 mmol/L (ref 3.5–5.1)
SODIUM: 139 mmol/L (ref 135–145)
TOTAL PROTEIN: 5.6 g/dL — AB (ref 6.0–8.3)
Total Bilirubin: 0.9 mg/dL (ref 0.3–1.2)

## 2014-07-11 LAB — GLUCOSE, CAPILLARY
Glucose-Capillary: 74 mg/dL (ref 70–99)
Glucose-Capillary: 80 mg/dL (ref 70–99)

## 2014-07-11 NOTE — Progress Notes (Signed)
Patient ID: Kendra Mcfarland, female   DOB: 06-15-1950, 64 y.o.   MRN: [id number]    Subjective: Doing well.  Tolerating full liquids.  Still passing flatus  Objective: Vital signs in last 24 hours: Temp:  [97.9 F (36.6 C)-98.7 F (37.1 C)] 98.4 F (36.9 C) (02/25 0521) Pulse Rate:  [83-94] 83 (02/25 0521) Resp:  [18] 18 (02/25 0521) BP: (125-137)/(54-57) 137/56 mmHg (02/25 0521) SpO2:  [93 %-96 %] 93 % (02/25 0521) Last BM Date: 07/11/14  Intake/Output from previous day: 02/24 0701 - 02/25 0700 In: 1140 [P.O.:1140] Out: -  Intake/Output this shift:    PE: Abd: soft, Nt, ND, +BS  Lab Results:   Recent Labs  17-Jul-2014 0929 07/11/14 0446  WBC 14.5* 10.8*  HGB 11.9* 11.6*  HCT 38.9 36.9  PLT 894* 912*   BMET  Recent Labs  2014-07-17 0929 07/11/14 0446  NA 143 139  K 4.6 3.6  CL 103 102  CO2 29 26  GLUCOSE 80 80  BUN 18 9  CREATININE 1.34* 1.04  CALCIUM 9.3 8.6   PT/INR No results for input(s): LABPROT, INR in the last 72 hours. CMP     Component Value Date/Time   NA 139 07/11/2014 0446   NA 143 11/14/2013 1040   K 3.6 07/11/2014 0446   K 2.9* 11/14/2013 1040   CL 102 07/11/2014 0446   CO2 26 07/11/2014 0446   CO2 37* 11/14/2013 1040   GLUCOSE 80 07/11/2014 0446   GLUCOSE 130 11/14/2013 1040   BUN 9 07/11/2014 0446   BUN 10.8 11/14/2013 1040   CREATININE 1.04 07/11/2014 0446   CREATININE 0.9 11/14/2013 1040   CALCIUM 8.6 07/11/2014 0446   CALCIUM 9.0 11/14/2013 1040   PROT 5.6* 07/11/2014 0446   PROT 7.0 11/14/2013 1040   ALBUMIN 2.5* 07/11/2014 0446   ALBUMIN 2.4* 11/14/2013 1040   AST 18 07/11/2014 0446   AST 17 11/14/2013 1040   ALT 16 07/11/2014 0446   ALT 18 11/14/2013 1040   ALKPHOS 126* 07/11/2014 0446   ALKPHOS 133 11/14/2013 1040   BILITOT 0.9 07/11/2014 0446   BILITOT 0.54 11/14/2013 1040   GFRNONAA 56* 07/11/2014 0446   GFRAA 64* 07/11/2014 0446   Lipase     Component Value Date/Time   LIPASE 31 07/06/2014 1828        Studies/Results: Dg Abd 2 Views  07/17/14   CLINICAL DATA:  Left nephrectomy in mid January with onset of constipation thereafter; persistent symptoms  EXAM: ABDOMEN - 2 VIEW  COMPARISON:  Supine and upright abdominal films of July 08, 2014  FINDINGS: The nasogastric tube has been removed. There remain loops of mildly distended gas-filled small bowel in the mid abdomen. There is contrast in the transverse colon and proximal descending colon which has migrated further distally since yesterday's study. No free extraluminal gas collections are demonstrated.  There is stable dextrocurvature of the mid lumbar spine. The lung bases are clear  IMPRESSION: Bowel gas pattern is consistent with a partial distal small bowel obstruction. There has been some further distal migration of the CT contrast material into the region of the hepatic flexure.   Electronically Signed   By: David  Martinique   On: 2014/07/17 12:57    Anti-infectives: Anti-infectives    None       Assessment/Plan  1. sbo s/p recent nephrectomy and splenectomy at [place]: 1. Advance to soft diet.  If she tolerates this, she may be discharged home.  She  will need to call her primary surgeon and update him on her current state.   LOS: 5 days    Errica Dutil E 07/11/2014, 9:34 AM Pager: [phone]

## 2014-07-11 NOTE — Progress Notes (Signed)
AVS discharge instructions were reviewed with patient and her husband. Patient stated that she did not have any questions. Called for volunteer to assist patient to her transportation.

## 2014-07-11 NOTE — Discharge Summary (Signed)
Kendra Mcfarland, is a 63 y.o. female  DOB June 26, 1950  MRN [id number].  Admission date:  07/06/2014  Admitting Physician  Sid Falcon, MD  Discharge Date:  07/11/2014   Primary MD  Lottie Dawson, MD  Recommendations for primary care physician for things to follow:  Follow up pulmonary nodules. If recurrence of symptoms follow-up at [facility] where her primary surgeons are.   Admission Diagnosis   SBO (small bowel obstruction) [K56.69] Constipation [K59.00] Intestinal obstruction, unspecified intestinal obstruction type [K56.60]   Discharge Diagnosis  SBO (small bowel obstruction) [K56.69] Constipation [K59.00] Intestinal obstruction, unspecified intestinal obstruction type [K56.60]   Active Problems:   Thrombocytosis   S/P splenectomy   Hyperlipidemia   Essential hypertension   Degenerative joint disease of low back   Metastatic renal cell carcinoma   PRES (posterior reversible encephalopathy syndrome)   S/p nephrectomy      Kendra is a pleasant 64yo female with PMH of metastatic renal cell carcinoma on the left s/p nephrectomy and splenectomy on 05/31/14, PRES, anemia, HLD, HTN, spinal stenosis whom I know from her most recent hospital stay, and presented with constipation and abdominal pain associated with nausea and vomiting, of green bilious material. She reported that she had poor bowel movement since surgery .  Kendra Mcfarland was recently admitted on 1/23 to this hospital and was diagnosed with PRES. She was treated with BP lowering medications and her symptoms of confusion and difficulty seeing having improved. She continues to have difficulty differentiating between some numbers and letters when she reads, but otherwise is doing well.   In the ED, an NGT was placed to lower  IMWS and she had a CT of her abdomen/pelvis. The CT scan showed "SBO due to internal herniation, soft tissue stranding due to surgery or peritonitis, large amount of stool in the rectum. It also showed evidence of new pulmonary nodules not present on previous scan and these will need follow up by her primary team.  Patient is now tolerating soft diet and she is stable for discharge to follow with her primary surgeons at [facility]. She was followed by general surgery who instituted conservative management including NG tube to low intermittent suction with resolution of symptoms. She has persistent leukocytosis/thrombocytosis with no evidence of infection. Will defer to her primary team for follow-up of these issues.  Discharge Condition Stable.  Consults obtained  General surgery  Follow UP  primary care team at [facility] including general surgery/hematology/oncology/urology/neurology.   Discharge Instructions  and  Discharge Medications  Discharge Instructions    Diet - low sodium heart healthy    Complete by:  As directed      Increase activity slowly    Complete by:  As directed             Medication List    STOP taking these medications        acetaminophen 325 MG tablet  Commonly known as:  TYLENOL     docusate sodium 100 MG capsule  Commonly known as:  COLACE     psyllium 58.6 % packet  Commonly known as:  METAMUCIL     senna 8.6 MG Tabs tablet  Commonly known as:  SENOKOT      TAKE these medications        amLODipine 10 MG tablet  Commonly known as:  NORVASC  Take 1 tablet (10 mg total) by mouth daily.     antiseptic oral rinse 0.05 % Liqd solution  Commonly known as:  CPC / CETYLPYRIDINIUM CHLORIDE 0.05%  7 mLs by Mouth Rinse route 2 times daily at 12 noon and 4 pm.     bisacodyl 5 MG EC tablet  Commonly known as:  DULCOLAX  Take 15 mg by mouth daily as needed for moderate constipation.     carvedilol 3.125 MG tablet  Commonly known as:  COREG  Take 1 tablet  (3.125 mg total) by mouth 2 (two) times daily with a meal.     cholecalciferol 1000 UNITS tablet  Commonly known as:  VITAMIN D  Take 1,000 Units by mouth daily.     clonazePAM 0.5 MG tablet  Commonly known as:  KLONOPIN  Take 1 tablet (0.5 mg total) by mouth 2 (two) times daily.     esomeprazole 40 MG capsule  Commonly known as:  NEXIUM  Take 1 capsule (40 mg total) by mouth daily before breakfast.     multivitamin with minerals Tabs tablet  Take 1 tablet by mouth daily.     rosuvastatin 10 MG tablet  Commonly known as:  CRESTOR  Take 1 tablet (10 mg total) by mouth daily.     sodium chloride 0.9 % infusion  Inject 75 mLs into the vein continuous.     Vitamin B-12 5000 MCG Subl  Place 1 tablet under the tongue daily.        Diet and Activity recommendation: See Discharge Instructions above  Major procedures and Radiology Reports - PLEASE review detailed and final reports for all details, in brief -    Ct Abdomen Pelvis Wo Contrast  07/06/2014   CLINICAL DATA:  Lower abdominal pain and constipation. Status post left nephrectomy and splenectomy 5 weeks ago for renal cell carcinoma. Constipation since the surgery. Lower abdominal pain for the past 2 weeks. The pain is worse Mcfarland, with nausea and vomiting.  EXAM: CT ABDOMEN AND PELVIS WITHOUT CONTRAST  TECHNIQUE: Multidetector CT imaging of the abdomen and pelvis was performed following the standard protocol without IV contrast.  COMPARISON:  04/10/2014.  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 Interval dilated proximal and mid jejunum with a transition to normal caliber small bowel in the upper pelvis to the left of midline. This appears to be due to an interval internal hernia, best seen on coronal images 43 and 44. The distal small bowel is normal in caliber and not opacified, as is the colon. A large amount of stool was noted in the rectum.  Also demonstrated is interval soft tissue stranding in the  intra-abdominal fat, most pronounced anteriorly on the right at the level of the mid to lower abdomen. Interval midline surgical scar.  Two large gallstones are again demonstrated in the gallbladder. One measures 2.9 cm in maximum diameter and the other measures 2.5 cm in maximum diameter. No gallbladder or wall thickening or pericholecystic fluid is seen.  Unremarkable non contrasted appearance of the liver, pancreas, adrenal glands, right kidney and urinary bladder. Surgically absent uterus. Normal appearing ovaries. Mildly  prominent left para-aortic lymph nodes are again demonstrated. The largest has a short axis diameter of 6 mm on image number 39.  Interval development of an 11 mm subpleural nodule in the posterior aspect of the right lower lobe on image number 12, with an adjacent 5 mm nodule. There is also an interval 6 mm nodule in the right lower lobe laterally on image number 6 and an interval 5 mm nodule in the anterior aspect of the right middle lobe on image 3. There is also a possible developing 3 mm nodule at a vessel branch point in the left lower lobe on image 3. Also noted are coronary artery calcifications.  Thoracolumbar scoliosis and degenerative changes are again demonstrated. 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  IMPRESSION: 1. Interval mid small bowel obstruction due to an internal hernia centered in the midline at the level of the lower abdomen and upper pelvis. 2. Interval soft tissue stranding in the intraperitoneal fat anteriorly, especially on the right. This could be related to the recent surgery or due to peritonitis. 3. Interval development of 4 lung nodules in the right lower lung zone and a possible developing lung nodule in the left lower lobe. These are concerning for interval metastases. 4. Interval splenectomy and left nephrectomy. 5. Stable borderline enlarged left para-aortic lymph nodes. These  could be reactive or early metastatic nodes. 6. Stable cholelithiasis without evidence of cholecystitis. 7. Large amount of stool in the rectum. 8. Atheromatous coronary artery calcifications. 9. Bilateral L5 spondylolysis with associated grade 1 spondylolisthesis at the L5-S1 level.   Electronically Signed   By: Claudie Revering M.D.   On: 07/06/2014 20:47   Dg Chest 2 View  06/19/2014   CLINICAL DATA:  Lower chest pain.  EXAM: CHEST  2 VIEW  COMPARISON:  CT 04/10/2014.  FINDINGS: Mediastinum and hilar structures are normal. Bibasilar subsegmental atelectasis. Left lower lobe mild infiltrate with small left pleural effusion noted. No pneumothorax. Heart size normal. No acute or focal bony abnormality.  IMPRESSION: Left lower lobe mild infiltrate with small left pleural effusion.   Electronically Signed   By: Marcello Moores  Register   On: 06/19/2014 16:53   Dg Abd 2 Views  07/09/2014   CLINICAL DATA:  Left nephrectomy in mid January with onset of constipation thereafter; persistent symptoms  EXAM: ABDOMEN - 2 VIEW  COMPARISON:  Supine and upright abdominal films of July 08, 2014  FINDINGS: The nasogastric tube has been removed. There remain loops of mildly distended gas-filled small bowel in the mid abdomen. There is contrast in the transverse colon and proximal descending colon which has migrated further distally since yesterday's study. No free extraluminal gas collections are demonstrated.  There is stable dextrocurvature of the mid lumbar spine. The lung bases are clear  IMPRESSION: Bowel gas pattern is consistent with a partial distal small bowel obstruction. There has been some further distal migration of the CT contrast material into the region of the hepatic flexure.   Electronically Signed   By: David  Martinique   On: 07/09/2014 12:57   Dg Abd 2 Views  07/08/2014   CLINICAL DATA:  Small bowel obstruction, postoperative adhesions  EXAM: ABDOMEN - 2 VIEW  COMPARISON:  07/07/2014  FINDINGS: Nasogastric tube  projects over proximal stomach.  Small mild residual contrast in RIGHT colon.  Prominent stool in rectum.  Slightly dcreased small bowel distention since the previous exam though a single loop of  small bowel in the mid abdomen remains mildly dilated.  No bowel wall thickening or free intraperitoneal air.  Bones demineralized with dextro convex thoracolumbar scoliosis and degenerative changes.  No definite urinary tract calcification.  IMPRESSION: Slightly decreased small bowel distention.   Electronically Signed   By: Lavonia Dana M.D.   On: 07/08/2014 08:09   Dg Abd Portable 1v  07/07/2014   CLINICAL DATA:  Five weeks post laparoscopic LEFT nephrectomy and splenectomy for metastatic renal cell carcinoma, only a few bowel movements over past several weeks, abdominal distention and tenderness, crampy pain, vomited copious amounts of green liquid in emergency room question small bowel obstruction  EXAM: PORTABLE ABDOMEN - 1 VIEW  COMPARISON:  Abdominal radiograph and CT abdomen of 07/06/2014  FINDINGS: Increased stool in rectum.  Tip of nasogastric tube projects over stomach.  Single dilated small bowel loop in the LEFT mid abdomen.  Gas and stool in RIGHT colon, small amount.  Visualized dilated small bowel loop is similar to that seen on the previous exam consistent with small bowel obstruction.  Minimal retained contrast in stomach prior CT.  No free intraperitoneal air.  Osseous demineralization with dextro convex lumbar scoliosis and scattered degenerative disc/facet disease changes of the lumbar spine.  IMPRESSION: Persistent dilatation of a small bowel loop in the mid abdomen, corresponding to small bowel obstruction identified on recent CT.  No significant interval change in appearance since 07/06/2014 exam.   Electronically Signed   By: Lavonia Dana M.D.   On: 07/07/2014 10:31   Dg Abd Portable 1v  07/07/2014   CLINICAL DATA:  Nasogastric tube placement  EXAM: PORTABLE ABDOMEN - 1 VIEW  COMPARISON:  CT  07/06/2014  FINDINGS: Nasogastric tube extends into the stomach with tip in the region of the mid gastric body. There is persistent small bowel dilatation in the left abdomen. No free air is evident on this single supine portable radiograph.  IMPRESSION: Nasogastric tube extends into the stomach with tip in the mid gastric body.   Electronically Signed   By: Andreas Newport M.D.   On: 07/07/2014 00:13    Micro Results   No results found for this or any previous visit (from the past 240 hour(s)).     Mcfarland   Subjective:   Kendra Mcfarland has no headache,no chest abdominal pain,no new weakness tingling or numbness, feels much better wants to go home Mcfarland.   Objective:   Blood pressure 134/53, pulse 102, temperature 98.3 F (36.8 C), temperature source Oral, resp. rate 18, height 4\' 11"  (1.499 m), weight 62.914 kg (138 lb 11.2 oz), SpO2 93 %.   Intake/Output Summary (Last 24 hours) at 07/11/14 1106 Last data filed at 07/10/14 1822  Gross per 24 hour  Intake    900 ml  Output      0 ml  Net    900 ml    Exam Awake Alert, Oriented x 3, No new F.N deficits, Normal affect [place].AT,PERRAL Supple Neck,No JVD, No cervical lymphadenopathy appriciated.  Symmetrical Chest wall movement, Good air movement bilaterally, CTAB RRR,No Gallops,Rubs or new Murmurs, No Parasternal Heave +ve B.Sounds, Abd Soft, Non tender, No organomegaly appriciated, No rebound -guarding or rigidity. No Cyanosis, Clubbing or edema, No new Rash or bruise  Data Review   CBC w Diff: Lab Results  Component Value Date   WBC 10.8* 07/11/2014   WBC 10.4* 03/18/2014   HGB 11.6* 07/11/2014   HGB 9.8* 03/18/2014   HCT 36.9 07/11/2014  HCT 31.8* 03/18/2014   PLT 912* 07/11/2014   PLT 672* 03/18/2014   LYMPHOPCT 25 06/14/2014   LYMPHOPCT 18.4 03/18/2014   MONOPCT 11 06/14/2014   MONOPCT 9.9 03/18/2014   EOSPCT 6* 06/14/2014   EOSPCT 1.8 03/18/2014   BASOPCT 0 06/14/2014   BASOPCT 1.3 03/18/2014    CMP:  Lab Results  Component Value Date   NA 139 07/11/2014   NA 143 11/14/2013   K 3.6 07/11/2014   K 2.9* 11/14/2013   CL 102 07/11/2014   CO2 26 07/11/2014   CO2 37* 11/14/2013   BUN 9 07/11/2014   BUN 10.8 11/14/2013   CREATININE 1.04 07/11/2014   CREATININE 0.9 11/14/2013   PROT 5.6* 07/11/2014   PROT 7.0 11/14/2013   ALBUMIN 2.5* 07/11/2014   ALBUMIN 2.4* 11/14/2013   BILITOT 0.9 07/11/2014   BILITOT 0.54 11/14/2013   ALKPHOS 126* 07/11/2014   ALKPHOS 133 11/14/2013   AST 18 07/11/2014   AST 17 11/14/2013   ALT 16 07/11/2014   ALT 18 11/14/2013  .   Total Time in preparing paper work, data evaluation and todays exam - 35 minutes  Kendra Mcfarland M.D on 07/11/2014 at 11:06 AM  [facility] Office  [phone]

## 2014-07-17 ENCOUNTER — Ambulatory Visit (INDEPENDENT_AMBULATORY_CARE_PROVIDER_SITE_OTHER): Payer: BLUE CROSS/BLUE SHIELD | Admitting: Internal Medicine

## 2014-07-17 ENCOUNTER — Encounter: Payer: Self-pay | Admitting: *Deleted

## 2014-07-17 ENCOUNTER — Encounter: Payer: Self-pay | Admitting: Internal Medicine

## 2014-07-17 VITALS — BP 128/66 | Temp 97.6°F | Ht 59.0 in | Wt 135.5 lb

## 2014-07-17 DIAGNOSIS — N289 Disorder of kidney and ureter, unspecified: Secondary | ICD-10-CM

## 2014-07-17 DIAGNOSIS — Z79899 Other long term (current) drug therapy: Secondary | ICD-10-CM

## 2014-07-17 DIAGNOSIS — I10 Essential (primary) hypertension: Secondary | ICD-10-CM

## 2014-07-17 DIAGNOSIS — F4322 Adjustment disorder with anxiety: Secondary | ICD-10-CM

## 2014-07-17 DIAGNOSIS — R946 Abnormal results of thyroid function studies: Secondary | ICD-10-CM

## 2014-07-17 DIAGNOSIS — C642 Malignant neoplasm of left kidney, except renal pelvis: Secondary | ICD-10-CM

## 2014-07-17 DIAGNOSIS — R7989 Other specified abnormal findings of blood chemistry: Secondary | ICD-10-CM

## 2014-07-17 MED ORDER — AMLODIPINE BESYLATE 10 MG PO TABS: 10.0000 mg | ORAL_TABLET | Freq: Every day | ORAL | Status: DC

## 2014-07-17 MED ORDER — CARVEDILOL 3.125 MG PO TABS: 3.1250 mg | ORAL_TABLET | Freq: Two times a day (BID) | ORAL | Status: DC

## 2014-07-17 MED ORDER — CLONAZEPAM 0.5 MG PO TABS: 0.5000 mg | ORAL_TABLET | Freq: Two times a day (BID) | ORAL | Status: DC | PRN

## 2014-07-17 NOTE — Patient Instructions (Addendum)
  BP Readings from Last 3 Encounters:  07/17/14 128/66  07/11/14 136/59  06/19/14 132/70  Benzodiazepams  Can be habit forming .    And done treated depressive sx  Just  for anxiety . Ok to take with caution . Renew bp medication Need TSH with next blood test ( abnormal  TSH thyroid tests)  Stay on newer BP medications

## 2014-07-17 NOTE — Progress Notes (Signed)
Pre visit review using our clinic review tool, if applicable. No additional management support is needed unless otherwise documented below in the visit note.  Chief Complaint  Patient presents with  . Follow-up    HPI: Kendra Mcfarland 64 y.o. is here with her husband carries a diagnosis of  dx stage 4 renal cancer  recenetly hosp for partial bowel aobstruction rx conservatively, fu with [facility]  Soon   recnet chest ct to check out.  Pathology Renal cell carcinoma, clear cell type with abundant necrosis, Furhman grade 4 measuring 7.1cm. No invasion into the renal sinus, major veins, lymphatic and venous. Invasion present into the capsule/preirenal adipose tissue, Gerota's fascia and ureter. Gerota's fascia margin negative. Renal vein margin negative. Ureter margin negative. 0/2 lymph nodes were positive Stage: pT3a, pN0  Told to do  Have Low residue diet  And  Liquid   And miralax.  Not predictable. Soft mushy something passing.  And gas. Has to crush up her pills at this point Has appt   May  To be moved up. If there are concerns about her lung CT  Noted new chest nodules on the past x-rays and the recent hospitalization. Otherwise she is doing fairly well no fevers severe pain the diaphragmatic pain is resolved no fever falling or gross bleeding. Blood pressure medicines were changed because of elevated creatinine off the ace arm inhibitors and on Coreg will need a refill He tends to get anxious the surgeon in [facility] gave her clonazepam to take twice a day and told her to take it regularly but will refill it and told her that her primary care doctor should give her this medicine husband thinks it is helped her on stressful days. Denies any falling. ROS: See pertinent positives and negatives per HPI.  Past Medical History  Diagnosis Date  . Anemia     years ago  . Transfusion history     before her hysterectomy  . Anxiety   . Hyperlipidemia   . Hypertension   . Spinal stenosis   .  Spondylosis     degenerative adult  . Bulging lumbar disc   . MONONUCLEOSIS 08/14/2007    Qualifier: Diagnosis of  Problem Stop Reason: Removed By: Hulan Saas, CMA (AAMA), Quita Skye   . Vertigo   . Iron disorder     excessive iron storage   . Cancer     Family History  Problem Relation Age of Onset  . Hypertension Mother   . Arthritis Mother   . Hyperlipidemia Mother   . Memory loss Mother 39    short term  . Other Mother 91    angioplasty  . Arthritis Father   . Hypertension Father   . Heart attack Father   . Hyperthyroidism Sister   . Anemia Sister   . Hypothyroidism Sister   . Hyperlipidemia Sister   . Colon cancer Neg Hx   . Esophageal cancer Neg Hx   . Rectal cancer Neg Hx   . Stomach cancer Neg Hx     History   Social History  . Marital Status: Married    Spouse Name: N/A  . Number of Children: N/A  . Years of Education: N/A   Social History Main Topics  . Smoking status: Former Smoker    Quit date: 05/17/1994  . Smokeless tobacco: Never Used  . Alcohol Use: Yes     Comment: rare  . Drug Use: No  . Sexual Activity: Yes    Birth  Control/ Protection: None   Other Topics Concern  . None   Social History Narrative   No ets   Regular exercise- thinking about yoga  As per dr Ellene Route.    HH of 2    Pet dog puppy 6 months    Good sleep   G2P2    Outpatient Encounter Prescriptions as of 07/17/2014  Medication Sig  . amLODipine (NORVASC) 10 MG tablet Take 1 tablet (10 mg total) by mouth daily.  . bisacodyl (DULCOLAX) 5 MG EC tablet Take 15 mg by mouth daily as needed for moderate constipation.  . carvedilol (COREG) 3.125 MG tablet Take 1 tablet (3.125 mg total) by mouth 2 (two) times daily with a meal.  . cholecalciferol (VITAMIN D) 1000 UNITS tablet Take 1,000 Units by mouth daily.  . clonazePAM (KLONOPIN) 0.5 MG tablet Take 1 tablet (0.5 mg total) by mouth 2 (two) times daily. (Patient taking differently: Take 0.5 mg by mouth daily. )  . Cyanocobalamin  (VITAMIN B-12) 5000 MCG SUBL Place 1 tablet under the tongue daily.  Marland Kitchen esomeprazole (NEXIUM) 40 MG capsule Take 1 capsule (40 mg total) by mouth daily before breakfast. (Patient taking differently: Take 40 mg by mouth as needed (for heartburn). )  . Multiple Vitamin (MULTIVITAMIN WITH MINERALS) TABS tablet Take 1 tablet by mouth daily.  . rosuvastatin (CRESTOR) 10 MG tablet Take 1 tablet (10 mg total) by mouth daily.  . [DISCONTINUED] amLODipine (NORVASC) 10 MG tablet Take 1 tablet (10 mg total) by mouth daily.  . [DISCONTINUED] carvedilol (COREG) 3.125 MG tablet Take 1 tablet (3.125 mg total) by mouth 2 (two) times daily with a meal.  . clonazePAM (KLONOPIN) 0.5 MG tablet Take 1 tablet (0.5 mg total) by mouth 2 (two) times daily as needed for anxiety.  . [DISCONTINUED] antiseptic oral rinse (CPC / CETYLPYRIDINIUM CHLORIDE 0.05%) 0.05 % LIQD solution 7 mLs by Mouth Rinse route 2 times daily at 12 noon and 4 pm.  . [DISCONTINUED] sodium chloride 0.9 % infusion Inject 75 mLs into the vein continuous.    EXAM:  BP 128/66 mmHg  Temp(Src) 97.6 F (36.4 C) (Oral)  Ht 4\' 11"  (1.499 m)  Wt 135 lb 8 oz (61.462 kg)  BMI 27.35 kg/m2  Body mass index is 27.35 kg/(m^2).  GENERAL: vitals reviewed and listed above, alert, oriented, appears well hydrated and in no acute distress she is well groomed appears to be cognitively intact. Normal speech and thought process HEENT: atraumatic, conjunctiva  clear, no obvious abnormalities on inspection of external nose and ears   NECK: no obvious masses on inspection palpation  LUNGS: clear to auscultation bilaterally, no wheezes, rales or rhonchi, good air movement CV: HRRR, no clubbing cyanosis or  peripheral edema nl cap refill  Abdomen soft without obvious tenderness that is localized \\L  sounds are present MS: moves all extremities without noticeable focal  abnormality PSYCH: pleasant and cooperative, no obvious depression or anxiety Hg 12.4 plt 635 ct  1.72 Lab Results  Component Value Date   WBC 10.8* 07/11/2014   HGB 11.6* 07/11/2014   HCT 36.9 07/11/2014   PLT 912* 07/11/2014   GLUCOSE 80 07/11/2014   CHOL 142 06/12/2014   TRIG 235* 06/12/2014   HDL 32* 06/12/2014   LDLDIRECT 214.5 03/28/2008   LDLCALC 63 06/12/2014   ALT 16 07/11/2014   AST 18 07/11/2014   NA 139 07/11/2014   K 3.6 07/11/2014   CL 102 07/11/2014   CREATININE 1.04 07/11/2014   BUN  9 07/11/2014   CO2 26 07/11/2014   TSH 4.575* 06/13/2014   INR 1.06 07/07/2014   Wt Readings from Last 3 Encounters:  07/17/14 135 lb 8 oz (61.462 kg)  07/07/14 138 lb 11.2 oz (62.914 kg)  06/19/14 139 lb 8 oz (63.277 kg)   Reviewed some labs through care everywhere anemia is improved.  ASSESSMENT AND PLAN:  Discussed the following assessment and plan:  Renal cell carcinoma, left  Elevated TSH  Medication management  Adjustment disorder with anxious mood - Treated with Klonopin twice a day by her surgeon, asks for PCP to prescribe this medicineconside other if needed risk benefit disc   Essential hypertension - continue on curent med with changes   Renal insufficiency Total visit 54mins > 50% spent counseling and coordinating care   -Patient advised to return or notify health care team  if symptoms worsen ,persist or new concerns arise.  Patient Instructions    BP Readings from Last 3 Encounters:  07/17/14 128/66  07/11/14 136/59  06/19/14 132/70  Benzodiazepams  Can be habit forming .    And done treated depressive sx  Just  for anxiety . Ok to take with caution . Renew bp medication Need TSH with next blood test ( abnormal  TSH thyroid tests)  Stay on newer BP medications       Mariann Laster K. Caton Popowski M.D. I

## 2014-07-23 ENCOUNTER — Encounter: Payer: Self-pay | Admitting: *Deleted

## 2014-07-30 ENCOUNTER — Ambulatory Visit (INDEPENDENT_AMBULATORY_CARE_PROVIDER_SITE_OTHER): Payer: BLUE CROSS/BLUE SHIELD | Admitting: Neurology

## 2014-07-30 ENCOUNTER — Encounter: Payer: Self-pay | Admitting: Neurology

## 2014-07-30 ENCOUNTER — Encounter: Payer: Self-pay | Admitting: Internal Medicine

## 2014-07-30 VITALS — BP 100/58 | HR 94 | Ht 59.0 in | Wt 135.5 lb

## 2014-07-30 DIAGNOSIS — I6783 Posterior reversible encephalopathy syndrome: Secondary | ICD-10-CM

## 2014-07-30 DIAGNOSIS — C649 Malignant neoplasm of unspecified kidney, except renal pelvis: Secondary | ICD-10-CM

## 2014-07-30 DIAGNOSIS — I10 Essential (primary) hypertension: Secondary | ICD-10-CM

## 2014-07-30 DIAGNOSIS — C7931 Secondary malignant neoplasm of brain: Secondary | ICD-10-CM

## 2014-07-30 NOTE — Patient Instructions (Signed)
Clinically, you are doing well and your exam is normal.  I would repeat a CT of the head.  The main goal is to keep the blood pressure controlled as much as possible.

## 2014-07-30 NOTE — Progress Notes (Signed)
NEUROLOGY CONSULTATION NOTE  Kendra SIMONIAN MRN: [id number] DOB: 1950/12/19  Referring provider: Dr. Regis Bill Primary care provider: Dr. Regis Bill  Reason for consult:  PRES  HISTORY OF PRESENT ILLNESS: Kendra Mcfarland is a 64 year old right-handed woman with hypertension, hyperlipidemia, spinal stenosis, anxiety and renal cell carcinoma status post nephrectomy and splenectomy who presents for recent PRES.  Records, labs and CT, MRI and MRA of head reviewed.  She is accompanied by her husband who provides some history.  She underwent nephrectomy and splenectomy for renal cell carcinoma at [facility] on 05/31/14.  Afterwards, she developed a UTI and was treated with doxycycline.  She was admitted to [facility] on 06/09/14 after presenting with visual disturbance and headache that progressively worsened over the course of the day.  She exhibited left hemineglect as her husband states she wasn't moving her left arm but she didn't think there was a problem.  At one point, her systolic blood pressure was in the low 200s and later dropped to 50s.  CT of the head revealed vasogenic edema posteriorly and possible mass lesion in the right parietal lobe, consistent with metastatic disease.  MRI and MRA of the brain showed patchy enhancement, most noticeable on the left, which appeared suggestive of PRES.  She did not have any seizures.  She was treated with decadron.  She clinically improved.  Etiology of the PRES was uncertain but possible connection to recent blood transfusion and decreased renal function and hypertension are possible triggers.  Blood pressure medications were changed.  She had home PT, which has greatly improved her coordination and balance.  Vision has resolved.  She has had no recurrence of headache and blood pressure has been controlled.  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  She is concerned as this medication causes elevated blood pressure and  may cause PRES.  However, her oncologists said there are no other options.  She has had extreme anxiety.  PAST MEDICAL HISTORY: Past Medical History  Diagnosis Date  . Anemia     years ago  . Transfusion history     before her hysterectomy  . Anxiety   . Hyperlipidemia   . Hypertension   . Spinal stenosis   . Spondylosis     degenerative adult  . Bulging lumbar disc   . MONONUCLEOSIS 08/14/2007    Qualifier: Diagnosis of  Problem Stop Reason: Removed By: Hulan Saas, CMA (AAMA), Quita Skye   . Vertigo   . Iron disorder     excessive iron storage   . Cancer     PAST SURGICAL HISTORY: Past Surgical History  Procedure Laterality Date  . Inguinal hernia repair      at birth  . Cesarean section      x 2  . Appendectomy    . Abdominal hysterectomy      bleeding fibroid tumors has ovaries  . Vein protrusion in head as a child    . Blood transfusion from vaginal bleeding      MEDICATIONS: Current Outpatient Prescriptions on File Prior to Visit  Medication Sig Dispense Refill  . amLODipine (NORVASC) 10 MG tablet Take 1 tablet (10 mg total) by mouth daily. 30 tablet 5  . carvedilol (COREG) 3.125 MG tablet Take 1 tablet (3.125 mg total) by mouth 2 (two) times daily with a meal. 60 tablet 6  . cholecalciferol (VITAMIN D) 1000 UNITS tablet Take 1,000 Units by mouth daily.    Marland Kitchen  clonazePAM (KLONOPIN) 0.5 MG tablet Take 1 tablet (0.5 mg total) by mouth 2 (two) times daily as needed for anxiety. 60 tablet 0  . Cyanocobalamin (VITAMIN B-12) 5000 MCG SUBL Place 1 tablet under the tongue daily.    . Multiple Vitamin (MULTIVITAMIN WITH MINERALS) TABS tablet Take 1 tablet by mouth daily.    . rosuvastatin (CRESTOR) 10 MG tablet Take 1 tablet (10 mg total) by mouth daily. 90 tablet 3   No current facility-administered medications on file prior to visit.    ALLERGIES: Allergies  Allergen Reactions  . Hydrocodone Nausea And Vomiting  . Morphine And Related Nausea And Vomiting  . Oxycodone  Nausea And Vomiting  . Tramadol Nausea And Vomiting  . Naproxen Sodium Hives, Itching and Rash    Can take advil   . Penicillins Hives, Itching and Rash  . Sulfonamide Derivatives Hives, Itching and Rash    dypsnea also    FAMILY HISTORY: Family History  Problem Relation Age of Onset  . Hypertension Mother   . Arthritis Mother   . Hyperlipidemia Mother   . Memory loss Mother 41    short term  . Other Mother 66    angioplasty  . Arthritis Father   . Hypertension Father   . Heart attack Father   . Hyperthyroidism Sister   . Anemia Sister   . Hypothyroidism Sister   . Hyperlipidemia Sister   . Colon cancer Neg Hx   . Esophageal cancer Neg Hx   . Rectal cancer Neg Hx   . Stomach cancer Neg Hx     SOCIAL HISTORY: History   Social History  . Marital Status: Married    Spouse Name: N/A  . Number of Children: N/A  . Years of Education: N/A   Occupational History  . Not on file.   Social History Main Topics  . Smoking status: Former Smoker    Quit date: 05/17/1994  . Smokeless tobacco: Never Used  . Alcohol Use: Yes     Comment: rare  . Drug Use: No  . Sexual Activity: Yes    Birth Control/ Protection: None   Other Topics Concern  . Not on file   Social History Narrative   No ets   Regular exercise- thinking about yoga  As per dr Ellene Route.    HH of 2    Pet dog puppy 6 months    Good sleep   G2P2    REVIEW OF SYSTEMS: Constitutional: No fevers, chills, or sweats, no generalized fatigue, change in appetite Eyes: No visual changes, double vision, eye pain Ear, nose and throat: No hearing loss, ear pain, nasal congestion, sore throat Cardiovascular: No chest pain, palpitations Respiratory:  No shortness of breath at rest or with exertion, wheezes GastrointestinaI: No nausea, vomiting, diarrhea, abdominal pain, fecal incontinence Genitourinary:  No dysuria, urinary retention or frequency Musculoskeletal:  No neck pain, back pain Integumentary: No rash,  pruritus, skin lesions Neurological: as above Psychiatric: Anxiety Endocrine: No palpitations, fatigue, diaphoresis, mood swings, change in appetite, change in weight, increased thirst Hematologic/Lymphatic:  No anemia, purpura, petechiae. Allergic/Immunologic: no itchy/runny eyes, nasal congestion, recent allergic reactions, rashes  PHYSICAL EXAM: Filed Vitals:   07/30/14 0829  BP: 100/58  Pulse: 94   General: No acute distress Head:  Normocephalic/atraumatic Eyes:  fundi unremarkable, without vessel changes, exudates, hemorrhages or papilledema. Neck: supple, no paraspinal tenderness, full range of motion Back: No paraspinal tenderness Heart: regular rate and rhythm Lungs: Clear to auscultation bilaterally. Vascular: No  carotid bruits. Neurological Exam: Mental status: alert and oriented to person, place, and time, recent and remote memory intact, fund of knowledge intact, attention and concentration intact, speech fluent and not dysarthric, no neglect, language intact. Cranial nerves: CN I: not tested CN II: pupils equal, round and reactive to light, visual fields intact, fundi unremarkable, without vessel changes, exudates, hemorrhages or papilledema. CN III, IV, VI:  full range of motion, no nystagmus, no ptosis CN V: facial sensation intact CN VII: upper and lower face symmetric CN VIII: hearing intact CN IX, X: gag intact, uvula midline CN XI: sternocleidomastoid and trapezius muscles intact CN XII: tongue midline Bulk & Tone: normal, no fasciculations. Motor:  5/5 throughout Sensation:  Temperature and vibration intact. Deep Tendon Reflexes:  2+ throughout, toes downgoing Finger to nose testing:  No dysmetria Heel to shin:  No dysmetria Gait:  Normal station and stride.  Able to turn and walk in tandem. Romberg negative.  IMPRESSION: Posterior reversible encephalopathy syndrome, improved.  Clinically normal and blood pressure controlled. Metastatic renal cell  carcinoma Hypertension Anxiety  PLAN: 1.  Will get CT of head to look for radiologic resolution 2.  She is aware that the Voltrient may cause PRES, however it has been explained that it is the only treatment option.  The only recommendation I can make is to try and achieve optimal blood pressure control. 3.  Follow up in 3 months.  Thank you for allowing me to take part in the care of this patient.  Metta Clines, DO  CC:  Shanon Ace, MD

## 2014-08-02 ENCOUNTER — Ambulatory Visit (HOSPITAL_COMMUNITY)
Admission: RE | Admit: 2014-08-02 | Discharge: 2014-08-02 | Disposition: A | Discharge status: Home / Self Care | Payer: BLUE CROSS/BLUE SHIELD | Source: Ambulatory Visit | Attending: Neurology | Admitting: Neurology

## 2014-08-02 ENCOUNTER — Telehealth: Payer: Self-pay | Admitting: *Deleted

## 2014-08-02 DIAGNOSIS — C7931 Secondary malignant neoplasm of brain: Secondary | ICD-10-CM | POA: Diagnosis not present

## 2014-08-02 DIAGNOSIS — I6783 Posterior reversible encephalopathy syndrome: Secondary | ICD-10-CM | POA: Diagnosis present

## 2014-08-02 DIAGNOSIS — C649 Malignant neoplasm of unspecified kidney, except renal pelvis: Secondary | ICD-10-CM | POA: Diagnosis not present

## 2014-08-02 DIAGNOSIS — I10 Essential (primary) hypertension: Secondary | ICD-10-CM | POA: Diagnosis not present

## 2014-08-02 NOTE — Telephone Encounter (Signed)
Patient is aware that  PRES has resolved

## 2014-08-02 NOTE — Telephone Encounter (Signed)
-----   Message from Pieter Partridge, DO sent at 08/02/2014  2:03 PM EDT ----- CT shows that the PRES has resolved. ----- Message -----    From: Rad Results In Interface    Sent: 08/02/2014   1:38 PM      To: Pieter Partridge, DO

## 2014-08-05 ENCOUNTER — Other Ambulatory Visit: Payer: Self-pay | Admitting: Internal Medicine

## 2014-08-06 NOTE — Telephone Encounter (Signed)
Ok to refill x 1 year ldl at goal

## 2014-08-06 NOTE — Telephone Encounter (Signed)
Lipid done in hospital in Jan.  Abnormal.  Please advise.

## 2014-08-07 ENCOUNTER — Encounter: Payer: Self-pay | Admitting: Internal Medicine

## 2014-08-07 NOTE — Telephone Encounter (Signed)
Sent to the pharmacy by e-scribe. 

## 2014-09-13 ENCOUNTER — Other Ambulatory Visit: Payer: BC Managed Care – PPO

## 2014-09-19 ENCOUNTER — Ambulatory Visit: Payer: BC Managed Care – PPO | Admitting: Internal Medicine

## 2014-10-07 ENCOUNTER — Telehealth: Payer: Self-pay | Admitting: Family Medicine

## 2014-10-07 ENCOUNTER — Other Ambulatory Visit: Payer: Self-pay | Admitting: Family Medicine

## 2014-10-07 DIAGNOSIS — E039 Hypothyroidism, unspecified: Secondary | ICD-10-CM | POA: Insufficient documentation

## 2014-10-07 NOTE — Telephone Encounter (Signed)
Pt has been started on levothyroxine by Dr. Berline Lopes [facility] oncologist) and stated she should have her TSH re checked in 2 weeks for hypothyroidism.  Please advise.  Thanks! Pt has made follow up for 12/16/14.

## 2014-10-12 NOTE — Telephone Encounter (Signed)
Yes order tsh for time frame and make sure we have updated med list   Thyroid dose . Thanks

## 2014-10-15 ENCOUNTER — Other Ambulatory Visit: Payer: Self-pay | Admitting: Family Medicine

## 2014-10-15 DIAGNOSIS — E038 Other specified hypothyroidism: Secondary | ICD-10-CM

## 2014-10-15 NOTE — Telephone Encounter (Signed)
Pt called back to fu on lab order. Per note, it is ok to have pt come in for hwe tsh. scheduled pt and can you  Put the order in"?  Pt confirmed 50 MCG synthroid  Pt will be her tomorrow  For lab

## 2014-10-15 NOTE — Telephone Encounter (Signed)
Order placed in the system. 

## 2014-10-16 ENCOUNTER — Other Ambulatory Visit (INDEPENDENT_AMBULATORY_CARE_PROVIDER_SITE_OTHER): Payer: BLUE CROSS/BLUE SHIELD

## 2014-10-16 DIAGNOSIS — E038 Other specified hypothyroidism: Secondary | ICD-10-CM

## 2014-10-16 DIAGNOSIS — Z01812 Encounter for preprocedural laboratory examination: Secondary | ICD-10-CM | POA: Diagnosis not present

## 2014-10-16 LAB — BASIC METABOLIC PANEL
BUN: 21 mg/dL (ref 6–23)
CHLORIDE: 106 meq/L (ref 96–112)
CO2: 29 mEq/L (ref 19–32)
Calcium: 9.1 mg/dL (ref 8.4–10.5)
Creatinine, Ser: 1.29 mg/dL — ABNORMAL HIGH (ref 0.40–1.20)
GFR: 44.13 mL/min — ABNORMAL LOW (ref >60.00)
Glucose, Bld: 79 mg/dL (ref 70–99)
Potassium: 5.4 mEq/L — ABNORMAL HIGH (ref 3.5–5.1)
SODIUM: 140 meq/L (ref 135–145)

## 2014-10-16 LAB — TSH: TSH: 2.46 u[IU]/mL (ref 0.35–4.50)

## 2014-10-17 ENCOUNTER — Encounter: Payer: Self-pay | Admitting: Internal Medicine

## 2014-10-17 NOTE — Telephone Encounter (Signed)
Misty lease see the result note  Already reviewed  Repeat tsh in 3-4 months

## 2014-10-19 ENCOUNTER — Encounter: Payer: Self-pay | Admitting: Internal Medicine

## 2014-10-24 ENCOUNTER — Encounter: Payer: Self-pay | Admitting: Internal Medicine

## 2014-10-24 ENCOUNTER — Ambulatory Visit (INDEPENDENT_AMBULATORY_CARE_PROVIDER_SITE_OTHER): Payer: BLUE CROSS/BLUE SHIELD | Admitting: Internal Medicine

## 2014-10-24 VITALS — BP 116/64 | Temp 98.1°F | Ht 59.0 in | Wt 137.8 lb

## 2014-10-24 DIAGNOSIS — E039 Hypothyroidism, unspecified: Secondary | ICD-10-CM

## 2014-10-24 DIAGNOSIS — L659 Nonscarring hair loss, unspecified: Secondary | ICD-10-CM

## 2014-10-24 NOTE — Progress Notes (Signed)
Pre visit review using our clinic review tool, if applicable. No additional management support is needed unless otherwise documented below in the visit note.  Chief Complaint  Patient presents with  . Hair Thinning    HPI: Patient Kendra Mcfarland  comes in today for SDA for  new problem evaluation.  Wit husband ( see my chart communications)  r regarding new onset of increased hair loss.   She is under treatment evaluation for renal cancer with pulmonary metastases. Her TSH was mildly elevated borderline 4 months ago. When repeated at [facility] in May it was consistent with primary hypothyroidism elevated TSH in the 16 range and low free T4. She was begun on 50 g of levothyroxine and told to follow-up with her PCP. Her repeat TSH was in range done a week ago. At the beginning of June she noted increased hair shedding that was quite severe without other associated symptoms her lateral eyebrows have decreased she had cold intolerance and dry skin.  No other fever new illness new medication. She is to go back for a follow-up CT scan liver tests to see if she can go through another course of going back on the chemotherapy.votrient.    ROS: See pertinent positives and negatives per HPI. No current chest pain shortness of breath does feel tired  Past Medical History  Diagnosis Date  . Anemia     years ago  . Transfusion history     before her hysterectomy  . Anxiety   . Hyperlipidemia   . Hypertension   . Spinal stenosis   . Spondylosis     degenerative adult  . Bulging lumbar disc   . MONONUCLEOSIS 08/14/2007    Qualifier: Diagnosis of  Problem Stop Reason: Removed By: Hulan Saas, CMA (AAMA), Quita Skye   . Vertigo   . Iron disorder     excessive iron storage   . Cancer     Family History  Problem Relation Age of Onset  . Hypertension Mother   . Arthritis Mother   . Hyperlipidemia Mother   . Memory loss Mother 72    short term  . Other Mother 63    angioplasty  .  Arthritis Father   . Hypertension Father   . Heart attack Father   . Hyperthyroidism Sister   . Anemia Sister   . Hypothyroidism Sister   . Hyperlipidemia Sister   . Colon cancer Neg Hx   . Esophageal cancer Neg Hx   . Rectal cancer Neg Hx   . Stomach cancer Neg Hx     History   Social History  . Marital Status: Married    Spouse Name: N/A  . Number of Children: N/A  . Years of Education: N/A   Social History Main Topics  . Smoking status: Former Smoker    Quit date: 05/17/1994  . Smokeless tobacco: Never Used  . Alcohol Use: Yes     Comment: rare  . Drug Use: No  . Sexual Activity: Yes    Birth Control/ Protection: None   Other Topics Concern  . None   Social History Narrative   No ets   Regular exercise- thinking about yoga  As per dr Ellene Route.    HH of 2    Pet dog puppy 6 months    Good sleep   G2P2    Outpatient Prescriptions Prior to Visit  Medication Sig Dispense Refill  . amLODipine (NORVASC) 10 MG tablet Take 1 tablet (10 mg total)  by mouth daily. 30 tablet 5  . carvedilol (COREG) 3.125 MG tablet Take 1 tablet (3.125 mg total) by mouth 2 (two) times daily with a meal. 60 tablet 6  . clonazePAM (KLONOPIN) 0.5 MG tablet Take 1 tablet (0.5 mg total) by mouth 2 (two) times daily as needed for anxiety. 60 tablet 0  . CRESTOR 10 MG tablet TAKE 1 TABLET BY MOUTH DAILY 90 tablet 3  . levothyroxine (SYNTHROID, LEVOTHROID) 50 MCG tablet Take 50 mcg by mouth daily.   3  . cholecalciferol (VITAMIN D) 1000 UNITS tablet Take 1,000 Units by mouth daily.    . Cyanocobalamin (VITAMIN B-12) 5000 MCG SUBL Place 1 tablet under the tongue daily.    . Multiple Vitamin (MULTIVITAMIN WITH MINERALS) TABS tablet Take 1 tablet by mouth daily.     No facility-administered medications prior to visit.     EXAM:  BP 116/64 mmHg  Temp(Src) 98.1 F (36.7 C) (Oral)  Ht 4\' 11"  (1.499 m)  Wt 137 lb 12.8 oz (62.506 kg)  BMI 27.82 kg/m2  Body mass index is 27.82  kg/(m^2).  GENERAL: vitals reviewed and listed above, alert, oriented, appears well hydrated and in no acute distress well-groomed normal conversation with obvious thinning hair. HEENT: atraumatic, conjunctiva  clear, no obvious abnormalities on inspection of external nose and ears  Scalp without obvious scarring rash or dermatitis.  NECK: no obvious masses on inspection palpation  MS: moves all extremities without noticeable focal  abnormality PSYCH: pleasant and cooperative, no obvious depression or anxiety tsh  At [facility] 5 19 free t4 0.55 and tsh 16.840 Lab Results  Component Value Date   TSH 2.46 10/16/2014    ASSESSMENT AND PLAN:  Discussed the following assessment and plan:  Loss of hair poss teolgen effuvium - ? non scarring? noticeable  thinning suspect teolegen and or thyroid disease stay on synthroid  get opinion from derm   Hypothyroidism, unspecified hypothyroidism type - high tsh and low t4 ducmented [facility] in May . symptomatic cold loss of eyebrow hair dry skin .  Many factors could cause her hair thinning shedding suspecting T. effluvium many of her systemic symptoms were more consistent with hypothyroidism so continuing thyroid supplementation treatment would be in order. However would get dermatology opinion as to the diagnosis and if there are other interventions will be more appropriate besides continuing to treat her thyroid dysfunction and follow-up. They will contact dr Denna Haggard office  For appt  And we will send notes and labs  -Patient advised to return or notify health care team  if symptoms worsen ,persist or new concerns arise.  Patient Instructions  This could be telogen effluvium  and the thyroid disease itself . Stay on the thyroid med  At this time.  See dermatologist .  To confirm the dx to see if  Other  interventions are appropriate.  Will send notes to dr Onalee Hua office . Contact us if we can help.     Standley Brooking. Natalyia Innes M.D.

## 2014-10-24 NOTE — Patient Instructions (Addendum)
This could be telogen effluvium  and the thyroid disease itself . Stay on the thyroid med  At this time.  See dermatologist .  To confirm the dx to see if  Other  interventions are appropriate.  Will send notes to dr Onalee Hua office . Contact us if we can help.

## 2014-11-05 ENCOUNTER — Ambulatory Visit: Payer: BLUE CROSS/BLUE SHIELD | Admitting: Neurology

## 2014-11-10 ENCOUNTER — Encounter: Payer: Self-pay | Admitting: Internal Medicine

## 2014-11-10 DIAGNOSIS — K8020 Calculus of gallbladder without cholecystitis without obstruction: Secondary | ICD-10-CM

## 2014-11-11 NOTE — Telephone Encounter (Signed)
ruq pain after eating  can certainly be from gall stones  . If felt from that then usually do surgery consult  For laparascopyic removal however in your situation  With previous cancer surgery becomes  More complicated   I suggest we do a surgery consult  But   may consider doing it at [facility]  .Where your cancer team is .  If so MIsty see if we can do asap consult with surgery at [facility] .

## 2014-11-18 ENCOUNTER — Emergency Department (HOSPITAL_COMMUNITY): Payer: BLUE CROSS/BLUE SHIELD

## 2014-11-18 ENCOUNTER — Encounter (HOSPITAL_COMMUNITY): Payer: Self-pay | Admitting: Emergency Medicine

## 2014-11-18 ENCOUNTER — Emergency Department (HOSPITAL_COMMUNITY)
Admission: EM | Admit: 2014-11-18 | Discharge: 2014-11-18 | Disposition: A | Discharge status: Home / Self Care | Payer: BLUE CROSS/BLUE SHIELD | Attending: Emergency Medicine | Admitting: Emergency Medicine

## 2014-11-18 DIAGNOSIS — F419 Anxiety disorder, unspecified: Secondary | ICD-10-CM | POA: Insufficient documentation

## 2014-11-18 DIAGNOSIS — Z862 Personal history of diseases of the blood and blood-forming organs and certain disorders involving the immune mechanism: Secondary | ICD-10-CM | POA: Insufficient documentation

## 2014-11-18 DIAGNOSIS — R0789 Other chest pain: Secondary | ICD-10-CM | POA: Diagnosis not present

## 2014-11-18 DIAGNOSIS — Z9049 Acquired absence of other specified parts of digestive tract: Secondary | ICD-10-CM | POA: Diagnosis not present

## 2014-11-18 DIAGNOSIS — Z880 Allergy status to penicillin: Secondary | ICD-10-CM | POA: Diagnosis not present

## 2014-11-18 DIAGNOSIS — Z8619 Personal history of other infectious and parasitic diseases: Secondary | ICD-10-CM | POA: Insufficient documentation

## 2014-11-18 DIAGNOSIS — R1011 Right upper quadrant pain: Secondary | ICD-10-CM | POA: Insufficient documentation

## 2014-11-18 DIAGNOSIS — I10 Essential (primary) hypertension: Secondary | ICD-10-CM | POA: Diagnosis not present

## 2014-11-18 DIAGNOSIS — Z79899 Other long term (current) drug therapy: Secondary | ICD-10-CM | POA: Diagnosis not present

## 2014-11-18 DIAGNOSIS — Z90710 Acquired absence of both cervix and uterus: Secondary | ICD-10-CM | POA: Diagnosis not present

## 2014-11-18 DIAGNOSIS — R109 Unspecified abdominal pain: Secondary | ICD-10-CM

## 2014-11-18 DIAGNOSIS — Z859 Personal history of malignant neoplasm, unspecified: Secondary | ICD-10-CM | POA: Insufficient documentation

## 2014-11-18 DIAGNOSIS — Z87891 Personal history of nicotine dependence: Secondary | ICD-10-CM | POA: Insufficient documentation

## 2014-11-18 DIAGNOSIS — M479 Spondylosis, unspecified: Secondary | ICD-10-CM | POA: Insufficient documentation

## 2014-11-18 DIAGNOSIS — J159 Unspecified bacterial pneumonia: Secondary | ICD-10-CM | POA: Insufficient documentation

## 2014-11-18 DIAGNOSIS — R911 Solitary pulmonary nodule: Secondary | ICD-10-CM | POA: Diagnosis not present

## 2014-11-18 DIAGNOSIS — J189 Pneumonia, unspecified organism: Secondary | ICD-10-CM

## 2014-11-18 DIAGNOSIS — E785 Hyperlipidemia, unspecified: Secondary | ICD-10-CM | POA: Diagnosis not present

## 2014-11-18 DIAGNOSIS — R0781 Pleurodynia: Secondary | ICD-10-CM

## 2014-11-18 LAB — URINALYSIS, ROUTINE W REFLEX MICROSCOPIC
Bilirubin Urine: NEGATIVE
GLUCOSE, UA: NEGATIVE mg/dL
KETONES UR: NEGATIVE mg/dL
LEUKOCYTES UA: NEGATIVE
Nitrite: NEGATIVE
PH: 5.5 (ref 5.0–8.0)
PROTEIN: NEGATIVE mg/dL
Specific Gravity, Urine: 1.021 (ref 1.005–1.030)
Urobilinogen, UA: 0.2 mg/dL (ref 0.0–1.0)

## 2014-11-18 LAB — COMPREHENSIVE METABOLIC PANEL
ALT: 19 U/L (ref 14–54)
AST: 25 U/L (ref 15–41)
Albumin: 2.9 g/dL — ABNORMAL LOW (ref 3.5–5.0)
Alkaline Phosphatase: 103 U/L (ref 38–126)
Anion gap: 10 (ref 5–15)
BUN: 19 mg/dL (ref 6–20)
CO2: 26 mmol/L (ref 22–32)
CREATININE: 1.35 mg/dL — AB (ref 0.44–1.00)
Calcium: 8.7 mg/dL — ABNORMAL LOW (ref 8.9–10.3)
Chloride: 101 mmol/L (ref 101–111)
GFR calc Af Amer: 47 mL/min — ABNORMAL LOW (ref >60)
GFR calc non Af Amer: 41 mL/min — ABNORMAL LOW (ref >60)
GLUCOSE: 104 mg/dL — AB (ref 65–99)
Potassium: 4.7 mmol/L (ref 3.5–5.1)
Sodium: 137 mmol/L (ref 135–145)
TOTAL PROTEIN: 7.5 g/dL (ref 6.5–8.1)
Total Bilirubin: 0.6 mg/dL (ref 0.3–1.2)

## 2014-11-18 LAB — CBC WITH DIFFERENTIAL/PLATELET
Basophils Absolute: 0.1 10*3/uL (ref 0.0–0.1)
Basophils Relative: 1 % (ref 0–1)
EOS ABS: 0.2 10*3/uL (ref 0.0–0.7)
Eosinophils Relative: 2 % (ref 0–5)
HCT: 42.4 % (ref 36.0–46.0)
Hemoglobin: 13.8 g/dL (ref 12.0–15.0)
LYMPHS ABS: 2.6 10*3/uL (ref 0.7–4.0)
Lymphocytes Relative: 24 % (ref 12–46)
MCH: 30.1 pg (ref 26.0–34.0)
MCHC: 32.5 g/dL (ref 30.0–36.0)
MCV: 92.4 fL (ref 78.0–100.0)
MONO ABS: 1 10*3/uL (ref 0.1–1.0)
Monocytes Relative: 10 % (ref 3–12)
NEUTROS PCT: 63 % (ref 43–77)
Neutro Abs: 6.7 10*3/uL (ref 1.7–7.7)
Platelets: 780 10*3/uL — ABNORMAL HIGH (ref 150–400)
RBC: 4.59 MIL/uL (ref 3.87–5.11)
RDW: 16.2 % — ABNORMAL HIGH (ref 11.5–15.5)
WBC: 10.6 10*3/uL — ABNORMAL HIGH (ref 4.0–10.5)

## 2014-11-18 LAB — URINE MICROSCOPIC-ADD ON

## 2014-11-18 MED ORDER — DOXYCYCLINE HYCLATE 100 MG PO CPEP: 100.0000 mg | ORAL_CAPSULE | Freq: Two times a day (BID) | ORAL | Status: DC

## 2014-11-18 MED ORDER — FENTANYL CITRATE (PF) 100 MCG/2ML IJ SOLN
50.0000 ug | Freq: Once | INTRAMUSCULAR | Status: AC
  Administered 2014-11-18: 50 ug via INTRAVENOUS

## 2014-11-18 MED ORDER — CEFUROXIME AXETIL 500 MG PO TABS: 500.0000 mg | ORAL_TABLET | Freq: Two times a day (BID) | ORAL | Status: DC

## 2014-11-18 MED ORDER — MORPHINE SULFATE 4 MG/ML IJ SOLN
4.0000 mg | Freq: Once | INTRAMUSCULAR | Status: AC
  Administered 2014-11-18: 4 mg via INTRAVENOUS
  Filled 2014-11-18: qty 1

## 2014-11-18 MED ORDER — CEFUROXIME AXETIL 250 MG PO TABS
250.0000 mg | ORAL_TABLET | Freq: Two times a day (BID) | ORAL | Status: DC
  Administered 2014-11-18: 250 mg via ORAL
  Filled 2014-11-18 (×2): qty 1

## 2014-11-18 MED ORDER — PROMETHAZINE HCL 25 MG/ML IJ SOLN
25.0000 mg | Freq: Once | INTRAMUSCULAR | Status: AC
  Administered 2014-11-18: 25 mg via INTRAVENOUS
  Filled 2014-11-18: qty 1

## 2014-11-18 MED ORDER — ONDANSETRON HCL 4 MG/2ML IJ SOLN
4.0000 mg | Freq: Once | INTRAMUSCULAR | Status: AC
  Administered 2014-11-18: 4 mg via INTRAVENOUS
  Filled 2014-11-18: qty 2

## 2014-11-18 MED ORDER — DOXYCYCLINE HYCLATE 100 MG PO TABS
100.0000 mg | ORAL_TABLET | Freq: Two times a day (BID) | ORAL | Status: DC
  Administered 2014-11-18: 100 mg via ORAL
  Filled 2014-11-18: qty 1

## 2014-11-18 MED ORDER — PROMETHAZINE HCL 25 MG PO TABS: 25.0000 mg | ORAL_TABLET | Freq: Four times a day (QID) | ORAL | Status: DC | PRN

## 2014-11-18 NOTE — ED Provider Notes (Signed)
  Physical Exam  BP 130/64 mmHg  Pulse 74  Temp(Src) 98 F (36.7 C) (Oral)  Resp 16  SpO2 95%  Physical Exam  ED Course  Procedures  MDM  Assuming care of patient from Dr. Doy Mince Patient in the ED for pleuritic pain affecting the R flank region. Pt has hx of renal CA s/p nephrectomy and R sided pulm mets on oral chemo meds for now. + hx of gall stones. Awaiting CT abdomen non contrast. Workup thus far is neg. Patient had no complains, no concerns from the nursing side. Will continue to monitor.    6:38 PM CT is + for ? PNA vs lung mass. + gall stones. She has cough, no fevers. PE suspicion is very low on my reassessment. I think the pain is related to pulm nodules, pt states that oncologist thought it was not nodules. Plan is to start CAP tx and for her to see her pcp or oncologist in a week. PE return precautions discussed, risk of iv contrast doesn't outweigh the benefits. Might benefit from repeat CT chest non contrast.  Varney Biles, MD 11/18/14 1840

## 2014-11-18 NOTE — ED Provider Notes (Signed)
CSN: [id number]     Arrival date & time 11/18/14  1311 History   First MD Initiated Contact with Patient 11/18/14 1327     Chief Complaint  Patient presents with  . Flank Pain     (Consider location/radiation/quality/duration/timing/severity/associated sxs/prior Treatment) Patient is a 64 y.o. female presenting with flank pain.  Flank Pain This is a new problem. Episode onset: 3 weeks ago intermittently.  constant for past 4 days. The problem occurs constantly. The problem has been gradually worsening. Pertinent negatives include no chest pain and no shortness of breath. Associated symptoms comments: Hurts to take deep breath.  No fevers.  Mild, nonproductive cough.  No hematuria.  No vomiting.  . Exacerbated by: lying on right side.   Nothing relieves the symptoms.    Past Medical History  Diagnosis Date  . Anemia     years ago  . Transfusion history     before her hysterectomy  . Anxiety   . Hyperlipidemia   . Hypertension   . Spinal stenosis   . Spondylosis     degenerative adult  . Bulging lumbar disc   . MONONUCLEOSIS 08/14/2007    Qualifier: Diagnosis of  Problem Stop Reason: Removed By: Hulan Saas, CMA (AAMA), Quita Skye   . Vertigo   . Iron disorder     excessive iron storage   . Cancer    Past Surgical History  Procedure Laterality Date  . Inguinal hernia repair      at birth  . Cesarean section      x 2  . Appendectomy    . Abdominal hysterectomy      bleeding fibroid tumors has ovaries  . Vein protrusion in head as a child    . Blood transfusion from vaginal bleeding     Family History  Problem Relation Age of Onset  . Hypertension Mother   . Arthritis Mother   . Hyperlipidemia Mother   . Memory loss Mother 37    short term  . Other Mother 58    angioplasty  . Arthritis Father   . Hypertension Father   . Heart attack Father   . Hyperthyroidism Sister   . Anemia Sister   . Hypothyroidism Sister   . Hyperlipidemia Sister   . Colon cancer Neg Hx   .  Esophageal cancer Neg Hx   . Rectal cancer Neg Hx   . Stomach cancer Neg Hx    History  Substance Use Topics  . Smoking status: Former Smoker    Quit date: 05/17/1994  . Smokeless tobacco: Never Used  . Alcohol Use: Yes     Comment: rare   OB History    No data available     Review of Systems  Respiratory: Negative for shortness of breath.   Cardiovascular: Negative for chest pain.  Genitourinary: Positive for flank pain.  All other systems reviewed and are negative.     Allergies  Buprenorphine hcl; Hydrocodone; Morphine and related; Oxycodone; Tramadol; Naproxen sodium; Penicillins; and Sulfonamide derivatives  Home Medications   Prior to Admission medications   Medication Sig Start Date End Date Taking? Authorizing Provider  acetaminophen (TYLENOL) 500 MG tablet Take 500 mg by mouth every 6 (six) hours as needed (For pain.).   Yes Historical Provider, MD  amLODipine (NORVASC) 10 MG tablet Take 1 tablet (10 mg total) by mouth daily. Patient taking differently: Take 10 mg by mouth daily with supper.  07/17/14  Yes Burnis Medin, MD  carvedilol (COREG) 3.125  MG tablet Take 1 tablet (3.125 mg total) by mouth 2 (two) times daily with a meal. 07/17/14  Yes Burnis Medin, MD  clonazePAM (KLONOPIN) 0.5 MG tablet Take 1 tablet (0.5 mg total) by mouth 2 (two) times daily as needed for anxiety. 07/17/14  Yes Burnis Medin, MD  diphenhydramine-acetaminophen (TYLENOL PM) 25-500 MG TABS Take 1 tablet by mouth at bedtime as needed (For sleep.).   Yes Historical Provider, MD  levothyroxine (SYNTHROID, LEVOTHROID) 50 MCG tablet Take 50 mcg by mouth daily.  10/03/14  Yes Historical Provider, MD  LORazepam (ATIVAN) 0.5 MG tablet Take 0.5 mg by mouth 3 (three) times daily as needed for anxiety.  09/12/14  Yes Historical Provider, MD  ondansetron (ZOFRAN) 4 MG tablet Take 4 mg by mouth every 8 (eight) hours as needed for nausea or vomiting.  09/20/14  Yes Historical Provider, MD  rosuvastatin  (CRESTOR) 10 MG tablet Take 10 mg by mouth daily with supper.   Yes Historical Provider, MD  VOTRIENT 200 MG tablet Take 400 mg by mouth at bedtime.  09/26/14  Yes Historical Provider, MD   BP 126/68 mmHg  Pulse 90  Temp(Src) 98 F (36.7 C) (Oral)  Resp 20  SpO2 97% Physical Exam  Constitutional: She is oriented to person, place, and time. She appears well-developed and well-nourished. No distress.  HENT:  Head: Normocephalic and atraumatic.  Mouth/Throat: Oropharynx is clear and moist.  Eyes: Conjunctivae are normal. Pupils are equal, round, and reactive to light. No scleral icterus.  Neck: Neck supple.  Cardiovascular: Normal rate, regular rhythm, normal heart sounds and intact distal pulses.   No murmur heard. Pulmonary/Chest: Effort normal. No stridor. No respiratory distress. She has decreased breath sounds in the right lower field. She has no rales.  Abdominal: Soft. Bowel sounds are normal. She exhibits no distension. There is tenderness (right lateral upper quadrant) in the right upper quadrant. There is CVA tenderness (right). There is negative Murphy's sign.  Musculoskeletal: Normal range of motion.  Neurological: She is alert and oriented to person, place, and time.  Skin: Skin is warm and dry. No rash noted.  Psychiatric: She has a normal mood and affect. Her behavior is normal.  Nursing note and vitals reviewed.   ED Course  Procedures (including critical care time) Labs Review Labs Reviewed - No data to display  Imaging Review No results found.   EKG Interpretation None      MDM   Final diagnoses:  Right flank pain   64 yo female with hx of RCC with mets to lung presenting with right flank pain, constantly worsening for past several days.    IV morphine for pain.  Labs, CT pending.  Care transferred.   Serita Grit, MD 11/20/14 [phone]

## 2014-11-18 NOTE — Discharge Instructions (Signed)
We suspect that your pain is from pneumonia or lung mass/nodules from the cancer. As discussed, the next step is to start antibiotics. See your doctor, pcp or cancer in 1 week. Return to the ER if the breathing gets worse, or you star having fevers.   Pneumonia, Adult Pneumonia is an infection of the lungs. It may be caused by a germ (virus or bacteria). Some types of pneumonia can spread easily from person to person. This can happen when you cough or sneeze. HOME CARE  Only take medicine as told by your doctor.  Take your medicine (antibiotics) as told. Finish it even if you start to feel better.  Do not smoke.  You may use a vaporizer or humidifier in your room. This can help loosen thick spit (mucus).  Sleep so you are almost sitting up (semi-upright). This helps reduce coughing.  Rest. A shot (vaccine) can help prevent pneumonia. Shots are often advised for:  People over 47 years old.  Patients on chemotherapy.  People with long-term (chronic) lung problems.  People with immune system problems. GET HELP RIGHT AWAY IF:   You are getting worse.  You cannot control your cough, and you are losing sleep.  You cough up blood.  Your pain gets worse, even with medicine.  You have a fever.  Any of your problems are getting worse, not better.  You have shortness of breath or chest pain. MAKE SURE YOU:   Understand these instructions.  Will watch your condition.  Will get help right away if you are not doing well or get worse. Document Released: 10/20/2007 Document Revised: 07/26/2011 Document Reviewed: 07/24/2010 [facility] Patient Information 2015 [place], [place]. This information is not intended to replace advice given to you by your health care provider. Make sure you discuss any questions you have with your health care provider.  Pulmonary Nodule  A pulmonary nodule is a small, round spot in your lung. It is usually found when pictures of your lungs are taken for  other reasons. Most pulmonary nodules are not cancerous and do not cause symptoms. Tests will be done to make sure the nodule is not cancerous. Pulmonary nodules that are not cancerous usually do not require treatment. HOME CARE   Only take medicine as told by your doctor.  Follow up with your doctor as told. GET HELP IF:  You have trouble breathing when doing activities.  You feel sick or more tired than normal.  You do not feel like eating.  You lose weight without trying to.  You have chills.  You have night sweats. GET HELP RIGHT AWAY IF:  You cannot catch your breath.  You start making whistling sounds when breathing (wheezing).  You have a cough that does not go away.  You cough up blood.  You are dizzy or feel like you are going to pass out.  You have sudden chest pain.  You have a fever or lasting symptoms for more than 2-3 days.  You have a fever and your symptoms suddenly get worse. MAKE SURE YOU:  Understand these instructions.  Will watch your condition.  Will get help right away if you are not doing well or get worse. Document Released: 06/05/2010 Document Revised: 01/03/2013 Document Reviewed: 10/23/2012 [facility] Patient Information 2015 [place], [place]. This information is not intended to replace advice given to you by your health care provider. Make sure you discuss any questions you have with your health care provider.

## 2014-11-18 NOTE — ED Notes (Signed)
Patient here with complaints of right side flank pain that started three days ago. Hx of cancer of kidney taking chemo drug. Pain 10/10.

## 2014-11-18 NOTE — ED Notes (Signed)
Fentanyl pocket failed when removing vial. Pyxis did not register that medication was removed. Unable to waste med in pyxis, waste witness by Mining engineer. Count rectified with [place] tech.

## 2014-11-18 NOTE — ED Notes (Signed)
MD at bedside. 

## 2014-11-18 NOTE — ED Notes (Signed)
RN Ander Purpura is starting IV right now and getting labs

## 2014-12-16 ENCOUNTER — Ambulatory Visit: Payer: BLUE CROSS/BLUE SHIELD | Admitting: Internal Medicine

## 2014-12-25 ENCOUNTER — Encounter: Payer: Self-pay | Admitting: Internal Medicine

## 2014-12-31 ENCOUNTER — Encounter: Payer: Self-pay | Admitting: Internal Medicine

## 2014-12-31 ENCOUNTER — Ambulatory Visit (INDEPENDENT_AMBULATORY_CARE_PROVIDER_SITE_OTHER): Payer: BLUE CROSS/BLUE SHIELD | Admitting: Internal Medicine

## 2014-12-31 VITALS — BP 104/70 | Temp 98.2°F | Wt 131.0 lb

## 2014-12-31 DIAGNOSIS — C7800 Secondary malignant neoplasm of unspecified lung: Secondary | ICD-10-CM | POA: Diagnosis not present

## 2014-12-31 DIAGNOSIS — E039 Hypothyroidism, unspecified: Secondary | ICD-10-CM | POA: Diagnosis not present

## 2014-12-31 DIAGNOSIS — IMO0002 Reserved for concepts with insufficient information to code with codable children: Secondary | ICD-10-CM | POA: Insufficient documentation

## 2014-12-31 DIAGNOSIS — T451X5S Adverse effect of antineoplastic and immunosuppressive drugs, sequela: Secondary | ICD-10-CM

## 2014-12-31 DIAGNOSIS — C649 Malignant neoplasm of unspecified kidney, except renal pelvis: Secondary | ICD-10-CM | POA: Diagnosis not present

## 2014-12-31 DIAGNOSIS — IMO0001 Reserved for inherently not codable concepts without codable children: Secondary | ICD-10-CM

## 2014-12-31 MED ORDER — LEVOTHYROXINE SODIUM 88 MCG PO TABS: 88.0000 ug | ORAL_TABLET | Freq: Every day | ORAL | Status: DC

## 2014-12-31 NOTE — Progress Notes (Signed)
Pre visit review using our clinic review tool, if applicable. No additional management support is needed unless otherwise documented below in the visit note.  Chief Complaint  Patient presents with  . Follow-up    HPI: Kendra Mcfarland 64 y.o.  comes in for chronic disease/ medication management  Has dx of metastatic RCCA and thyroid management  Pt seen with and without husband  She is back on chemotherapy and has had Dec appetite  From chemo  Taste etc dec protein.  And now nausea  A bid problem.  Taking phenergan  To help. Having a hard time finding food she can tolerate can do fruits but not much protein. She had a TSH done at [facility] and it was in the 16 range. She is on 50 g of levothyroxin and isn't taking it regularly without other medicines and some water. She does feel sluggish and tired sometimes memory issues is coping with her current diagnosis of metastatic renal cell under treatment due for a scan at the early September. She only rarely takes lorazepam and doesn't take Klonopin. ROS: See pertinent positives and negatives per HPI. She tends to have loose stools no palpitations or racing heart. Has righ lateral chest discomfort continuing no severe.   Past Medical History  Diagnosis Date  . Anemia     years ago  . Transfusion history     before her hysterectomy  . Anxiety   . Hyperlipidemia   . Hypertension   . Spinal stenosis   . Spondylosis     degenerative adult  . Bulging lumbar disc   . MONONUCLEOSIS 08/14/2007    Qualifier: Diagnosis of  Problem Stop Reason: Removed By: Hulan Saas, CMA (AAMA), [place] Skye   . Vertigo   . Iron disorder     excessive iron storage   . Cancer     Family History  Problem Relation Age of Onset  . Hypertension Mother   . Arthritis Mother   . Hyperlipidemia Mother   . Memory loss Mother 62    short term  . Other Mother 23    angioplasty  . Arthritis Father   . Hypertension Father   . Heart attack Father   . Hyperthyroidism Sister     . Anemia Sister   . Hypothyroidism Sister   . Hyperlipidemia Sister   . Colon cancer Neg Hx   . Esophageal cancer Neg Hx   . Rectal cancer Neg Hx   . Stomach cancer Neg Hx     Social History   Social History  . Marital Status: Married    Spouse Name: N/A  . Number of Children: N/A  . Years of Education: N/A   Social History Main Topics  . Smoking status: Former Smoker    Quit date: 05/17/1994  . Smokeless tobacco: Never Used  . Alcohol Use: Yes     Comment: rare  . Drug Use: No  . Sexual Activity: Yes    Birth Control/ Protection: None   Other Topics Concern  . None   Social History Narrative   No ets   Regular exercise- thinking about yoga  As per dr Ellene Route.    HH of 2    Pet dog puppy 6 months    Good sleep   G2P2    Outpatient Prescriptions Prior to Visit  Medication Sig Dispense Refill  . acetaminophen (TYLENOL) 500 MG tablet Take 500 mg by mouth every 6 (six) hours as needed (For pain.).    Marland Kitchen amLODipine (  NORVASC) 10 MG tablet Take 1 tablet (10 mg total) by mouth daily. (Patient taking differently: Take 10 mg by mouth daily with supper. ) 30 tablet 5  . carvedilol (COREG) 3.125 MG tablet Take 1 tablet (3.125 mg total) by mouth 2 (two) times daily with a meal. 60 tablet 6  . diphenhydramine-acetaminophen (TYLENOL PM) 25-500 MG TABS Take 1 tablet by mouth at bedtime as needed (For sleep.).    Marland Kitchen LORazepam (ATIVAN) 0.5 MG tablet Take 0.5 mg by mouth 3 (three) times daily as needed for anxiety.   1  . ondansetron (ZOFRAN) 4 MG tablet Take 4 mg by mouth every 8 (eight) hours as needed for nausea or vomiting.   0  . promethazine (PHENERGAN) 25 MG tablet Take 1 tablet (25 mg total) by mouth every 6 (six) hours as needed for nausea. 30 tablet 0  . rosuvastatin (CRESTOR) 10 MG tablet Take 10 mg by mouth daily with supper.    Marland Kitchen VOTRIENT 200 MG tablet Take 400 mg by mouth at bedtime.   9  . levothyroxine (SYNTHROID, LEVOTHROID) 50 MCG tablet Take 50 mcg by mouth daily.   3   . cefUROXime (CEFTIN) 500 MG tablet Take 1 tablet (500 mg total) by mouth 2 (two) times daily with a meal. 14 tablet 0  . clonazePAM (KLONOPIN) 0.5 MG tablet Take 1 tablet (0.5 mg total) by mouth 2 (two) times daily as needed for anxiety. (Patient not taking: Reported on 12/31/2014) 60 tablet 0  . doxycycline (DORYX) 100 MG DR capsule Take 1 capsule (100 mg total) by mouth 2 (two) times daily. 14 capsule 0   No facility-administered medications prior to visit.     EXAM:  BP 104/70 mmHg  Temp(Src) 98.2 F (36.8 C) (Oral)  Wt 131 lb (59.421 kg)  Body mass index is 26.44 kg/(m^2).  GENERAL: vitals reviewed and listed above, alert, oriented, appears well hydrated and in no acute distress HEENT: atraumatic, conjunctiva  clear, no obvious abnormalities on inspection of external nose and ears OP : no lesion edema or exudate  NECK: no obvious masses on inspection palpation  LUNGS: clear to auscultation bilaterally, no wheezes, rales or rhonchi, good air movement CV: HRRR, no clubbing cyanosis or  peripheral edema nl cap refill  MS: moves all extremities without noticeable focal  abnormality PSYCH: pleasant and cooperative, no obvious depression or anxiety Lab Results  Component Value Date   WBC 10.6* 11/18/2014   HGB 13.8 11/18/2014   HCT 42.4 11/18/2014   PLT 780* 11/18/2014   GLUCOSE 104* 11/18/2014   CHOL 142 06/12/2014   TRIG 235* 06/12/2014   HDL 32* 06/12/2014   LDLDIRECT 214.5 03/28/2008   LDLCALC 63 06/12/2014   ALT 19 11/18/2014   AST 25 11/18/2014   NA 137 11/18/2014   K 4.7 11/18/2014   CL 101 11/18/2014   CREATININE 1.35* 11/18/2014   BUN 19 11/18/2014   CO2 26 11/18/2014   TSH 2.46 10/16/2014   INR 1.06 07/07/2014   Wt Readings from Last 3 Encounters:  12/31/14 131 lb (59.421 kg)  10/24/14 137 lb 12.8 oz (62.506 kg)  07/30/14 135 lb 8 oz (61.462 kg)     ASSESSMENT AND PLAN:  Discussed the following assessment and plan:  Hypothyroidism, unspecified  hypothyroidism type - tsh here 2 range now 17 at [facility] pt sensitive to meds inc to 88 and check tsh free t4 in 6 weeks then plan fu   Effect of chemotherapy, sequela - tast appetite  nausea  see text   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 Coping skills  Did see psychology in past  Released her as she is felt to be coping normally.  -Patient advised to return or notify health care team  if symptoms worsen ,persist or new concerns arise.  Patient Instructions  Increase to 88 mcg  Thyroid medication . Recheck tsh in  6 weeks .   And then decide on next step.    Make lab appt .      Standley Brooking. Naseer Hearn M.D.

## 2014-12-31 NOTE — Patient Instructions (Signed)
Increase to 88 mcg  Thyroid medication . Recheck tsh in  6 weeks .   And then decide on next step.    Make lab appt .

## 2015-01-07 ENCOUNTER — Other Ambulatory Visit: Payer: Self-pay | Admitting: Internal Medicine

## 2015-01-07 NOTE — Telephone Encounter (Signed)
Sent to the pharmacy by e-scribe. 

## 2015-01-08 ENCOUNTER — Other Ambulatory Visit: Payer: BLUE CROSS/BLUE SHIELD

## 2015-01-15 ENCOUNTER — Ambulatory Visit: Payer: BLUE CROSS/BLUE SHIELD | Admitting: Internal Medicine

## 2015-02-11 ENCOUNTER — Other Ambulatory Visit (INDEPENDENT_AMBULATORY_CARE_PROVIDER_SITE_OTHER): Payer: BLUE CROSS/BLUE SHIELD

## 2015-02-11 DIAGNOSIS — E039 Hypothyroidism, unspecified: Secondary | ICD-10-CM | POA: Diagnosis not present

## 2015-02-11 LAB — TSH: TSH: 1.89 u[IU]/mL (ref 0.35–4.50)

## 2015-02-18 ENCOUNTER — Other Ambulatory Visit: Payer: Self-pay | Admitting: Family Medicine

## 2015-02-18 MED ORDER — CARVEDILOL 3.125 MG PO TABS: 3.1250 mg | ORAL_TABLET | Freq: Two times a day (BID) | ORAL | Status: DC

## 2015-02-18 NOTE — Telephone Encounter (Signed)
Sent to the pharmacy by e-scribe. 

## 2015-03-03 ENCOUNTER — Emergency Department (HOSPITAL_COMMUNITY): Payer: Medicare Other

## 2015-03-03 ENCOUNTER — Inpatient Hospital Stay (HOSPITAL_COMMUNITY)
Admission: EM | Admit: 2015-03-03 | Discharge: 2015-03-06 | DRG: 180 | Disposition: A | Discharge status: Home Health | Payer: Medicare Other | Attending: Internal Medicine | Admitting: Internal Medicine

## 2015-03-03 ENCOUNTER — Encounter (HOSPITAL_COMMUNITY): Payer: Self-pay | Admitting: Family Medicine

## 2015-03-03 DIAGNOSIS — Z880 Allergy status to penicillin: Secondary | ICD-10-CM | POA: Diagnosis not present

## 2015-03-03 DIAGNOSIS — C649 Malignant neoplasm of unspecified kidney, except renal pelvis: Secondary | ICD-10-CM

## 2015-03-03 DIAGNOSIS — J189 Pneumonia, unspecified organism: Secondary | ICD-10-CM | POA: Diagnosis present

## 2015-03-03 DIAGNOSIS — I959 Hypotension, unspecified: Secondary | ICD-10-CM | POA: Diagnosis present

## 2015-03-03 DIAGNOSIS — Z90710 Acquired absence of both cervix and uterus: Secondary | ICD-10-CM

## 2015-03-03 DIAGNOSIS — I10 Essential (primary) hypertension: Secondary | ICD-10-CM | POA: Diagnosis present

## 2015-03-03 DIAGNOSIS — C7801 Secondary malignant neoplasm of right lung: Principal | ICD-10-CM | POA: Diagnosis present

## 2015-03-03 DIAGNOSIS — Z9081 Acquired absence of spleen: Secondary | ICD-10-CM | POA: Diagnosis not present

## 2015-03-03 DIAGNOSIS — Z905 Acquired absence of kidney: Secondary | ICD-10-CM | POA: Diagnosis not present

## 2015-03-03 DIAGNOSIS — Z888 Allergy status to other drugs, medicaments and biological substances status: Secondary | ICD-10-CM

## 2015-03-03 DIAGNOSIS — Z886 Allergy status to analgesic agent status: Secondary | ICD-10-CM

## 2015-03-03 DIAGNOSIS — Z885 Allergy status to narcotic agent status: Secondary | ICD-10-CM

## 2015-03-03 DIAGNOSIS — N179 Acute kidney failure, unspecified: Secondary | ICD-10-CM | POA: Diagnosis not present

## 2015-03-03 DIAGNOSIS — E43 Unspecified severe protein-calorie malnutrition: Secondary | ICD-10-CM | POA: Diagnosis present

## 2015-03-03 DIAGNOSIS — R0600 Dyspnea, unspecified: Secondary | ICD-10-CM

## 2015-03-03 DIAGNOSIS — J9811 Atelectasis: Secondary | ICD-10-CM | POA: Diagnosis present

## 2015-03-03 DIAGNOSIS — E876 Hypokalemia: Secondary | ICD-10-CM | POA: Diagnosis present

## 2015-03-03 DIAGNOSIS — J90 Pleural effusion, not elsewhere classified: Secondary | ICD-10-CM | POA: Diagnosis present

## 2015-03-03 DIAGNOSIS — Z85528 Personal history of other malignant neoplasm of kidney: Secondary | ICD-10-CM | POA: Diagnosis not present

## 2015-03-03 DIAGNOSIS — E039 Hypothyroidism, unspecified: Secondary | ICD-10-CM | POA: Diagnosis present

## 2015-03-03 DIAGNOSIS — E785 Hyperlipidemia, unspecified: Secondary | ICD-10-CM | POA: Diagnosis present

## 2015-03-03 DIAGNOSIS — Z8249 Family history of ischemic heart disease and other diseases of the circulatory system: Secondary | ICD-10-CM

## 2015-03-03 DIAGNOSIS — Z79899 Other long term (current) drug therapy: Secondary | ICD-10-CM

## 2015-03-03 DIAGNOSIS — C7802 Secondary malignant neoplasm of left lung: Secondary | ICD-10-CM | POA: Diagnosis present

## 2015-03-03 DIAGNOSIS — E860 Dehydration: Secondary | ICD-10-CM | POA: Diagnosis present

## 2015-03-03 DIAGNOSIS — Z9689 Presence of other specified functional implants: Secondary | ICD-10-CM

## 2015-03-03 DIAGNOSIS — Z87891 Personal history of nicotine dependence: Secondary | ICD-10-CM | POA: Diagnosis not present

## 2015-03-03 DIAGNOSIS — J910 Malignant pleural effusion: Secondary | ICD-10-CM | POA: Diagnosis present

## 2015-03-03 DIAGNOSIS — Z882 Allergy status to sulfonamides status: Secondary | ICD-10-CM

## 2015-03-03 LAB — COMPREHENSIVE METABOLIC PANEL
ALT: 29 U/L (ref 14–54)
AST: 52 U/L — ABNORMAL HIGH (ref 15–41)
Albumin: 1.5 g/dL — ABNORMAL LOW (ref 3.5–5.0)
Alkaline Phosphatase: 190 U/L — ABNORMAL HIGH (ref 38–126)
Anion gap: 11 (ref 5–15)
BUN: 17 mg/dL (ref 6–20)
CO2: 23 mmol/L (ref 22–32)
Calcium: 7.4 mg/dL — ABNORMAL LOW (ref 8.9–10.3)
Chloride: 97 mmol/L — ABNORMAL LOW (ref 101–111)
Creatinine, Ser: 1.86 mg/dL — ABNORMAL HIGH (ref 0.44–1.00)
GFR calc Af Amer: 32 mL/min — ABNORMAL LOW (ref >60)
GFR calc non Af Amer: 27 mL/min — ABNORMAL LOW (ref >60)
Glucose, Bld: 106 mg/dL — ABNORMAL HIGH (ref 65–99)
Potassium: 3.3 mmol/L — ABNORMAL LOW (ref 3.5–5.1)
Sodium: 131 mmol/L — ABNORMAL LOW (ref 135–145)
Total Bilirubin: 0.5 mg/dL (ref 0.3–1.2)
Total Protein: 5.6 g/dL — ABNORMAL LOW (ref 6.5–8.1)

## 2015-03-03 LAB — BASIC METABOLIC PANEL
ANION GAP: 10 (ref 5–15)
BUN: 14 mg/dL (ref 6–20)
CALCIUM: 7.9 mg/dL — AB (ref 8.9–10.3)
CHLORIDE: 102 mmol/L (ref 101–111)
CO2: 28 mmol/L (ref 22–32)
CREATININE: 1.51 mg/dL — AB (ref 0.44–1.00)
GFR calc non Af Amer: 35 mL/min — ABNORMAL LOW (ref >60)
GFR, EST AFRICAN AMERICAN: 41 mL/min — AB (ref >60)
Glucose, Bld: 129 mg/dL — ABNORMAL HIGH (ref 65–99)
POTASSIUM: 3.5 mmol/L (ref 3.5–5.1)
SODIUM: 140 mmol/L (ref 135–145)

## 2015-03-03 LAB — CBC
HCT: 38.4 % (ref 36.0–46.0)
HCT: 35.8 % — ABNORMAL LOW (ref 36.0–46.0)
Hemoglobin: 13.3 g/dL (ref 12.0–15.0)
Hemoglobin: 12.6 g/dL (ref 12.0–15.0)
MCH: 31.8 pg (ref 26.0–34.0)
MCH: 32.1 pg (ref 26.0–34.0)
MCHC: 34.6 g/dL (ref 30.0–36.0)
MCHC: 35.2 g/dL (ref 30.0–36.0)
MCV: 91.9 fL (ref 78.0–100.0)
MCV: 91.3 fL (ref 78.0–100.0)
Platelets: 490 10*3/uL — ABNORMAL HIGH (ref 150–400)
Platelets: 448 10*3/uL — ABNORMAL HIGH (ref 150–400)
RBC: 4.18 MIL/uL (ref 3.87–5.11)
RBC: 3.92 MIL/uL (ref 3.87–5.11)
RDW: 16.2 % — ABNORMAL HIGH (ref 11.5–15.5)
RDW: 16.2 % — ABNORMAL HIGH (ref 11.5–15.5)
WBC: 11 10*3/uL — AB (ref 4.0–10.5)
WBC: 11.4 10*3/uL — ABNORMAL HIGH (ref 4.0–10.5)

## 2015-03-03 LAB — I-STAT TROPONIN, ED: TROPONIN I, POC: 0 ng/mL (ref 0.00–0.08)

## 2015-03-03 LAB — APTT: aPTT: 38 seconds — ABNORMAL HIGH (ref 24–37)

## 2015-03-03 LAB — TSH: TSH: 0.384 u[IU]/mL (ref 0.350–4.500)

## 2015-03-03 LAB — PROTIME-INR
INR: 1.22 (ref 0.00–1.49)
Prothrombin Time: 15.6 seconds — ABNORMAL HIGH (ref 11.6–15.2)

## 2015-03-03 LAB — HEPATIC FUNCTION PANEL
ALT: 30 U/L (ref 14–54)
AST: 46 U/L — ABNORMAL HIGH (ref 15–41)
Albumin: 1.5 g/dL — ABNORMAL LOW (ref 3.5–5.0)
Alkaline Phosphatase: 193 U/L — ABNORMAL HIGH (ref 38–126)
Bilirubin, Direct: 0.1 mg/dL (ref 0.1–0.5)
Indirect Bilirubin: 0.4 mg/dL (ref 0.3–0.9)
Total Bilirubin: 0.5 mg/dL (ref 0.3–1.2)
Total Protein: 5.3 g/dL — ABNORMAL LOW (ref 6.5–8.1)

## 2015-03-03 LAB — MRSA PCR SCREENING: MRSA by PCR: NEGATIVE

## 2015-03-03 LAB — PROCALCITONIN: PROCALCITONIN: 0.55 ng/mL

## 2015-03-03 LAB — LACTIC ACID, PLASMA: Lactic Acid, Venous: 1.9 mmol/L (ref 0.5–2.0)

## 2015-03-03 MED ORDER — LORAZEPAM 0.5 MG PO TABS
0.5000 mg | ORAL_TABLET | Freq: Four times a day (QID) | ORAL | Status: DC | PRN
  Administered 2015-03-03: 0.5 mg via ORAL
  Filled 2015-03-03: qty 1

## 2015-03-03 MED ORDER — ACETAMINOPHEN 325 MG PO TABS
650.0000 mg | ORAL_TABLET | Freq: Four times a day (QID) | ORAL | Status: DC | PRN
  Administered 2015-03-04 – 2015-03-06 (×6): 650 mg via ORAL
  Filled 2015-03-03 (×6): qty 2

## 2015-03-03 MED ORDER — ONDANSETRON HCL 4 MG PO TABS
4.0000 mg | ORAL_TABLET | Freq: Four times a day (QID) | ORAL | Status: DC | PRN
  Administered 2015-03-05: 4 mg via ORAL
  Filled 2015-03-03: qty 1

## 2015-03-03 MED ORDER — ONDANSETRON HCL 4 MG/2ML IJ SOLN: 4.0000 mg | Freq: Four times a day (QID) | INTRAMUSCULAR | Status: DC | PRN

## 2015-03-03 MED ORDER — FENTANYL CITRATE (PF) 100 MCG/2ML IJ SOLN: 12.5000 ug | INTRAMUSCULAR | Status: DC | PRN

## 2015-03-03 MED ORDER — LORAZEPAM 0.5 MG PO TABS
0.5000 mg | ORAL_TABLET | Freq: Once | ORAL | Status: AC
  Administered 2015-03-03: 0.5 mg via ORAL
  Filled 2015-03-03: qty 1

## 2015-03-03 MED ORDER — SODIUM CHLORIDE 0.9 % IJ SOLN
3.0000 mL | Freq: Two times a day (BID) | INTRAMUSCULAR | Status: DC
Start: 2015-03-03 — End: 2015-03-06
  Administered 2015-03-03 – 2015-03-05 (×3): 3 mL via INTRAVENOUS

## 2015-03-03 MED ORDER — LEVOTHYROXINE SODIUM 88 MCG PO TABS
88.0000 ug | ORAL_TABLET | Freq: Every day | ORAL | Status: DC
  Administered 2015-03-05 – 2015-03-06 (×2): 88 ug via ORAL
  Filled 2015-03-03 (×4): qty 1

## 2015-03-03 MED ORDER — SODIUM CHLORIDE 0.9 % IV SOLN
INTRAVENOUS | Status: DC
  Administered 2015-03-03 – 2015-03-05 (×2): via INTRAVENOUS

## 2015-03-03 MED ORDER — DIAZEPAM 5 MG/ML IJ SOLN
5.0000 mg | Freq: Once | INTRAMUSCULAR | Status: AC
  Administered 2015-03-03: 5 mg via INTRAVENOUS
  Filled 2015-03-03: qty 2

## 2015-03-03 MED ORDER — ALUM & MAG HYDROXIDE-SIMETH 200-200-20 MG/5ML PO SUSP: 30.0000 mL | Freq: Four times a day (QID) | ORAL | Status: DC | PRN

## 2015-03-03 MED ORDER — DEXTROSE 5 % IV SOLN
1.5000 g | INTRAVENOUS | Status: AC
  Administered 2015-03-04: 1.5 g via INTRAVENOUS
  Filled 2015-03-03: qty 1.5

## 2015-03-03 MED ORDER — LORAZEPAM 0.5 MG PO TABS
0.5000 mg | ORAL_TABLET | ORAL | Status: DC | PRN
  Administered 2015-03-04 – 2015-03-06 (×5): 0.5 mg via ORAL
  Filled 2015-03-03 (×5): qty 1

## 2015-03-03 MED ORDER — HEPARIN SODIUM (PORCINE) 5000 UNIT/ML IJ SOLN
5000.0000 [IU] | Freq: Three times a day (TID) | INTRAMUSCULAR | Status: DC
  Administered 2015-03-03 – 2015-03-06 (×8): 5000 [IU] via SUBCUTANEOUS
  Filled 2015-03-03 (×9): qty 1

## 2015-03-03 MED ORDER — ALBUMIN HUMAN 25 % IV SOLN
25.0000 g | Freq: Once | INTRAVENOUS | Status: AC
  Administered 2015-03-03: 25 g via INTRAVENOUS
  Filled 2015-03-03: qty 100

## 2015-03-03 NOTE — Progress Notes (Signed)
Pt c/o of increased anxiety after prn given paged K Baltazar Najjar new orders received emotional support given to pt and family.

## 2015-03-03 NOTE — ED Notes (Signed)
Pt here for SOB, pt is currently being treated for PNA and not better. Denies fever. Positive cough. Pain in right rib area.

## 2015-03-03 NOTE — Consult Note (Signed)
[address]       [place] [address]             [phone]        [facility] [place] Medical Record #[id number] Date of Birth: [birth year]  Referring: No ref. provider found Primary Care: Kendra Dawson, MD  Chief Complaint:    Chief Complaint  Patient presents with  . Shortness of Breath    History of Present Illness:    The patient is a 64 year old female who presents to the ER with SOB. CXR reveals the following: IMPRESSION: 1. New large right pleural effusion. 2. Patchy consolidation in the mid to lower right lung, likely a combination of atelectasis, pneumonia and/or tumor. 3. 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 She has been treated for pneumonia since last week with Levaquin and symptoms have worsened. She denies fevers, chills or productive cough. She describes the shortness of breath has progressive and in particular, worse over the past 3 days. She is being [facility] at [facility]. She was recently on Votriant, which was stopped abd changed to Sutent which also is stopped for side effects.  We are consulted on for potential chest tube placement vs other management of large pleural effusion. \ Current Activity/ Functional Status: Patient is independent with mobility/ambulation, transfers, ADL's, IADL's.   Zubrod Score: At the time of surgery this patient's most appropriate activity status/level should be described as: []     0    Normal activity, no symptoms [x]     1    Restricted in physical strenuous activity but ambulatory, able to do out light work []     2    Ambulatory and capable of self care, unable to do work activities, up and about                 more than 50%  Of the time                            []     3    Only limited self care, in bed greater than 50% of waking hours []     4    Completely disabled, no self care, confined to  bed or chair []     5    Moribund  Past Medical History  Diagnosis Date  . Anemia     years ago  . Transfusion history     before her hysterectomy  . Anxiety   . Hyperlipidemia   . Hypertension   . Spinal stenosis   . Spondylosis     degenerative adult  . Bulging lumbar disc   . MONONUCLEOSIS 08/14/2007    Qualifier: Diagnosis of  Problem Stop Reason: Removed By: Hulan Saas, CMA (AAMA), Quita Skye   . Vertigo   . Iron disorder     excessive iron storage   . Cancer [facility])   hypothyroidism   Past Surgical History  Procedure Laterality Date  . Inguinal hernia repair      at birth  . Cesarean section      x 2  . Appendectomy    . Abdominal hysterectomy      bleeding fibroid tumors has ovaries  . Vein protrusion in head as a child    . Blood transfusion from vaginal bleeding    nephrectomy (  left) spleenectomy    History  Smoking status  . Former Smoker  . Quit date: 05/17/1994  Smokeless tobacco  . Never Used   History  Alcohol Use  . Yes    Comment: rare    Social History   Social History  . Marital Status: Married    Spouse Name: N/A  . Number of Children: N/A  . Years of Education: N/A   Occupational History  . Not on file.   Social History Main Topics  . Smoking status: Former Smoker    Quit date: 05/17/1994  . Smokeless tobacco: Never Used  . Alcohol Use: Yes     Comment: rare  . Drug Use: No  . Sexual Activity: Yes    Birth Control/ Protection: None   Other Topics Concern  . Not on file   Social History Narrative   No ets   Regular exercise- thinking about yoga  As per dr [facility].    HH of 2    Pet dog puppy 6 months    Good sleep   G2P2    Allergies  Allergen Reactions  . Buprenorphine Hcl Nausea And Vomiting  . Hydrocodone Nausea And Vomiting  . Morphine And Related Nausea And Vomiting  . Oxycodone Nausea And Vomiting  . Tramadol Nausea And Vomiting  . Naproxen Sodium Hives, Itching, Rash and Other (See Comments)    Can take  advil   . Penicillins     Has patient had a PCN reaction causing immediate rash, facial/tongue/throat swelling, SOB or lightheadedness with hypotension: NO Has patient had a PCN reaction causing severe rash involving mucus membranes or skin necrosis:NO Has patient had a PCN reaction that required hospitalizationNO Has patient had a PCN reaction occurring within the last 10 years:NO If all of the above answers are "NO", then may proceed with Cephalosporin use.  . Sulfonamide Derivatives Hives, Itching, Rash and Other (See Comments)    dypsnea also    No current facility-administered medications for this encounter.   Current Outpatient Prescriptions  Medication Sig Dispense Refill  . acetaminophen (TYLENOL) 500 MG tablet Take 500 mg by mouth every 6 (six) hours as needed (For pain.).    Marland Kitchen amLODipine (NORVASC) 10 MG tablet TAKE 1 TABLET BY MOUTH DAILY 30 tablet 5  . carvedilol (COREG) 3.125 MG tablet Take 1 tablet (3.125 mg total) by mouth 2 (two) times daily with a meal. 60 tablet 2  . levofloxacin (LEVAQUIN) 500 MG tablet Take 500 mg by mouth daily. Take for 10 days. Husband states she has taken 7 days worth of medication already as of 03-03-15    . levothyroxine (SYNTHROID, LEVOTHROID) 88 MCG tablet Take 88 mcg by mouth daily before breakfast.    . LORazepam (ATIVAN) 0.5 MG tablet Take 0.5 mg by mouth every 6 (six) hours as needed. For nausea per husband  1  . ondansetron (ZOFRAN) 4 MG tablet Take 4 mg by mouth every 8 (eight) hours as needed for nausea or vomiting.   0  . promethazine (PHENERGAN) 25 MG tablet Take 1 tablet (25 mg total) by mouth every 6 (six) hours as needed for nausea. 30 tablet 0  . rosuvastatin (CRESTOR) 10 MG tablet Take 10 mg by mouth daily with supper.    . levothyroxine (SYNTHROID, LEVOTHROID) 88 MCG tablet Take 1 tablet (88 mcg total) by mouth daily. (Patient not taking: Reported on 03/03/2015) 90 tablet 3     (Not in a hospital admission)  Family History  Problem Relation Age of Onset  . Hypertension Mother   . Arthritis Mother   . Hyperlipidemia Mother   . Memory loss Mother 33    short term  . Other Mother 74    angioplasty  . Arthritis Father   . Hypertension Father   . Heart attack Father   . Hyperthyroidism Sister   . Anemia Sister   . Hypothyroidism Sister   . Hyperlipidemia Sister   . Colon cancer Neg Hx   . Esophageal cancer Neg Hx   . Rectal cancer Neg Hx   . Stomach cancer Neg Hx      Review of Systems:       Cardiac Review of Systems: Y or N  Chest Pain [ y   ]  Resting SOB [ y  ] Exertional SOB  Blue.Reese  ]  Orthopnea Blue.Reese  ]  Pedal Edema [   ]    Palpitations Blue.Reese  ] Syncope  [  ]   Presyncope [ y  ]  General Review of Systems: [Y] = yes [  ]=no Constitional: recent weight change [  ]; anorexia [  ]; fatigue [  ]; nausea [  ]; night sweats [  ]; fever [  ]; or chills [  ]                                                               Dental: poor dentition[  ]; Last Dentist visit:   Eye : blurred vision [  ]; diplopia [   ]; vision changes [  ];  Amaurosis fugax[  ]; Resp: cough Blue.Reese  ];  wheezing[  ];  hemoptysis[  ]; shortness of breath[ y ]; paroxysmal nocturnal dyspnea[  ]; dyspnea on exertion[y  ]; or orthopnea[ y ];  GI:  gallstones[  ], vomiting[  ];  dysphagia[  ]; melena[  ];  hematochezia [  ]; heartburn[  ];   Hx of  Colonoscopy[  ];+ nausea GU: kidney stones [  ]; hematuria[  ];   dysuria [  ];  nocturia[  ];  history of     obstruction [  ]; urinary frequency [  ]             Skin: rash, swelling[  ];, hair loss[  ];  peripheral edema[  ];  or itching[  ]; Musculosketetal: myalgias[  ];  joint swelling[  ];  joint erythema[  ];  joint pain[  ];  back pain[  ];  Heme/Lymph: bruising[y  ];  bleeding[  ];  anemia[  ]; has been anemic Neuro: TIA[  ];  headaches[  ];  stroke[  ];  vertigo[  ];  seizures[  ];   paresthesias[  ];  difficulty walking[  ];  Psych:depression[  ]; Sylvester Harder  ];  Endocrine: diabetes[  ];   thyroid dysfunction[ y ];  Immunizations: Flu [ y ]; Pneumococcal[ n ];  Other:  Physical Exam: BP 133/53 mmHg  Pulse 115  Temp(Src) 97.6 F (36.4 C)  Resp 38  Ht 4\' 11"  (1.499 m)  Wt 128 lb (58.06 kg)  BMI 25.84 kg/m2  SpO2 95%   General appearance: alert, cooperative, fatigued and mild distress Head: Normocephalic, without obvious abnormality, atraumatic Neck: no adenopathy,  no carotid bruit, no JVD, supple, symmetrical, trachea midline and thyroid not enlarged, symmetric, no tenderness/mass/nodules Lymph nodes: Cervical, supraclavicular, and axillary nodes normal. Resp: dim right chest Back: symmetric, no curvature. ROM normal. No CVA tenderness. Cardio: regular rate and rhythm GI: soft, non-tender; bowel sounds normal; no masses,  no organomegaly Extremities: no edema Neurologic: Grossly normal  Diagnostic Studies & Laboratory data:     Recent Radiology Findings:   Dg Chest 2 View  03/03/2015  CLINICAL DATA:  Shortness of breath. Metastatic renal cell carcinoma. EXAM: CHEST  2 VIEW COMPARISON:  11/18/2014 chest radiograph FINDINGS: There is a large right pleural effusion, which is new. No left pleural effusion. No pneumothorax. Stable cardiomediastinal silhouette with normal heart size. There are patchy nodular opacities throughout both lungs, in keeping with known pulmonary metastatic disease, with slightly increased number and size of pulmonary nodules, largest 1.6 cm in the left upper lung. There is patchy consolidation in the mid to lower right lung. IMPRESSION: 1. New large right pleural effusion. 2. Patchy consolidation in the mid to lower right lung, likely a combination of atelectasis, pneumonia and/or tumor. 3. Slight progression of pulmonary metastatic disease in both lungs. Electronically Signed   By: Ilona Sorrel M.D.   On: 03/03/2015 14:13     I have independently reviewed the above radiologic studies.  Recent Lab Findings: Lab Results  Component Value Date    WBC 11.0* 03/03/2015   HGB 13.3 03/03/2015   HCT 38.4 03/03/2015   PLT 490* 03/03/2015   GLUCOSE 129* 03/03/2015   CHOL 142 06/12/2014   TRIG 235* 06/12/2014   HDL 32* 06/12/2014   LDLDIRECT 214.5 03/28/2008   LDLCALC 63 06/12/2014   ALT 19 11/18/2014   AST 25 11/18/2014   NA 140 03/03/2015   K 3.5 03/03/2015   CL 102 03/03/2015   CREATININE 1.51* 03/03/2015   BUN 14 03/03/2015   CO2 28 03/03/2015   TSH 1.89 02/11/2015   INR 1.06 07/07/2014      Assessment / Plan:  The patient has a large right pleural effusion. Discussion will need to be undertaken for long-term management as this is likely related to her pleural metastatic disease. Consideration of a Pleurx catheter as this may be the mode to best serve her long-term. She has some significant shortness of breath and orthopnea so may require a thoracentesis for symptomatic relief prior to this. Some consideration may be required for palliative care as well.        GOLD,WAYNE E, PA-C 03/03/2015 4:26 PM    Patient has had increasing sob since seen last week at [facility] then showed right effusion and progression of pulmonary mets Discussd with patient and husband placement of right pleurix in am unless develops more distress and needs urgent ct. The goals risks and alternatives of the planned surgical procedure right pleurix  have been discussed with the patient in detail. The risks of the procedure including death, infection, stroke, myocardial infarction, bleeding, blood transfusion have all been discussed specifically.  I have quoted [facility] a 5% of perioperative mortality and a complication rate as high as 40 %. The patient's questions have been answered.[facility] is willing  to proceed with the planned procedure.  I have seen and examined [facility] and agree with the above assessment  and plan.  Grace Isaac MD Beeper [phone] Office [phone] 03/03/2015 6:05 PM

## 2015-03-03 NOTE — H&P (Signed)
History and Physical        Hospital Admission Note Date: 03/03/2015  Patient name: [facility] Medical record number: [id number] Date of birth: 1951/05/17 Age: 64 y.o. Gender: female  PCP: Lottie Dawson, MD  Referring physician: EDP   Chief Complaint:  Worsening shortness of breath in the last 10 days  HPI: Patient is a 64 year old female with hypertension, hyperlipidemia, metastatic renal cell carcinoma, metastasized to lungs, receiving chemotherapy at [facility] was recently started on Sutent presented to ED with worsening shortness of breath. History was obtained from the patient and her husband in the room. Patient reported that she noticed shortness of breath in the last 10 days, progressively worsening in the last 3 days. Patient went to her PCP and was treated with oral Levaquin for possible pneumonia. She denies any fevers or chills, productive cough, any vomiting or aspiration. Patient noticed progressively worsening shortness of breath, sitting upright, having some right-sided chest pain, worse on sitting up. ED workup showed BMET unremarkable except BUN 14, creatinine 1.5, CBC showed white count of 11.0, otherwise unremarkable. Troponin negative. Chest x-ray showed new large right pleural effusion with patchy consolidation in the mid to lower right lung likely combination of atelectasis pneumonia or tumor, slight progression of pulmonary metastatic disease to both lungs. Vital signs during the encounter SBP in 80s, heart rate 110-120's   Review of Systems:  Constitutional: Denies fever, chills, diaphoresis,+ poor appetite and fatigue.  HEENT: Denies photophobia, eye pain, redness, hearing loss, ear pain, congestion, sore throat, rhinorrhea, sneezing, mouth sores, trouble swallowing, neck pain, neck stiffness and tinnitus.   Respiratory:Please see history of present  illness  Cardiovascular: Denies palpitations and leg swelling.  Gastrointestinal: + nausea,denies any vomiting, abdominal pain, diarrhea, constipation, blood in stool and abdominal distention.  Genitourinary: Denies dysuria, urgency, frequency, hematuria, flank pain and difficulty urinating.  Musculoskeletal: Denies myalgias, back pain, joint swelling, arthralgias and gait problem.  Skin: Denies pallor, rash and wound.  Neurological: Denies dizziness, seizures, light-headedness, numbness and headaches. + generalized weakness  Hematological: Denies adenopathy. Easy bruising, personal or family bleeding history  Psychiatric/Behavioral: Denies suicidal ideation, mood changes, confusion, nervousness, sleep disturbance and agitation  Past Medical History: Past Medical History  Diagnosis Date  . Anemia     years ago  . Transfusion history     before her hysterectomy  . Anxiety   . Hyperlipidemia   . Hypertension   . Spinal stenosis   . Spondylosis     degenerative adult  . Bulging lumbar disc   . MONONUCLEOSIS 08/14/2007    Qualifier: Diagnosis of  Problem Stop Reason: Removed By: Hulan Saas, CMA (AAMA), Quita Skye   . Vertigo   . Iron disorder     excessive iron storage   . Cancer [facility])     Past Surgical History  Procedure Laterality Date  . Inguinal hernia repair      at birth  . Cesarean section      x 2  . Appendectomy    . Abdominal hysterectomy      bleeding fibroid tumors has ovaries  . Vein protrusion in head as a child    . Blood transfusion from  vaginal bleeding      Medications: Prior to Admission medications   Medication Sig Start Date End Date Taking? Authorizing Provider  acetaminophen (TYLENOL) 500 MG tablet Take 500 mg by mouth every 6 (six) hours as needed (For pain.).   Yes Historical Provider, MD  amLODipine (NORVASC) 10 MG tablet TAKE 1 TABLET BY MOUTH DAILY 01/07/15  Yes Burnis Medin, MD  carvedilol (COREG) 3.125 MG tablet Take 1 tablet (3.125 mg total)  by mouth 2 (two) times daily with a meal. 02/18/15  Yes Burnis Medin, MD  levofloxacin (LEVAQUIN) 500 MG tablet Take 500 mg by mouth daily. Take for 10 days. Husband states she has taken 7 days worth of medication already as of 03-03-15   Yes Historical Provider, MD  levothyroxine (SYNTHROID, LEVOTHROID) 88 MCG tablet Take 88 mcg by mouth daily before breakfast.   Yes Historical Provider, MD  LORazepam (ATIVAN) 0.5 MG tablet Take 0.5 mg by mouth every 6 (six) hours as needed. For nausea per husband 09/12/14  Yes Historical Provider, MD  ondansetron (ZOFRAN) 4 MG tablet Take 4 mg by mouth every 8 (eight) hours as needed for nausea or vomiting.  09/20/14  Yes Historical Provider, MD  promethazine (PHENERGAN) 25 MG tablet Take 1 tablet (25 mg total) by mouth every 6 (six) hours as needed for nausea. 11/18/14  Yes Varney Biles, MD  rosuvastatin (CRESTOR) 10 MG tablet Take 10 mg by mouth daily with supper.   Yes Historical Provider, MD  levothyroxine (SYNTHROID, LEVOTHROID) 88 MCG tablet Take 1 tablet (88 mcg total) by mouth daily. Patient not taking: Reported on 03/03/2015 12/31/14   Burnis Medin, MD    Allergies:   Allergies  Allergen Reactions  . Buprenorphine Hcl Nausea And Vomiting  . Hydrocodone Nausea And Vomiting  . Morphine And Related Nausea And Vomiting  . Oxycodone Nausea And Vomiting  . Tramadol Nausea And Vomiting  . Naproxen Sodium Hives, Itching, Rash and Other (See Comments)    Can take advil   . Penicillins     Has patient had a PCN reaction causing immediate rash, facial/tongue/throat swelling, SOB or lightheadedness with hypotension: NO Has patient had a PCN reaction causing severe rash involving mucus membranes or skin necrosis:NO Has patient had a PCN reaction that required hospitalizationNO Has patient had a PCN reaction occurring within the last 10 years:NO If all of the above answers are "NO", then may proceed with Cephalosporin use.  . Sulfonamide Derivatives Hives,  Itching, Rash and Other (See Comments)    dypsnea also    Social History:  reports that she quit smoking about 20 years ago. She has never used smokeless tobacco. She reports that she drinks alcohol. She reports that she does not use illicit drugs. patient currently lives at home and is functional with her ADLs  Family History: Family History  Problem Relation Age of Onset  . Hypertension Mother   . Arthritis Mother   . Hyperlipidemia Mother   . Memory loss Mother 15    short term  . Other Mother 56    angioplasty  . Arthritis Father   . Hypertension Father   . Heart attack Father   . Hyperthyroidism Sister   . Anemia Sister   . Hypothyroidism Sister   . Hyperlipidemia Sister   . Colon cancer Neg Hx   . Esophageal cancer Neg Hx   . Rectal cancer Neg Hx   . Stomach cancer Neg Hx     Physical Exam: Blood  pressure 133/53, pulse 115, temperature 97.6 F (36.4 C), resp. rate 38, height 4\' 11"  (1.499 m), weight 58.06 kg (128 lb), SpO2 95 %. General: Alert, awake, oriented x3, in  mild distress.Sitting upright in the bed, able to speak in full sentences  HEENT: normocephalic, atraumatic, anicteric sclera, pink conjunctiva, pupils equal and reactive to light and accomodation, oropharynx clear Neck: supple, no masses or lymphadenopathy, no goiter, no bruits  Heart: Regular rate and rhythm, without murmurs, rubs or gallops. Lungs:Decreased breath sounds at the bases and diminished in the right chest  Abdomen: Soft, nontender, nondistended, positive bowel sounds, no masses. Extremities: No clubbing, cyanosis or edema with positive pedal pulses. Neuro: Grossly intact, no focal neurological deficits, strength 5/5 upper and lower extremities bilaterally Psych: alert and oriented x 3, normal mood and affect Skin: no rashes or lesions, warm and dry   LABS on Admission:  Basic Metabolic Panel:  Recent Labs Lab 03/03/15 1405  NA 140  K 3.5  CL 102  CO2 28  GLUCOSE 129*  BUN 14    CREATININE 1.51*  CALCIUM 7.9*   Liver Function Tests: No results for input(s): AST, ALT, ALKPHOS, BILITOT, PROT, ALBUMIN in the last 168 hours. No results for input(s): LIPASE, AMYLASE in the last 168 hours. No results for input(s): AMMONIA in the last 168 hours. CBC:  Recent Labs Lab 03/03/15 1405  WBC 11.0*  HGB 13.3  HCT 38.4  MCV 91.9  PLT 490*   Cardiac Enzymes: No results for input(s): CKTOTAL, CKMB, CKMBINDEX, TROPONINI in the last 168 hours. BNP: Invalid input(s): POCBNP CBG: No results for input(s): GLUCAP in the last 168 hours.  Radiological Exams on Admission:  Dg Chest 2 View  03/03/2015  CLINICAL DATA:  Shortness of breath. Metastatic renal cell carcinoma. EXAM: CHEST  2 VIEW COMPARISON:  11/18/2014 chest radiograph FINDINGS: There is a large right pleural effusion, which is new. No left pleural effusion. No pneumothorax. Stable cardiomediastinal silhouette with normal heart size. There are patchy nodular opacities throughout both lungs, in keeping with known pulmonary metastatic disease, with slightly increased number and size of pulmonary nodules, largest 1.6 cm in the left upper lung. There is patchy consolidation in the mid to lower right lung. IMPRESSION: 1. New large right pleural effusion. 2. Patchy consolidation in the mid to lower right lung, likely a combination of atelectasis, pneumonia and/or tumor. 3. Slight progression of pulmonary metastatic disease in both lungs. Electronically Signed   By: Ilona Sorrel M.D.   On: 03/03/2015 14:13    *I have personally reviewed the images above*  EKG: Independently reviewed. 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 If no significant improvement, may need vasopressors, discussed with Dr. Janann Colonel ([facility] elink) - Patient was being treated with Levaquin for possible pneumonia outpatient however she is not spiking any fevers, no leukocytosis and productive cough to suggest infectious etiology. For now hold off on antibiotics, if patient has parapneumonic effusion or worsening respiratory status, will place on broad-spectrum IV antibiotics.  - Cardiothoracic surgery has been consulted, planning on thoracentesis/chest tube and possible Pleurx cath given possibility of malignant pleural effusion   Active Problems:   Metastatic renal  cell carcinoma ([place]), metastasis to both lungs - Chemotherapy currently on hold. Discussed in detail about the code status, patient requested for FULL code status.     Hypothyroidism -Obtain TSH, continue Synthroid     Hypotension - Patient has a history of hypertension, hold Coreg, amlodipine - Placed on gentle fluid hydration with albumin infusion for intravascular replacement, follow albumin level    Acute kidney injury ([place]) with underlying history of renal cell carcinoma - Patient presented with creatinine of 1.5, baseline 1.2-1.3, likely some dehydration  - Placed on gentle hydration with albumin infusion, follow albumin level  DVT prophylaxis:  heparin subcutaneous   CODE STATUS:  full CODE STATUS   Family Communication: Admission, patients condition and plan of care including tests being ordered have been discussed with the patient and Husband  who indicates understanding and agree with the plan and Code Status   Time Spent on Admission: 68mins   Ericca Labra M.D. [facility] 03/03/2015, 5:21 PM Pager: [phone]  If 7PM-7AM, please contact night-coverage [email or web address] Password [email or web address]

## 2015-03-04 ENCOUNTER — Inpatient Hospital Stay (HOSPITAL_COMMUNITY): Payer: Medicare Other | Admitting: Certified Registered Nurse Anesthetist

## 2015-03-04 ENCOUNTER — Encounter (HOSPITAL_COMMUNITY): Payer: Self-pay | Admitting: Certified Registered Nurse Anesthetist

## 2015-03-04 ENCOUNTER — Inpatient Hospital Stay (HOSPITAL_COMMUNITY): Payer: Medicare Other

## 2015-03-04 ENCOUNTER — Encounter (HOSPITAL_COMMUNITY)
Admission: EM | Disposition: A | Discharge status: Home Health | Payer: Self-pay | Source: Home / Self Care | Attending: Internal Medicine

## 2015-03-04 HISTORY — PX: CHEST TUBE INSERTION: SHX231

## 2015-03-04 LAB — BODY FLUID CELL COUNT WITH DIFFERENTIAL
Eos, Fluid: 2 %
Lymphs, Fluid: 9 %
Monocyte-Macrophage-Serous Fluid: 9 % — ABNORMAL LOW (ref 50–90)
Neutrophil Count, Fluid: 80 % — ABNORMAL HIGH (ref 0–25)
Total Nucleated Cell Count, Fluid: 493 cu mm (ref 0–1000)

## 2015-03-04 LAB — BASIC METABOLIC PANEL
ANION GAP: 10 (ref 5–15)
BUN: 16 mg/dL (ref 6–20)
CALCIUM: 7.6 mg/dL — AB (ref 8.9–10.3)
CO2: 26 mmol/L (ref 22–32)
CREATININE: 1.72 mg/dL — AB (ref 0.44–1.00)
Chloride: 101 mmol/L (ref 101–111)
GFR calc Af Amer: 35 mL/min — ABNORMAL LOW (ref >60)
GFR, EST NON AFRICAN AMERICAN: 30 mL/min — AB (ref >60)
GLUCOSE: 107 mg/dL — AB (ref 65–99)
Potassium: 3.4 mmol/L — ABNORMAL LOW (ref 3.5–5.1)
Sodium: 137 mmol/L (ref 135–145)

## 2015-03-04 LAB — CBC
HCT: 33.5 % — ABNORMAL LOW (ref 36.0–46.0)
HEMOGLOBIN: 11.3 g/dL — AB (ref 12.0–15.0)
MCH: 30.9 pg (ref 26.0–34.0)
MCHC: 33.7 g/dL (ref 30.0–36.0)
MCV: 91.5 fL (ref 78.0–100.0)
PLATELETS: 451 10*3/uL — AB (ref 150–400)
RBC: 3.66 MIL/uL — ABNORMAL LOW (ref 3.87–5.11)
RDW: 16.4 % — AB (ref 11.5–15.5)
WBC: 11.6 10*3/uL — ABNORMAL HIGH (ref 4.0–10.5)

## 2015-03-04 LAB — LACTATE DEHYDROGENASE, PLEURAL OR PERITONEAL FLUID: LD FL: 1092 U/L — AB (ref 3–23)

## 2015-03-04 LAB — PROTEIN, BODY FLUID: TOTAL PROTEIN, FLUID: 3.4 g/dL

## 2015-03-04 LAB — GLUCOSE, SEROUS FLUID: Glucose, Fluid: 90 mg/dL

## 2015-03-04 LAB — LACTATE DEHYDROGENASE: LDH: 253 U/L — ABNORMAL HIGH (ref 98–192)

## 2015-03-04 LAB — PROTEIN, TOTAL: Total Protein: 5.1 g/dL — ABNORMAL LOW (ref 6.5–8.1)

## 2015-03-04 SURGERY — INSERTION, PLEURAL DRAINAGE CATHETER
Anesthesia: Monitor Anesthesia Care | Laterality: Right

## 2015-03-04 MED ORDER — ONDANSETRON HCL 4 MG/2ML IJ SOLN
INTRAMUSCULAR | Status: AC
  Filled 2015-03-04: qty 2

## 2015-03-04 MED ORDER — 0.9 % SODIUM CHLORIDE (POUR BTL) OPTIME
TOPICAL | Status: DC | PRN
  Administered 2015-03-04: 1000 mL

## 2015-03-04 MED ORDER — FENTANYL CITRATE (PF) 100 MCG/2ML IJ SOLN
25.0000 ug | INTRAMUSCULAR | Status: DC | PRN
  Administered 2015-03-04 (×2): 50 ug via INTRAVENOUS

## 2015-03-04 MED ORDER — PHENYLEPHRINE HCL 10 MG/ML IJ SOLN
10.0000 mg | INTRAVENOUS | Status: DC | PRN
  Administered 2015-03-04: 20 ug/min via INTRAVENOUS

## 2015-03-04 MED ORDER — PROPOFOL 500 MG/50ML IV EMUL
INTRAVENOUS | Status: DC | PRN
  Administered 2015-03-04: 50 ug/kg/min via INTRAVENOUS

## 2015-03-04 MED ORDER — FENTANYL CITRATE (PF) 100 MCG/2ML IJ SOLN
INTRAMUSCULAR | Status: AC
  Administered 2015-03-04: 50 ug via INTRAVENOUS
  Filled 2015-03-04: qty 2

## 2015-03-04 MED ORDER — VANCOMYCIN HCL IN DEXTROSE 750-5 MG/150ML-% IV SOLN
750.0000 mg | INTRAVENOUS | Status: DC
  Administered 2015-03-04 – 2015-03-05 (×2): 750 mg via INTRAVENOUS
  Filled 2015-03-04 (×3): qty 150

## 2015-03-04 MED ORDER — PROPOFOL 10 MG/ML IV BOLUS
INTRAVENOUS | Status: DC | PRN
  Administered 2015-03-04: 20 mg via INTRAVENOUS

## 2015-03-04 MED ORDER — BOOST / RESOURCE BREEZE PO LIQD
1.0000 | Freq: Three times a day (TID) | ORAL | Status: DC
  Administered 2015-03-05 – 2015-03-06 (×3): 1 via ORAL

## 2015-03-04 MED ORDER — DEXTROSE 5 % IV SOLN
1.5000 g | INTRAVENOUS | Status: DC
  Filled 2015-03-04: qty 1.5

## 2015-03-04 MED ORDER — LACTATED RINGERS IV SOLN
INTRAVENOUS | Status: DC | PRN
  Administered 2015-03-04: 07:00:00 via INTRAVENOUS

## 2015-03-04 MED ORDER — PROPOFOL 10 MG/ML IV BOLUS
INTRAVENOUS | Status: AC
  Filled 2015-03-04: qty 20

## 2015-03-04 MED ORDER — FENTANYL CITRATE (PF) 100 MCG/2ML IJ SOLN
INTRAMUSCULAR | Status: DC | PRN
  Administered 2015-03-04: 50 ug via INTRAVENOUS

## 2015-03-04 MED ORDER — ONDANSETRON HCL 4 MG/2ML IJ SOLN
INTRAMUSCULAR | Status: DC | PRN
  Administered 2015-03-04: 4 mg via INTRAVENOUS

## 2015-03-04 MED ORDER — MIDAZOLAM HCL 2 MG/2ML IJ SOLN
INTRAMUSCULAR | Status: AC
  Filled 2015-03-04: qty 4

## 2015-03-04 MED ORDER — FENTANYL CITRATE (PF) 250 MCG/5ML IJ SOLN
INTRAMUSCULAR | Status: AC
  Filled 2015-03-04: qty 5

## 2015-03-04 MED ORDER — PIPERACILLIN-TAZOBACTAM 3.375 G IVPB
3.3750 g | Freq: Three times a day (TID) | INTRAVENOUS | Status: DC
  Administered 2015-03-04 – 2015-03-06 (×6): 3.375 g via INTRAVENOUS
  Filled 2015-03-04 (×10): qty 50

## 2015-03-04 MED ORDER — MIDAZOLAM HCL 5 MG/5ML IJ SOLN
INTRAMUSCULAR | Status: DC | PRN
  Administered 2015-03-04 (×2): 1 mg via INTRAVENOUS

## 2015-03-04 MED ORDER — LIDOCAINE HCL (PF) 1 % IJ SOLN
INTRAMUSCULAR | Status: DC | PRN
  Administered 2015-03-04: 30 mL

## 2015-03-04 SURGICAL SUPPLY — 30 items
ADH SKN CLS APL DERMABOND .7 (GAUZE/BANDAGES/DRESSINGS) ×1
BRUSH SCRUB EZ PLAIN DRY (MISCELLANEOUS) ×4 IMPLANT
CANISTER SUCTION 2500CC (MISCELLANEOUS) ×2 IMPLANT
COVER SURGICAL LIGHT HANDLE (MISCELLANEOUS) ×2 IMPLANT
COVER TRANSDUCER ULTRASND GEL (DRAPE) ×2 IMPLANT
DERMABOND ADVANCED (GAUZE/BANDAGES/DRESSINGS) ×1
DERMABOND ADVANCED .7 DNX12 (GAUZE/BANDAGES/DRESSINGS) ×1 IMPLANT
DRAPE C-ARM 42X72 X-RAY (DRAPES) ×2 IMPLANT
DRAPE LAPAROSCOPIC ABDOMINAL (DRAPES) ×2 IMPLANT
DRSG OPSITE POSTOP 4X6 (GAUZE/BANDAGES/DRESSINGS) ×1 IMPLANT
GLOVE BIO SURGEON STRL SZ 6.5 (GLOVE) ×4 IMPLANT
GLOVE BIOGEL PI IND STRL 6.5 (GLOVE) IMPLANT
GLOVE BIOGEL PI INDICATOR 6.5 (GLOVE) ×1
GOWN STRL REUS W/ TWL LRG LVL3 (GOWN DISPOSABLE) ×2 IMPLANT
GOWN STRL REUS W/TWL LRG LVL3 (GOWN DISPOSABLE) ×8
KIT BASIN OR (CUSTOM PROCEDURE TRAY) ×2 IMPLANT
KIT PLEURX DRAIN CATH 1000ML (MISCELLANEOUS) ×3 IMPLANT
KIT PLEURX DRAIN CATH 15.5FR (DRAIN) ×2 IMPLANT
KIT ROOM TURNOVER OR (KITS) ×2 IMPLANT
NS IRRIG 1000ML POUR BTL (IV SOLUTION) ×2 IMPLANT
PACK GENERAL/GYN (CUSTOM PROCEDURE TRAY) ×2 IMPLANT
PAD ARMBOARD 7.5X6 YLW CONV (MISCELLANEOUS) ×4 IMPLANT
SET DRAINAGE LINE (MISCELLANEOUS) IMPLANT
SPONGE GAUZE 4X4 12PLY STER LF (GAUZE/BANDAGES/DRESSINGS) ×1 IMPLANT
SUT ETHILON 3 0 FSL (SUTURE) ×2 IMPLANT
SUT VIC AB 3-0 X1 27 (SUTURE) ×2 IMPLANT
TOWEL OR 17X24 6PK STRL BLUE (TOWEL DISPOSABLE) ×2 IMPLANT
TOWEL OR 17X26 10 PK STRL BLUE (TOWEL DISPOSABLE) ×2 IMPLANT
VALVE REPLACEMENT CAP (MISCELLANEOUS) IMPLANT
WATER STERILE IRR 1000ML POUR (IV SOLUTION) ×2 IMPLANT

## 2015-03-04 NOTE — Anesthesia Postprocedure Evaluation (Signed)
  Anesthesia Post-op Note  Patient: [facility]  Procedure(s) Performed: Procedure(s): RIGHT PLEURX CATHETER PLACEMENT (Right)  Patient Location: PACU  Anesthesia Type: MAC  Level of Consciousness: awake and alert   Airway and Oxygen Therapy: Patient Spontanous Breathing  Post-op Pain: Controlled  Post-op Assessment: Post-op Vital signs reviewed, Patient's Cardiovascular Status Stable and Respiratory Function Stable  Post-op Vital Signs: Reviewed  Filed Vitals:   03/04/15 0930  BP: 96/45  Pulse: 103  Temp:   Resp: 15    Complications: No apparent anesthesia complications

## 2015-03-04 NOTE — ED Provider Notes (Signed)
CSN: [id number]     Arrival date & time 03/03/15  1322 History   First MD Initiated Contact with Patient 03/03/15 1505     Chief Complaint  Patient presents with  . 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 Patient states that she was seen by her oncologist at [facility] 1 week ago and was diagnosed with pneumonia. She is currently on Levaquin. However patient states her shortness of breath has continually gotten worse. Patient also complaining of pain in her right chest upon deep inspiration. Denies fever, chills, dizziness, lightheadedness, numbness, tingling, headache, blurry vision, sore throat, congestion, dysuria, hematuria.  Patient is a 64 y.o. female presenting with shortness of breath.  Shortness of Breath   Past Medical History  Diagnosis Date  . Anemia     years ago  . Transfusion history     before her hysterectomy  . Anxiety   . Hyperlipidemia   . Hypertension   . Spinal stenosis   . Spondylosis     degenerative adult  . Bulging lumbar disc   . MONONUCLEOSIS 08/14/2007    Qualifier: Diagnosis of  Problem Stop Reason: Removed By: Hulan Saas, CMA (AAMA), [place] Skye   . Vertigo   . Iron disorder     excessive iron storage   . Cancer [facility])    Past Surgical History  Procedure Laterality Date  . Inguinal hernia repair      at birth  . Cesarean section      x 2  . Appendectomy    . Abdominal hysterectomy      bleeding fibroid tumors has ovaries  . Vein protrusion in head as a child    . Blood transfusion from vaginal bleeding     Family History  Problem Relation Age of Onset  . Hypertension Mother   . Arthritis Mother   .  Hyperlipidemia Mother   . Memory loss Mother 45    short term  . Other Mother 26    angioplasty  . Arthritis Father   . Hypertension Father   . Heart attack Father   . Hyperthyroidism Sister   . Anemia Sister   . Hypothyroidism Sister   . Hyperlipidemia Sister   . Colon cancer Neg Hx   . Esophageal cancer Neg Hx   . Rectal cancer Neg Hx   . Stomach cancer Neg Hx    Social History  Substance Use Topics  . Smoking status: Former Smoker    Quit date: 05/17/1994  . Smokeless tobacco: Never Used  . Alcohol Use: Yes     Comment: rare   OB History    No data available     Review of Systems  Respiratory: Positive for shortness of breath.   All other systems reviewed and are negative.     Allergies  Buprenorphine hcl; Hydrocodone; Morphine and related; Oxycodone; Tramadol; Naproxen sodium; Penicillins; and Sulfonamide derivatives  Home Medications   Prior to Admission medications   Medication Sig Start Date End Date Taking? Authorizing Provider  acetaminophen (TYLENOL) 500 MG tablet Take 500 mg by mouth every 6 (six) hours as needed (For pain.).   Yes Historical Provider, MD  amLODipine (NORVASC) 10 MG tablet TAKE 1 TABLET BY MOUTH DAILY 01/07/15  Yes Mariann Laster  Darnelle Going, MD  carvedilol (COREG) 3.125 MG tablet Take 1 tablet (3.125 mg total) by mouth 2 (two) times daily with a meal. 02/18/15  Yes Burnis Medin, MD  levofloxacin (LEVAQUIN) 500 MG tablet Take 500 mg by mouth daily. Take for 10 days. Husband states she has taken 7 days worth of medication already as of 03-03-15   Yes Historical Provider, MD  levothyroxine (SYNTHROID, LEVOTHROID) 88 MCG tablet Take 88 mcg by mouth daily before breakfast.   Yes Historical Provider, MD  LORazepam (ATIVAN) 0.5 MG tablet Take 0.5 mg by mouth every 6 (six) hours as needed. For nausea per husband 09/12/14  Yes Historical Provider, MD  ondansetron (ZOFRAN) 4 MG tablet Take 4 mg by mouth every 8 (eight) hours as needed for nausea or vomiting.   09/20/14  Yes Historical Provider, MD  promethazine (PHENERGAN) 25 MG tablet Take 1 tablet (25 mg total) by mouth every 6 (six) hours as needed for nausea. 11/18/14  Yes Varney Biles, MD  rosuvastatin (CRESTOR) 10 MG tablet Take 10 mg by mouth daily with supper.   Yes Historical Provider, MD  levothyroxine (SYNTHROID, LEVOTHROID) 88 MCG tablet Take 1 tablet (88 mcg total) by mouth daily. Patient not taking: Reported on 03/03/2015 12/31/14   Burnis Medin, MD   BP 109/37 mmHg  Pulse 104  Temp(Src) 98.1 F (36.7 C) (Oral)  Resp 30  Ht 4\' 11"  (1.499 m)  Wt 128 lb 1.4 oz (58.1 kg)  BMI 25.86 kg/m2  SpO2 94% Physical Exam  Constitutional: She is oriented to person, place, and time. She appears well-developed and well-nourished. No distress.  HENT:  Head: Normocephalic and atraumatic.  Mouth/Throat: Oropharynx is clear and moist. No oropharyngeal exudate.  Eyes: Conjunctivae and EOM are normal. Pupils are equal, round, and reactive to light. Right eye exhibits no discharge. Left eye exhibits no discharge. No scleral icterus.  Neck: Normal range of motion. Neck supple.  Cardiovascular: Regular rhythm, normal heart sounds and intact distal pulses.  Exam reveals no gallop and no friction rub.   No murmur heard. Tachycardic to 122 bpm.  Pulmonary/Chest: Effort normal. No respiratory distress. She has no wheezes. She has no rales. She exhibits no tenderness.  Diminished breath sounds in right upper and right lower lung bases. Patient is using a sensory muscle use to breathe. Respiratory rate increased at over 30.  Abdominal: Soft. She exhibits no distension. There is no tenderness. There is no guarding.  Musculoskeletal: Normal range of motion. She exhibits no edema.  Lymphadenopathy:    She has no cervical adenopathy.  Neurological: She is alert and oriented to person, place, and time. No cranial nerve deficit.  Strength 5/5 throughout. No sensory deficits.  No gait abnormality.  Skin: Skin is  warm and dry. No rash noted. She is not diaphoretic. No erythema. No pallor.  Psychiatric: She has a normal mood and affect. Her behavior is normal.  Nursing note and vitals reviewed.   ED Course  Procedures (including critical care time) Labs Review Labs Reviewed  BASIC METABOLIC PANEL - Abnormal; Notable for the following:    Glucose, Bld 129 (*)    Creatinine, Ser 1.51 (*)    Calcium 7.9 (*)    GFR calc non Af Amer 35 (*)    GFR calc Af Amer 41 (*)    All other components within normal limits  CBC - Abnormal; Notable for the following:    WBC 11.0 (*)    RDW 16.2 (*)  Platelets 490 (*)    All other components within normal limits  HEPATIC FUNCTION PANEL - Abnormal; Notable for the following:    Total Protein 5.3 (*)    Albumin 1.5 (*)    AST 46 (*)    Alkaline Phosphatase 193 (*)    All other components within normal limits  COMPREHENSIVE METABOLIC PANEL - Abnormal; Notable for the following:    Sodium 131 (*)    Potassium 3.3 (*)    Chloride 97 (*)    Glucose, Bld 106 (*)    Creatinine, Ser 1.86 (*)    Calcium 7.4 (*)    Total Protein 5.6 (*)    Albumin 1.5 (*)    AST 52 (*)    Alkaline Phosphatase 190 (*)    GFR calc non Af Amer 27 (*)    GFR calc Af Amer 32 (*)    All other components within normal limits  CBC - Abnormal; Notable for the following:    WBC 11.4 (*)    HCT 35.8 (*)    RDW 16.2 (*)    Platelets 448 (*)    All other components within normal limits  PROTIME-INR - Abnormal; Notable for the following:    Prothrombin Time 15.6 (*)    All other components within normal limits  APTT - Abnormal; Notable for the following:    aPTT 38 (*)    All other components within normal limits  BASIC METABOLIC PANEL - Abnormal; Notable for the following:    Potassium 3.4 (*)    Glucose, Bld 107 (*)    Creatinine, Ser 1.72 (*)    Calcium 7.6 (*)    GFR calc non Af Amer 30 (*)    GFR calc Af Amer 35 (*)    All other components within normal limits  CBC -  Abnormal; Notable for the following:    WBC 11.6 (*)    RBC 3.66 (*)    Hemoglobin 11.3 (*)    HCT 33.5 (*)    RDW 16.4 (*)    Platelets 451 (*)    All other components within normal limits  MRSA PCR SCREENING  LACTIC ACID, PLASMA  PROCALCITONIN  TSH  LACTATE DEHYDROGENASE  PROTEIN, TOTAL  BODY FLUID CELL COUNT WITH DIFFERENTIAL  GLUCOSE, SEROUS FLUID  I-STAT TROPOININ, ED  CYTOLOGY - NON PAP    Imaging Review Dg Chest 2 View  03/03/2015  CLINICAL DATA:  Shortness of breath. Metastatic renal cell carcinoma. EXAM: CHEST  2 VIEW COMPARISON:  11/18/2014 chest radiograph FINDINGS: There is a large right pleural effusion, which is new. No left pleural effusion. No pneumothorax. Stable cardiomediastinal silhouette with normal heart size. There are patchy nodular opacities throughout both lungs, in keeping with known pulmonary metastatic disease, with slightly increased number and size of pulmonary nodules, largest 1.6 cm in the left upper lung. There is patchy consolidation in the mid to lower right lung. IMPRESSION: 1. New large right pleural effusion. 2. Patchy consolidation in the mid to lower right lung, likely a combination of atelectasis, pneumonia and/or tumor. 3. Slight progression of pulmonary metastatic disease in both lungs. Electronically Signed   By: Ilona Sorrel M.D.   On: 03/03/2015 14:13   I have personally reviewed and evaluated these images and lab results as part of my medical decision-making.   EKG Interpretation   Date/Time:  Monday March 03 2015 13:35:47 EDT Ventricular Rate:  124 PR Interval:  118 QRS Duration: 76 QT Interval:  280 QTC  Calculation: 402 R Axis:   73 Text Interpretation:  Sinus tachycardia Low voltage QRS Cannot rule out  Anterior infarct , age undetermined T wave abnormality, consider inferior  ischemia Abnormal ECG Confirmed by [facility] MD, ERIN ([id number]) on  03/03/2015 3:10:19 PM      MDM   Final diagnoses:  Pleural effusion  Renal  cell cancer, unspecified laterality (Reform)    Patient with renal cell carcinoma with metastases to the lungs presents with increased short of breath over the last week. Patient currently on Levaquin for recent diagnosis of pneumonia. Afebrile. Patient with increased work of breathing, using assessory muscles. Chest x-ray reveals large right pleural effusion that is new from 1 week ago. Suspect this is secondary to lung metastases.   Spoke with CT surgery who will consult patient in ED. They will place chest tube. Recommend admission to hospitalist service for additional medical problems.  Spoke with hospitalist who will admit patient to stepdown.  Patient was discussed with and seen by Dr. Billy Fischer who agrees with the treatment plan.      Dondra Spry [place], PA-C 03/04/15 [id number]  Gareth Morgan, MD 03/06/15 1538

## 2015-03-04 NOTE — Anesthesia Procedure Notes (Signed)
Procedure Name: MAC Date/Time: 03/04/2015 7:30 AM Performed by: Layla Maw Pre-anesthesia Checklist: Patient identified, Timeout performed, Emergency Drugs available, Suction available and Patient being monitored Patient Re-evaluated:Patient Re-evaluated prior to inductionOxygen Delivery Method: Simple face mask Number of attempts: 1

## 2015-03-04 NOTE — Anesthesia Preprocedure Evaluation (Addendum)
Anesthesia Evaluation  Patient identified by MRN, date of birth, ID band Patient awake    Reviewed: Allergy & Precautions, H&P , NPO status , Patient's Chart, lab work & pertinent test results, reviewed documented beta blocker date and time   Airway Mallampati: II  TM Distance: >3 FB Neck ROM: Full    Dental no notable dental hx. (+) Teeth Intact, Dental Advisory Given   Pulmonary neg pulmonary ROS, former smoker,    Pulmonary exam normal breath sounds clear to auscultation       Cardiovascular hypertension, Pt. on medications and Pt. on home beta blockers  Rhythm:Regular Rate:Normal     Neuro/Psych Anxiety negative neurological ROS  negative psych ROS   GI/Hepatic negative GI ROS, Neg liver ROS,   Endo/Other  Hypothyroidism   Renal/GU Renal InsufficiencyRenal diseaseH/o Renal cell CA  negative genitourinary   Musculoskeletal  (+) Arthritis , Osteoarthritis,    Abdominal   Peds  Hematology negative hematology ROS (+) anemia ,   Anesthesia Other Findings   Reproductive/Obstetrics negative OB ROS                            Anesthesia Physical Anesthesia Plan  ASA: III  Anesthesia Plan: MAC   Post-op Pain Management:    Induction: Intravenous  Airway Management Planned: Simple Face Mask  Additional Equipment:   Intra-op Plan:   Post-operative Plan:   Informed Consent: I have reviewed the patients History and Physical, chart, labs and discussed the procedure including the risks, benefits and alternatives for the proposed anesthesia with the patient or authorized representative who has indicated his/her understanding and acceptance.   Dental advisory given  Plan Discussed with: CRNA and Surgeon  Anesthesia Plan Comments:        Anesthesia Quick Evaluation

## 2015-03-04 NOTE — Transfer of Care (Signed)
Immediate Anesthesia Transfer of Care Note  Patient: Kendra Mcfarland  Procedure(s) Performed: Procedure(s): RIGHT PLEURX CATHETER PLACEMENT (Right)  Patient Location: PACU  Anesthesia Type:MAC  Level of Consciousness: awake, alert  and oriented  Airway & Oxygen Therapy: Patient Spontanous Breathing and Patient connected to nasal cannula oxygen  Post-op Assessment: Report given to RN and Post -op Vital signs reviewed and stable  Post vital signs: Reviewed and stable  Last Vitals:  Filed Vitals:   03/04/15 0846  BP: 109/37  Pulse: 104  Temp: 36.7 C  Resp: 30    Complications: No apparent anesthesia complications

## 2015-03-04 NOTE — Progress Notes (Signed)
ANTIBIOTIC CONSULT NOTE - INITIAL  Pharmacy Consult for Vancomycin ans Zosyn Indication: parapneumonic effusion  Allergies  Allergen Reactions  . Buprenorphine Hcl Nausea And Vomiting  . Hydrocodone Nausea And Vomiting  . Morphine And Related Nausea And Vomiting  . Oxycodone Nausea And Vomiting  . Tramadol Nausea And Vomiting  . Naproxen Sodium Hives, Itching, Rash and Other (See Comments)    Can take advil   . Penicillins     Has patient had a PCN reaction causing immediate rash, facial/tongue/throat swelling, SOB or lightheadedness with hypotension: NO Has patient had a PCN reaction causing severe rash involving mucus membranes or skin necrosis:NO Has patient had a PCN reaction that required hospitalizationNO Has patient had a PCN reaction occurring within the last 10 years:NO If all of the above answers are "NO", then may proceed with Cephalosporin use.  . Sulfonamide Derivatives Hives, Itching, Rash and Other (See Comments)    dypsnea also    Patient Measurements: Height: 4\' 11"  (149.9 cm) Weight: 128 lb 1.4 oz (58.1 kg) IBW/kg (Calculated) : 43.2  Vital Signs: Temp: 97.5 F (36.4 C) (10/18 1040) Temp Source: Oral (10/18 1040) BP: 106/44 mmHg (10/18 1040) Pulse Rate: 106 (10/18 1040) Intake/Output from previous day: 10/17 0701 - 10/18 0700 In: 1082.5 [P.O.:120; I.V.:862.5; IV Piggyback:100] Out: 500 [Urine:500] Intake/Output from this shift: Total I/O In: 600 [I.V.:600] Out: 1 [Urine:1]  Labs:  Recent Labs  03/03/15 1405 03/03/15 1850 03/04/15 0320  WBC 11.0* 11.4* 11.6*  HGB 13.3 12.6 11.3*  PLT 490* 448* 451*  CREATININE 1.51* 1.86* 1.72*   Estimated Creatinine Clearance: 25.3 mL/min (by C-G formula based on Cr of 1.72).    Microbiology: Recent Results (from the past 720 hour(s))  MRSA PCR Screening     Status: None   Collection Time: 03/03/15  6:15 PM  Result Value Ref Range Status   MRSA by PCR NEGATIVE NEGATIVE Final    Comment:        The  GeneXpert MRSA Assay (FDA approved for NASAL specimens only), is one component of a comprehensive MRSA colonization surveillance program. It is not intended to diagnose MRSA infection nor to guide or monitor treatment for MRSA infections.     Medical History: Past Medical History  Diagnosis Date  . Anemia     years ago  . Transfusion history     before her hysterectomy  . Anxiety   . Hyperlipidemia   . Hypertension   . Spinal stenosis   . Spondylosis     degenerative adult  . Bulging lumbar disc   . MONONUCLEOSIS 08/14/2007    Qualifier: Diagnosis of  Problem Stop Reason: Removed By: Hulan Saas, CMA (AAMA), [place]a Skye   . Vertigo   . Iron disorder     excessive iron storage   . Cancer (HCC)     Medications:  Scheduled:  . heparin  5,000 Units Subcutaneous 3 times per day  . levothyroxine  88 mcg Oral QAC breakfast  . sodium chloride  3 mL Intravenous Q12H   Infusions:  . sodium chloride 75 mL/hr at 03/03/15 1830   Assessment: 64 yo F presented to ED with worsening SOB x  10 days.  Pt was seen by her PCP and given a Rx for Levaquin without improvement.  CXR in ED showed large right pleural effusion.  Pleurex catheter placed 10/18.  To start Vanc and Zosyn for parapneumonic effusion.  Noted some baseline renal dysfunction, SCr slightly worse today.  CrCl ~ 30 ml/min.  PMH: HTN, HLD, metastatic renal cell carcinoma (chemo at [facility] on hold)  Goal of Therapy:  Vancomycin trough level 15-20 mcg/ml Renal dose adjustment of antibiotics  Plan:  Vancomycin 750 mg IV q24h Zosyn 3.375 gm IV q8h (4 hour infusion). Follow up culture data, clinical progress, and renal function. Vancomycin level as indicated.  [employer], Pharm.D., BCPS Clinical Pharmacist Pager [phone] 03/04/2015 11:33 AM

## 2015-03-04 NOTE — Care Management Note (Signed)
Case Management Note  Patient Details  Name: HIILANI JETTER MRN: [id number] Date of Birth: 1950-10-20  Subjective/Objective:             Admitted with SOB/Pleural Effusion, s/p RIGHT PLEURX CATHETER PLACEMENT (Right) and drainage of effusion. Hx of metastatic renal cell carcinoma with metastases to the lungs, S/P left-sided nephrectomy, currently undergoing chemotherapy. Independent with ADL's.   Action/Plan:  Return to home when medically stable. CM to f/u with d/c needs. Expected Discharge Date:                  Expected Discharge Plan:  [place]  In-House Referral:     Discharge planning Services  CM Consult  Post Acute Care Choice:    Choice offered to:     DME Arranged:    DME Agency:     HH Arranged:   RN [place] Agency:   [facility]  Status of Service:  In process, will continue to follow  Medicare Important Message Given:    Date Medicare IM Given:    Medicare IM give by:    Date Additional Medicare IM Given:    Additional Medicare Important Message give by:     If discussed at [place] of Stay Meetings, dates discussed:    Additional Comments: Shrinika Blatz (Spouse) [phone]   Whitman Hero [place], [place] [phone] 03/04/2015, 12:01 PM

## 2015-03-04 NOTE — Progress Notes (Signed)
Initial Nutrition Assessment  DOCUMENTATION CODES:   Non-severe (moderate) malnutrition in context of chronic illness  INTERVENTION:    Boost Breeze po TID, each supplement provides 250 kcal and 9 grams of protein  Magic cup TID with meals, each supplement provides 290 kcal and 9 grams of protein  NUTRITION DIAGNOSIS:   Malnutrition related to chronic illness as evidenced by energy intake < 75% for > or equal to 1 month, mild depletion of muscle mass.  GOAL:   Patient will meet greater than or equal to 90% of their needs  MONITOR:   PO intake, Supplement acceptance, Weight trends, Labs, I & O's  REASON FOR ASSESSMENT:   Consult Poor PO  ASSESSMENT:   64 year old female with hypertension, hyperlipidemia, metastatic renal cell carcinoma, metastasized to lungs, receiving chemotherapy at [facility] was recently started on Sutent presented to ED with worsening shortness of breath.  Patient and her husband report that she has been eating very poorly lately since being started on her cancer medication, which makes everything taste bad. She has tried several PO supplements and doesn't like them. Agreed to try Boost Breeze between meals and magic cups with meals. She has lost ~9% of her usual weight in the past 6 months.  Nutrition-Focused physical exam completed. Findings are no fat depletion, mild-moderate muscle depletion, and no edema.  Patient with moderate malnutrition in the context of chronic illness, given intake <75% of estimated energy requirement for > 1 month and mild depletion of muscle mass.  Diet Order:  Diet regular Room service appropriate?: Yes; Fluid consistency:: Thin  Skin:  Reviewed, no issues  Last BM:  unknown  Height:   Ht Readings from Last 1 Encounters:  03/03/15 4\' 11"  (1.499 m)    Weight:   Wt Readings from Last 1 Encounters:  03/03/15 128 lb 1.4 oz (58.1 kg)    Ideal Body Weight:  44.5 kg  BMI:  Body mass index is 25.86 kg/(m^2).  Estimated  Nutritional Needs:   Kcal:  1550-1750  Protein:  75-90 gm  Fluid:  1.5-1.8 L  EDUCATION NEEDS:   Education needs addressed  Molli Barrows, [place], [place], [place] Pager [phone] After Hours Pager [phone]

## 2015-03-04 NOTE — Brief Op Note (Addendum)
      [address]       [place] [address]             [phone]      03/04/2015  8:38 AM  PATIENT:  Kendra Mcfarland  64 y.o. female  PRE-OPERATIVE DIAGNOSIS:  Pleural Effusion   POST-OPERATIVE DIAGNOSIS:  Pleural Effusion   PROCEDURE:  Procedure(s): RIGHT PLEURX CATHETER PLACEMENT (Right) and drainage of effusion With fluro and ultrasound guidance   SURGEON:  Surgeon(s) and Role:    * Grace Isaac, MD - Primary    ANESTHESIA:   MAC  EBL:  Total I/O In: 600 [I.V.:600] Out: -   BLOOD ADMINISTERED:none  DRAINS: pleurix on the right  LOCAL MEDICATIONS USED:  LIDOCAINE  and Amount: 8 ml  SPECIMEN:  Source of Specimen:  right pleuraql effusion  DISPOSITION OF SPECIMEN:  PATHOLOGY  COUNTS:  YES   DICTATION: .Dragon Dictation  PLAN OF CARE: patient already in pateint   PATIENT DISPOSITION:  PACU - hemodynamically stable.   Delay start of Pharmacological VTE agent (>24hrs) due to surgical blood loss or risk of bleeding: no  2 liters bloody pleural effusion drained

## 2015-03-04 NOTE — Progress Notes (Signed)
Pt transported to OR on monitor with OCT. BP 109/39, SR 96, tachypneic 32-40, T-max 98.2. SPO2 92-94% on 4L [place]. Pt denies any shortness of breath or discomfort at this time. Report given to CRNA over phone.

## 2015-03-04 NOTE — Progress Notes (Signed)
[facility]                                                                              Patient Demographics  Kendra Mcfarland, is a 64 y.o. female, DOB - [birth year], VOH:[id number]  Admit date - 03/03/2015   Admitting Physician Wen Munford Krystal Eaton, MD  Outpatient Primary MD for the patient is Lottie Dawson, MD  LOS - 1   Chief Complaint  Patient presents with  . 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 History was obtained from the patient and her husband in the room. Patient reported that she noticed shortness of breath in the last 10 days, progressively worsening in the last 3 days. Patient went to her PCP and was treated with oral Levaquin for possible pneumonia. She denies any fevers or chills, productive cough, any vomiting or aspiration. Patient noticed progressively worsening shortness of breath, sitting upright, having some right-sided chest pain, worse on sitting up. ED workup showed BMET unremarkable except BUN 14, creatinine 1.5, CBC showed white count of 11.0, otherwise unremarkable. Troponin negative. Chest x-ray showed new large right pleural effusion with patchy consolidation in the mid to lower right lung likely combination of atelectasis pneumonia or tumor, slight progression of pulmonary metastatic disease to both lungs. Vital signs in ED SBP in 80s, heart rate 110-120's    Assessment & Plan    Principal Problem: Right-sided Pleural effusion likely malignant pleural effusion secondary to metastatic renal cell carcinoma to lungs -  Cardiothoracic surgery consulted has been consulted, patient underwent right Pleurx catheter placement and drainage of effusion  - Follow pleural fluid studies and cultures, pleural fluid LDH  1092, turbid, WBC is 493 with 80% neutrophils. Place on IV vancomycin and Zosyn for parapneumonic effusion, await culture results.  Active Problems:  Metastatic renal cell carcinoma ([place]), metastasis to both lungs - Chemotherapy currently on hold.    Hypothyroidism -TSH 0.38  continue Synthroid   Hypotension -  improving today, continue to hold the Coreg and amlodipine   Acute kidney injury ([place]) with underlying history of renal cell carcinoma - Patient presented with creatinine of 1.8, baseline 1.2-1.3, likely some dehydration  - Creatinine improving to 1.7 today   Hypokalemia Replaced  Severe protein calorie malnutrition, hypoalbuminemia  - Albumin 1.5, received albumin infusion at admission - Nutrition consult  DVT prophylaxis: heparin subcutaneous   CODE STATUS: full CODE STATUS    Family Communication: Discussed in detail with the patient, all imaging results, lab results explained to the patient and husband    Disposition Plan: Not medically ready   Time Spent in minutes  25tes  Procedures  Pleurx catheter and effusion drainage  Consults    cardiothoracic surgery  DVT Prophylaxis  heparin subcutaneous  Medications   Scheduled Meds: . heparin  5,000 Units Subcutaneous 3 times per day  . levothyroxine  88 mcg Oral QAC breakfast  . piperacillin-tazobactam (ZOSYN)  IV  3.375  g Intravenous 3 times per day  . sodium chloride  3 mL Intravenous Q12H  . vancomycin  750 mg Intravenous Q24H   Continuous Infusions: . sodium chloride 75 mL/hr at 03/04/15 1100   PRN Meds:.acetaminophen, alum & mag hydroxide-simeth, LORazepam, ondansetron **OR** ondansetron (ZOFRAN) IV   Antibiotics   Anti-infectives    Start     Dose/Rate Route Frequency Ordered Stop   03/04/15 1400  piperacillin-tazobactam (ZOSYN) IVPB 3.375 g     3.375 g 12.5 mL/hr over 240 Minutes Intravenous 3 times per day 03/04/15 1141     03/04/15 1200  vancomycin (VANCOCIN) IVPB 750 mg/150 ml  premix     750 mg 150 mL/hr over 60 Minutes Intravenous Every 24 hours 03/04/15 1140     03/04/15 0715  cefUROXime (ZINACEF) 1.5 g in dextrose 5 % 50 mL IVPB  Status:  Discontinued     1.5 g 100 mL/hr over 30 Minutes Intravenous To Surgery 03/04/15 0710 03/04/15 1033   03/03/15 1802  cefUROXime (ZINACEF) 1.5 g in dextrose 5 % 50 mL IVPB     1.5 g 100 mL/hr over 30 Minutes Intravenous 60 min pre-op 03/03/15 1802 03/04/15 0747        Subjective:   Jeree Delcid was seen and examined today.  Seen after the Pleurx and effusion drainage. Doing well, feels a sore otherwise alert and oriented, hungry. Patient denies dizziness,  N/V/D/C, new weakness, numbess, tingling. No acute events overnight.    Objective:   Blood pressure 106/44, pulse 106, temperature 97.5 F (36.4 C), temperature source Oral, resp. rate 24, height 4\' 11"  (1.499 m), weight 58.1 kg (128 lb 1.4 oz), SpO2 93 %.  Wt Readings from Last 3 Encounters:  03/03/15 58.1 kg (128 lb 1.4 oz)  12/31/14 59.421 kg (131 lb)  10/24/14 62.506 kg (137 lb 12.8 oz)     Intake/Output Summary (Last 24 hours) at 03/04/15 1204 Last data filed at 03/04/15 1040  Gross per 24 hour  Intake 1682.5 ml  Output    501 ml  Net 1181.5 ml    Exam  General: Alert and oriented x 3, NAD  HEENT:  PERRLA, EOMI, Anicteric Sclera, mucous membranes moist.   Neck: Supple, no JVD, no masses  CVS: S1 S2 auscultated, no rubs, murmurs or gallops. Regular rate and rhythm.  Respiratory: Diminished breath sounds  Abdomen : Soft, nontender, nondistended, + bowel sounds  Ext: no cyanosis clubbing or edema  Neuro: no new deficits   Skin: No rashes  Psych: Normal affect and demeanor, alert and oriented x3    Data Review   Micro Results Recent Results (from the past 240 hour(s))  MRSA PCR Screening     Status: None   Collection Time: 03/03/15  6:15 PM  Result Value Ref Range Status   MRSA by PCR NEGATIVE NEGATIVE Final    Comment:        The  GeneXpert MRSA Assay (FDA approved for NASAL specimens only), is one component of a comprehensive MRSA colonization surveillance program. It is not intended to diagnose MRSA infection nor to guide or monitor treatment for MRSA infections.     Radiology Reports Dg Chest 2 View  03/03/2015  CLINICAL DATA:  Shortness of breath. Metastatic renal cell carcinoma. EXAM: CHEST  2 VIEW COMPARISON:  11/18/2014 chest radiograph FINDINGS: There is a large right pleural effusion, which is new. No left pleural effusion. No pneumothorax. Stable cardiomediastinal silhouette with normal heart size. There are patchy nodular opacities throughout  both lungs, in keeping with known pulmonary metastatic disease, with slightly increased number and size of pulmonary nodules, largest 1.6 cm in the left upper lung. There is patchy consolidation in the mid to lower right lung. IMPRESSION: 1. New large right pleural effusion. 2. Patchy consolidation in the mid to lower right lung, likely a combination of atelectasis, pneumonia and/or tumor. 3. Slight progression of pulmonary metastatic disease in both lungs. Electronically Signed   By: Ilona Sorrel M.D.   On: 03/03/2015 14:13   Dg Chest Port 1 View  03/04/2015  CLINICAL DATA:  PleurX catheter placement EXAM: PORTABLE CHEST 1 VIEW COMPARISON:  Yesterday FINDINGS: Right chest tube placed. Right pleural effusion improved. No pneumothorax. Ill-defined opacities compatible with metastatic disease are seen throughout both lungs. Normal heart size. IMPRESSION: Right chest tube placed, PleurX. Right pleural effusion improved and no pneumothorax. Otherwise stable study. Electronically Signed   By: Marybelle Killings M.D.   On: 03/04/2015 09:46   Dg C-arm 1-60 Min-no Report  03/04/2015  CLINICAL DATA: Intra-op; right pleural catheter placement C-ARM 1-60 MINUTES Fluoroscopy was utilized by the requesting physician.  No radiographic interpretation.    CBC  Recent Labs Lab  03/03/15 1405 03/03/15 1850 03/04/15 0320  WBC 11.0* 11.4* 11.6*  HGB 13.3 12.6 11.3*  HCT 38.4 35.8* 33.5*  PLT 490* 448* 451*  MCV 91.9 91.3 91.5  MCH 31.8 32.1 30.9  MCHC 34.6 35.2 33.7  RDW 16.2* 16.2* 16.4*    Chemistries   Recent Labs Lab 03/03/15 1405 03/03/15 1850 03/04/15 0320  NA 140 131* 137  K 3.5 3.3* 3.4*  CL 102 97* 101  CO2 28 23 26   GLUCOSE 129* 106* 107*  BUN 14 17 16   CREATININE 1.51* 1.86* 1.72*  CALCIUM 7.9* 7.4* 7.6*  AST  --  46*  52*  --   ALT  --  30  29  --   ALKPHOS  --  193*  190*  --   BILITOT  --  0.5  0.5  --    ------------------------------------------------------------------------------------------------------------------ estimated creatinine clearance is 25.3 mL/min (by C-G formula based on Cr of 1.72). ------------------------------------------------------------------------------------------------------------------ No results for input(s): HGBA1C in the last 72 hours. ------------------------------------------------------------------------------------------------------------------ No results for input(s): CHOL, HDL, LDLCALC, TRIG, CHOLHDL, LDLDIRECT in the last 72 hours. ------------------------------------------------------------------------------------------------------------------  Recent Labs  03/03/15 1935  TSH 0.384   ------------------------------------------------------------------------------------------------------------------ No results for input(s): VITAMINB12, FOLATE, FERRITIN, TIBC, IRON, RETICCTPCT in the last 72 hours.  Coagulation profile  Recent Labs Lab 03/03/15 1850  INR 1.22    No results for input(s): DDIMER in the last 72 hours.  Cardiac Enzymes No results for input(s): CKMB, TROPONINI, MYOGLOBIN in the last 168 hours.  Invalid input(s): CK ------------------------------------------------------------------------------------------------------------------ Invalid input(s): POCBNP  No  results for input(s): GLUCAP in the last 72 hours.   Sosaia Pittinger M.D. [facility] 03/04/2015, 12:04 PM  Pager: [phone] Between 7am to 7pm - call Pager - [phone]  After 7pm go to [email or web address] - password [email or web address]  Call night coverage person covering after 7pm

## 2015-03-05 ENCOUNTER — Inpatient Hospital Stay (HOSPITAL_COMMUNITY): Payer: Medicare Other

## 2015-03-05 ENCOUNTER — Encounter (HOSPITAL_COMMUNITY): Payer: Self-pay | Admitting: Cardiothoracic Surgery

## 2015-03-05 LAB — CBC
HEMATOCRIT: 33 % — AB (ref 36.0–46.0)
Hemoglobin: 11.3 g/dL — ABNORMAL LOW (ref 12.0–15.0)
MCH: 32 pg (ref 26.0–34.0)
MCHC: 34.2 g/dL (ref 30.0–36.0)
MCV: 93.5 fL (ref 78.0–100.0)
PLATELETS: 448 10*3/uL — AB (ref 150–400)
RBC: 3.53 MIL/uL — ABNORMAL LOW (ref 3.87–5.11)
RDW: 16.5 % — AB (ref 11.5–15.5)
WBC: 10.2 10*3/uL (ref 4.0–10.5)

## 2015-03-05 LAB — BASIC METABOLIC PANEL
ANION GAP: 9 (ref 5–15)
BUN: 13 mg/dL (ref 6–20)
CALCIUM: 7.3 mg/dL — AB (ref 8.9–10.3)
CO2: 28 mmol/L (ref 22–32)
Chloride: 102 mmol/L (ref 101–111)
Creatinine, Ser: 1.52 mg/dL — ABNORMAL HIGH (ref 0.44–1.00)
GFR calc Af Amer: 40 mL/min — ABNORMAL LOW (ref >60)
GFR, EST NON AFRICAN AMERICAN: 35 mL/min — AB (ref >60)
GLUCOSE: 98 mg/dL (ref 65–99)
POTASSIUM: 2.9 mmol/L — AB (ref 3.5–5.1)
Sodium: 139 mmol/L (ref 135–145)

## 2015-03-05 LAB — POTASSIUM: Potassium: 3.5 mmol/L (ref 3.5–5.1)

## 2015-03-05 LAB — MAGNESIUM: Magnesium: 1.5 mg/dL — ABNORMAL LOW (ref 1.7–2.4)

## 2015-03-05 LAB — PROCALCITONIN: Procalcitonin: 0.5 ng/mL

## 2015-03-05 MED ORDER — FENTANYL CITRATE (PF) 100 MCG/2ML IJ SOLN: 12.5000 ug | INTRAMUSCULAR | Status: DC | PRN

## 2015-03-05 MED ORDER — POTASSIUM CHLORIDE 10 MEQ/100ML IV SOLN
10.0000 meq | INTRAVENOUS | Status: AC
  Administered 2015-03-05: 10 meq via INTRAVENOUS
  Filled 2015-03-05: qty 100

## 2015-03-05 MED ORDER — LIDOCAINE 5 % EX PTCH
1.0000 | MEDICATED_PATCH | CUTANEOUS | Status: DC
  Administered 2015-03-05: 1 via TRANSDERMAL
  Filled 2015-03-05: qty 1

## 2015-03-05 MED ORDER — POTASSIUM CHLORIDE CRYS ER 20 MEQ PO TBCR
40.0000 meq | EXTENDED_RELEASE_TABLET | ORAL | Status: AC
  Administered 2015-03-05 (×2): 40 meq via ORAL
  Filled 2015-03-05 (×2): qty 2

## 2015-03-05 NOTE — Op Note (Signed)
Kendra Mcfarland, GING NO.:  [id number]  MEDICAL RECORD NO.:  [id number]  LOCATION:  3S15C                        FACILITY:  [facility]  PHYSICIAN:  Lanelle Bal, MD    DATE OF BIRTH:  Jan 26, 1951  DATE OF PROCEDURE:  03/04/2015 DATE OF DISCHARGE:                              OPERATIVE REPORT   PREOPERATIVE DIAGNOSIS:  Large right pleural effusion.  POSTOPERATIVE DIAGNOSIS:  Large right pleural effusion.  PROCEDURE PERFORMED:  Placement of right PleurX catheter with drainage of pleural effusion with fluoroscopic and ultrasound guidance.  SURGEON:  Lanelle Bal, M.D.  BRIEF HISTORY:  The patient is a 64 year old female, with known metastatic renal cell carcinoma with multiple pulmonary nodules.  She had been treated at [facility].  Noted last week of increasing shortness of breath and had a small pleural effusion subsequent.  She had increasing shortness of breath and was instructed to come to the emergency room at [facility]  Chest x-ray revealed a large right pleural effusion.  The options of treatment with what is presumed to be a malignant pleural effusion.  Initially, was discussed with the patient and her husband and we proceeded with placement of a right PleurX catheter.  The patient agreed and signed informed consent.  DESCRIPTION OF PROCEDURE:  With the patient under MAC anesthesia, the right chest was prepped with Betadine and draped in usual sterile manner.  Sinus site ultrasound was used to confirm the level and location of the pleural fluid.  Appropriate time-out was performed and the right-sided preoperatively been marked 1% lidocaine, a total of 8 mL was infiltrated.  A 16-gauge needle was introduced into the right pleural space.  A guidewire was placed into the pleural space.  A small counter incision was made anteriorly to the insertion site and the PleurX catheter tunneled.  A dilator was placed over the wire and under fluoroscopy, a peel-away  sheath was introduced into the right chest and the PleurX catheter was introduced into the right chest.  We initially removed 2 L of fluid, which were sent for cytology.  The catheter was secured in place.  A single insertion site was closed with 4-0 subcuticular stitch.  The patient tolerated the procedure without obvious complications.  Sponge and needle counts reported as correct at the completion and she was transferred to the recovery room for postoperative observation.     Lanelle Bal, MD     EG/MEDQ  D:  03/05/2015  T:  03/05/2015  Job:  [id number]

## 2015-03-05 NOTE — Progress Notes (Signed)
Spoke with Dr Servando Snare, received telephone order to drain pleurx drain once tonight, if needed.

## 2015-03-05 NOTE — Progress Notes (Signed)
Notified Dr. Tana Coast of pt refusing potassium runs. Will continue to monitor pt.

## 2015-03-05 NOTE — Progress Notes (Signed)
Notified Dr Tana Coast of pt's increased pain to R side, PRN Tylenol given. Also new orders for Lidocaine and Fentanyl patches and CXR. Pt in no acute distress at this time, VSS.

## 2015-03-05 NOTE — Progress Notes (Addendum)
[address]       [place]  [address]             [phone]      1 Day Post-Op Procedure(s) (LRB): RIGHT PLEURX CATHETER PLACEMENT (Right) Subjective: Feels ok  Objective: Vital signs in last 24 hours: Temp:  [97.5 F (36.4 C)-98.5 F (36.9 C)] 98 F (36.7 C) (10/19 0737) Pulse Rate:  [96-118] 99 (10/19 0740) Cardiac Rhythm:  [-] Sinus tachycardia (10/19 0806) Resp:  [18-26] 18 (10/19 0740) BP: (91-106)/(34-55) 98/43 mmHg (10/19 0740) SpO2:  [92 %-96 %] 92 % (10/19 0740)  Hemodynamic parameters for last 24 hours:    Intake/Output from previous day: 10/18 0701 - 10/19 0700 In: 2015 [P.O.:240; I.V.:1575; IV Piggyback:200] Out: 551 [Urine:551] Intake/Output this shift: Total I/O In: 75 [I.V.:75] Out: -   General appearance: alert, cooperative and no distress Heart: regular rate and rhythm Lungs: dim in bases Wound: dressing CDI  Lab Results:  Recent Labs  03/04/15 0320 03/05/15 0518  WBC 11.6* 10.2  HGB 11.3* 11.3*  HCT 33.5* 33.0*  PLT 451* 448*   BMET:  Recent Labs  03/04/15 0320 03/05/15 0518  NA 137 139  K 3.4* 2.9*  CL 101 102  CO2 26 28  GLUCOSE 107* 98  BUN 16 13  CREATININE 1.72* 1.52*  CALCIUM 7.6* 7.3*    PT/INR:  Recent Labs  03/03/15 1850  LABPROT 15.6*  INR 1.22   ABG No results found for: PHART, HCO3, TCO2, ACIDBASEDEF, O2SAT CBG (last 3)  No results for input(s): GLUCAP in the last 72 hours.  Meds Scheduled Meds: . feeding supplement  1 Container Oral TID BM  . heparin  5,000 Units Subcutaneous 3 times per day  . levothyroxine  88 mcg Oral QAC breakfast  . piperacillin-tazobactam (ZOSYN)  IV  3.375 g Intravenous 3 times per day  . sodium chloride  3 mL Intravenous Q12H  . vancomycin  750 mg Intravenous Q24H   Continuous Infusions: . sodium chloride 75 mL/hr at 03/05/15 0200   PRN Meds:.acetaminophen, alum & mag hydroxide-simeth, LORazepam, ondansetron **OR** ondansetron (ZOFRAN)  IV  Xrays Dg Chest 2 View  03/03/2015  CLINICAL DATA:  Shortness of breath. Metastatic renal cell carcinoma. EXAM: CHEST  2 VIEW COMPARISON:  11/18/2014 chest radiograph FINDINGS: There is a large right pleural effusion, which is new. No left pleural effusion. No pneumothorax. Stable cardiomediastinal silhouette with normal heart size. There are patchy nodular opacities throughout both lungs, in keeping with known pulmonary metastatic disease, with slightly increased number and size of pulmonary nodules, largest 1.6 cm in the left upper lung. There is patchy consolidation in the mid to lower right lung. IMPRESSION: 1. New large right pleural effusion. 2. Patchy consolidation in the mid to lower right lung, likely a combination of atelectasis, pneumonia and/or tumor. 3. Slight progression of pulmonary metastatic disease in both lungs. Electronically Signed   By: Kendra Sorrel M.D.   On: 03/03/2015 14:13   Dg Chest Port 1 View  03/05/2015  CLINICAL DATA:  PleurX catheter drainage, history of renal cell carcinoma and nephrectomy EXAM: PORTABLE CHEST 1 VIEW COMPARISON:  Portable chest x-ray of 03/04/2015, chest x-ray of 11/18/2014, and chest x-ray of 06/19/2014 FINDINGS: The lungs are not as well aerated. A PleurX catheter is again noted at the right lung base with little change in the volume of the right pleural effusion. Nodular lesions are noted throughout the left lung in particular. These nodules  have increased in size in the interval and were not present on the chest x-ray of 06/19/2014, most consistent with increasing metastasis. Compression of the right lung base is noted and infiltrate on the right cannot be excluded. No pneumothorax is seen. Nodularity of the right paratracheal region is most consistent with mediastinal and or hilar adenopathy. Heart size is stable. IMPRESSION: 1. Increasing size of left lung nodular lesions consistent with progressive metastasis to the lungs. 2. Little change in the  right pleural effusion with right PleurX catheter present and volume loss at the right lung base. 3. Suspect mediastinal adenopathy. Electronically Signed   By: Kendra Drape M.D.   On: 03/05/2015 08:02   Dg Chest Port 1 View  03/04/2015  CLINICAL DATA:  PleurX catheter placement EXAM: PORTABLE CHEST 1 VIEW COMPARISON:  Yesterday FINDINGS: Right chest tube placed. Right pleural effusion improved. No pneumothorax. Ill-defined opacities compatible with metastatic disease are seen throughout both lungs. Normal heart size. IMPRESSION: Right chest tube placed, PleurX. Right pleural effusion improved and no pneumothorax. Otherwise stable study. Electronically Signed   By: Kendra Killings M.D.   On: 03/04/2015 09:46   Dg C-arm 1-60 Min-no Report  03/04/2015  CLINICAL DATA: Intra-op; right pleural catheter placement C-ARM 1-60 MINUTES Fluoroscopy was utilized by the requesting physician.  No radiographic interpretation.    Assessment/Plan: S/P Procedure(s) (LRB): RIGHT PLEURX CATHETER PLACEMENT (Right)  1 stable following placement of pleurx, will need to determine drainage schedule 2 other RX/TX per primary svc    LOS: 2 days    Kendra Mcfarland,Kendra Mcfarland 03/05/2015   Plan daily dfrainage for 7 days then mwf depending on drainage amount I have seen and examined Kendra Mcfarland and agree with the above assessment  and plan.  Kendra Mcfarland Beeper [phone] Office [phone] 03/05/2015 6:20 PM

## 2015-03-05 NOTE — Progress Notes (Signed)
[facility]                                                                              Patient Demographics  Kendra Mcfarland, is a 64 y.o. female, DOB - 1950-12-21, MPN:[id number]  Admit date - 03/03/2015   Admitting Physician Carmelia Tiner Krystal Eaton, MD  Outpatient Primary MD for the patient is Lottie Dawson, MD  LOS - 2   Chief Complaint  Patient presents with  . 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 History was obtained from the patient and her husband in the room. Patient reported that she noticed shortness of breath in the last 10 days, progressively worsening in the last 3 days. Patient went to her PCP and was treated with oral Levaquin for possible pneumonia. She denies any fevers or chills, productive cough, any vomiting or aspiration. Patient noticed progressively worsening shortness of breath, sitting upright, having some right-sided chest pain, worse on sitting up. ED workup showed BMET unremarkable except BUN 14, creatinine 1.5, CBC showed white count of 11.0, otherwise unremarkable. Troponin negative. Chest x-ray showed new large right pleural effusion with patchy consolidation in the mid to lower right lung likely combination of atelectasis pneumonia or tumor, slight progression of pulmonary metastatic disease to both lungs. Vital signs in ED SBP in 80s, heart rate 110-120's    Assessment & Plan    Principal Problem: Right-sided Pleural effusion likely malignant pleural effusion secondary to metastatic renal cell carcinoma to lungs -  Cardiothoracic surgery was consulted, patient underwent right Pleurx catheter placement and drainage of effusion  - Follow pleural fluid studies and cultures, pleural fluid LDH 1092, turbid,  WBC is 493 with 80% neutrophils.  - on IV vancomycin and Zosyn for parapneumonic effusion, await culture results.  Active Problems:  Metastatic renal cell carcinoma ([place]), metastasis to both lungs - Chemotherapy currently on hold.    Hypothyroidism -TSH 0.38  continue Synthroid   Hypotension -  improving today, continue to hold the Coreg and amlodipine   Acute kidney injury ([place]) with underlying history of renal cell carcinoma - Patient presented with creatinine of 1.8, baseline 1.2-1.3, likely some dehydration  - Creatinine improving to 1.5 today   Hypokalemia  -  obtain magnesium level, placed on IV and oral replacement   Severe protein calorie malnutrition, hypoalbuminemia  - Albumin 1.5, received albumin infusion at admission - Nutrition consulted  DVT prophylaxis: heparin subcutaneous   CODE STATUS: full CODE STATUS    Family Communication: Discussed in detail with the patient, all imaging results, lab results explained to the patient and husband    Disposition PlanTransfer to telemetry today   Time Spent in minutes  25 mins   Procedures  Pleurx catheter and effusion drainage  Consults    cardiothoracic surgery  DVT Prophylaxis  heparin subcutaneous  Medications   Scheduled Meds: . feeding supplement  1 Container Oral TID BM  . heparin  5,000  Units Subcutaneous 3 times per day  . levothyroxine  88 mcg Oral QAC breakfast  . piperacillin-tazobactam (ZOSYN)  IV  3.375 g Intravenous 3 times per day  . potassium chloride  10 mEq Intravenous Q1 Hr x 3  . potassium chloride  40 mEq Oral Q4H  . sodium chloride  3 mL Intravenous Q12H  . vancomycin  750 mg Intravenous Q24H   Continuous Infusions:   PRN Meds:.acetaminophen, alum & mag hydroxide-simeth, LORazepam, ondansetron **OR** ondansetron (ZOFRAN) IV   Antibiotics   Anti-infectives    Start     Dose/Rate Route Frequency Ordered Stop   03/04/15 1400  piperacillin-tazobactam (ZOSYN) IVPB 3.375 g      3.375 g 12.5 mL/hr over 240 Minutes Intravenous 3 times per day 03/04/15 1141     03/04/15 1200  vancomycin (VANCOCIN) IVPB 750 mg/150 ml premix     750 mg 150 mL/hr over 60 Minutes Intravenous Every 24 hours 03/04/15 1140     03/04/15 0715  cefUROXime (ZINACEF) 1.5 g in dextrose 5 % 50 mL IVPB  Status:  Discontinued     1.5 g 100 mL/hr over 30 Minutes Intravenous To Surgery 03/04/15 0710 03/04/15 1033   03/03/15 1802  cefUROXime (ZINACEF) 1.5 g in dextrose 5 % 50 mL IVPB     1.5 g 100 mL/hr over 30 Minutes Intravenous 60 min pre-op 03/03/15 1802 03/04/15 0747        Subjective:   Kendra Mcfarland was seen and examined today.  doing well, Pleurx catheter site sore otherwise no fevers or chills. Poor appetite. Patient denies dizziness,  N/V/D/C, new weakness, numbess, tingling. No acute events overnight.    Objective:   Blood pressure 98/43, pulse 99, temperature 98 F (36.7 C), temperature source Oral, resp. rate 18, height 4\' 11"  (1.499 m), weight 58.1 kg (128 lb 1.4 oz), SpO2 92 %.  Wt Readings from Last 3 Encounters:  03/03/15 58.1 kg (128 lb 1.4 oz)  12/31/14 59.421 kg (131 lb)  10/24/14 62.506 kg (137 lb 12.8 oz)     Intake/Output Summary (Last 24 hours) at 03/05/15 1146 Last data filed at 03/05/15 0800  Gross per 24 hour  Intake   1490 ml  Output    550 ml  Net    940 ml    Exam  General: Alert and oriented x 3, NAD  HEENT:  PERRLA, EOMI,  Neck: Supple, no JVD, no masses  CVS: S1 S2 auscultated, RRR  Respiratory: Diminished breath sounds, dressing intact   Abdomen : Soft, nontender, nondistended, + bowel sounds  Ext: no cyanosis clubbing or edema  Neuro: no new deficits   Skin: No rashes  Psych: Normal affect and demeanor, alert and oriented x3    Data Review   Micro Results Recent Results (from the past 240 hour(s))  MRSA PCR Screening     Status: None   Collection Time: 03/03/15  6:15 PM  Result Value Ref Range Status   MRSA by PCR  NEGATIVE NEGATIVE Final    Comment:        The GeneXpert MRSA Assay (FDA approved for NASAL specimens only), is one component of a comprehensive MRSA colonization surveillance program. It is not intended to diagnose MRSA infection nor to guide or monitor treatment for MRSA infections.     Radiology Reports Dg Chest 2 View  03/03/2015  CLINICAL DATA:  Shortness of breath. Metastatic renal cell carcinoma. EXAM: CHEST  2 VIEW COMPARISON:  11/18/2014 chest radiograph FINDINGS: There is a  large right pleural effusion, which is new. No left pleural effusion. No pneumothorax. Stable cardiomediastinal silhouette with normal heart size. There are patchy nodular opacities throughout both lungs, in keeping with known pulmonary metastatic disease, with slightly increased number and size of pulmonary nodules, largest 1.6 cm in the left upper lung. There is patchy consolidation in the mid to lower right lung. IMPRESSION: 1. New large right pleural effusion. 2. Patchy consolidation in the mid to lower right lung, likely a combination of atelectasis, pneumonia and/or tumor. 3. Slight progression of pulmonary metastatic disease in both lungs. Electronically Signed   By: Ilona Sorrel M.D.   On: 03/03/2015 14:13   Dg Chest Port 1 View  03/05/2015  CLINICAL DATA:  PleurX catheter drainage, history of renal cell carcinoma and nephrectomy EXAM: PORTABLE CHEST 1 VIEW COMPARISON:  Portable chest x-ray of 03/04/2015, chest x-ray of 11/18/2014, and chest x-ray of 06/19/2014 FINDINGS: The lungs are not as well aerated. A PleurX catheter is again noted at the right lung base with little change in the volume of the right pleural effusion. Nodular lesions are noted throughout the left lung in particular. These nodules have increased in size in the interval and were not present on the chest x-ray of 06/19/2014, most consistent with increasing metastasis. Compression of the right lung base is noted and infiltrate on the right  cannot be excluded. No pneumothorax is seen. Nodularity of the right paratracheal region is most consistent with mediastinal and or hilar adenopathy. Heart size is stable. IMPRESSION: 1. Increasing size of left lung nodular lesions consistent with progressive metastasis to the lungs. 2. Little change in the right pleural effusion with right PleurX catheter present and volume loss at the right lung base. 3. Suspect mediastinal adenopathy. Electronically Signed   By: Ivar Drape M.D.   On: 03/05/2015 08:02   Dg Chest Port 1 View  03/04/2015  CLINICAL DATA:  PleurX catheter placement EXAM: PORTABLE CHEST 1 VIEW COMPARISON:  Yesterday FINDINGS: Right chest tube placed. Right pleural effusion improved. No pneumothorax. Ill-defined opacities compatible with metastatic disease are seen throughout both lungs. Normal heart size. IMPRESSION: Right chest tube placed, PleurX. Right pleural effusion improved and no pneumothorax. Otherwise stable study. Electronically Signed   By: Marybelle Killings M.D.   On: 03/04/2015 09:46   Dg C-arm 1-60 Min-no Report  03/04/2015  CLINICAL DATA: Intra-op; right pleural catheter placement C-ARM 1-60 MINUTES Fluoroscopy was utilized by the requesting physician.  No radiographic interpretation.    CBC  Recent Labs Lab 03/03/15 1405 03/03/15 1850 03/04/15 0320 03/05/15 0518  WBC 11.0* 11.4* 11.6* 10.2  HGB 13.3 12.6 11.3* 11.3*  HCT 38.4 35.8* 33.5* 33.0*  PLT 490* 448* 451* 448*  MCV 91.9 91.3 91.5 93.5  MCH 31.8 32.1 30.9 32.0  MCHC 34.6 35.2 33.7 34.2  RDW 16.2* 16.2* 16.4* 16.5*    Chemistries   Recent Labs Lab 03/03/15 1405 03/03/15 1850 03/04/15 0320 03/05/15 0518  NA 140 131* 137 139  K 3.5 3.3* 3.4* 2.9*  CL 102 97* 101 102  CO2 28 23 26 28   GLUCOSE 129* 106* 107* 98  BUN 14 17 16 13   CREATININE 1.51* 1.86* 1.72* 1.52*  CALCIUM 7.9* 7.4* 7.6* 7.3*  AST  --  46*  52*  --   --   ALT  --  30  29  --   --   ALKPHOS  --  193*  190*  --   --     BILITOT  --  0.5  0.5  --   --    ------------------------------------------------------------------------------------------------------------------ estimated creatinine clearance is 28.7 mL/min (by C-G formula based on Cr of 1.52). ------------------------------------------------------------------------------------------------------------------ No results for input(s): HGBA1C in the last 72 hours. ------------------------------------------------------------------------------------------------------------------ No results for input(s): CHOL, HDL, LDLCALC, TRIG, CHOLHDL, LDLDIRECT in the last 72 hours. ------------------------------------------------------------------------------------------------------------------  Recent Labs  03/03/15 1935  TSH 0.384   ------------------------------------------------------------------------------------------------------------------ No results for input(s): VITAMINB12, FOLATE, FERRITIN, TIBC, IRON, RETICCTPCT in the last 72 hours.  Coagulation profile  Recent Labs Lab 03/03/15 1850  INR 1.22    No results for input(s): DDIMER in the last 72 hours.  Cardiac Enzymes No results for input(s): CKMB, TROPONINI, MYOGLOBIN in the last 168 hours.  Invalid input(s): CK ------------------------------------------------------------------------------------------------------------------ Invalid input(s): POCBNP  No results for input(s): GLUCAP in the last 72 hours.   Letcher Schweikert M.D. [facility] 03/05/2015, 11:46 AM  Pager: [phone] Between 7am to 7pm - call Pager - [phone]  After 7pm go to [email or web address] - password [email or web address]  Call night coverage person covering after 7pm

## 2015-03-05 NOTE — Progress Notes (Signed)
Report called to [facility] on 5W.

## 2015-03-05 NOTE — Progress Notes (Signed)
Paged Jadene Pierini PA for Dr. Tana Coast to see if they are good with transferring pt. Let Dr. Tana Coast know that pt is ok to transfer. Will continue to monitor pt.

## 2015-03-05 NOTE — Progress Notes (Signed)
Attempted to call report

## 2015-03-05 NOTE — Progress Notes (Signed)
Pt transferred to 5W32. Family at bedside and aware of pt transfer.

## 2015-03-06 ENCOUNTER — Encounter: Payer: Self-pay | Admitting: Internal Medicine

## 2015-03-06 LAB — BASIC METABOLIC PANEL
Anion gap: 6 (ref 5–15)
BUN: 12 mg/dL (ref 6–20)
CALCIUM: 7.5 mg/dL — AB (ref 8.9–10.3)
CHLORIDE: 106 mmol/L (ref 101–111)
CO2: 28 mmol/L (ref 22–32)
CREATININE: 1.48 mg/dL — AB (ref 0.44–1.00)
GFR, EST AFRICAN AMERICAN: 42 mL/min — AB (ref >60)
GFR, EST NON AFRICAN AMERICAN: 36 mL/min — AB (ref >60)
Glucose, Bld: 96 mg/dL (ref 65–99)
Potassium: 4 mmol/L (ref 3.5–5.1)
SODIUM: 140 mmol/L (ref 135–145)

## 2015-03-06 LAB — CBC
HCT: 31.2 % — ABNORMAL LOW (ref 36.0–46.0)
HEMOGLOBIN: 10.6 g/dL — AB (ref 12.0–15.0)
MCH: 31.3 pg (ref 26.0–34.0)
MCHC: 34 g/dL (ref 30.0–36.0)
MCV: 92 fL (ref 78.0–100.0)
PLATELETS: 477 10*3/uL — AB (ref 150–400)
RBC: 3.39 MIL/uL — ABNORMAL LOW (ref 3.87–5.11)
RDW: 16 % — AB (ref 11.5–15.5)
WBC: 12.2 10*3/uL — ABNORMAL HIGH (ref 4.0–10.5)

## 2015-03-06 MED ORDER — MAGNESIUM OXIDE 400 (241.3 MG) MG PO TABS
400.0000 mg | ORAL_TABLET | Freq: Two times a day (BID) | ORAL | Status: DC
Start: 2015-03-06 — End: 2015-03-06
  Administered 2015-03-06: 400 mg via ORAL
  Filled 2015-03-06: qty 1

## 2015-03-06 MED ORDER — BOOST / RESOURCE BREEZE PO LIQD: 1.0000 | Freq: Three times a day (TID) | ORAL | Status: AC

## 2015-03-06 MED ORDER — LIDOCAINE 5 % EX PTCH
1.0000 | MEDICATED_PATCH | CUTANEOUS | Status: DC
Start: 2015-03-06 — End: 2015-03-06

## 2015-03-06 MED ORDER — LIDOCAINE 5 % EX PTCH: 1.0000 | MEDICATED_PATCH | CUTANEOUS | Status: AC

## 2015-03-06 MED ORDER — MAGNESIUM OXIDE 400 (241.3 MG) MG PO TABS: 400.0000 mg | ORAL_TABLET | Freq: Two times a day (BID) | ORAL | Status: AC

## 2015-03-06 MED ORDER — LEVOFLOXACIN 750 MG PO TABS: 750.0000 mg | ORAL_TABLET | ORAL | Status: AC

## 2015-03-06 MED ORDER — LEVOFLOXACIN 750 MG PO TABS
750.0000 mg | ORAL_TABLET | ORAL | Status: DC
  Administered 2015-03-06: 750 mg via ORAL
  Filled 2015-03-06: qty 1

## 2015-03-06 MED ORDER — PROMETHAZINE HCL 25 MG PO TABS: 25.0000 mg | ORAL_TABLET | Freq: Four times a day (QID) | ORAL | Status: AC | PRN

## 2015-03-06 NOTE — Discharge Instructions (Signed)
HOME HEALTH:  Please drain Pleur-x catheter every MWF

## 2015-03-06 NOTE — Progress Notes (Addendum)
Patient's Husband called with problem getting Lidocaine Patch from the pharmacy.  MD made aware and advised me to tell the patient to get the insurance company to call the MD's office.  Patient made aware and will await for further instructions.  4:08 PM Dr. Tana Coast requested patient's pharmacy information and number given to her via text page.

## 2015-03-06 NOTE — Care Management Important Message (Signed)
Important Message  Patient Details  Name: Kendra Mcfarland MRN: [id number] Date of Birth: May 06, 1951   Medicare Important Message Given:  Yes-second notification given    Nathen May 03/06/2015, 1:43 PM

## 2015-03-06 NOTE — Progress Notes (Signed)
Claire Shown to be D/C'd Home per MD order.  Discussed with the patient and all questions fully answered.  VSS, Skin clean, dry and intact without evidence of skin break down, no evidence of skin tears noted. IV catheter discontinued intact. Site without signs and symptoms of complications. Dressing and pressure applied.  An After Visit Summary was printed and given to the patient. Patient received prescription.  D/c education completed with patient/family including follow up instructions, medication list, d/c activities limitations if indicated, with other d/c instructions as indicated by MD - patient able to verbalize understanding, all questions fully answered.   Patient instructed to return to ED, call 911, or call MD for any changes in condition.   Patient escorted via [place], and D/C home via private auto.  Malcolm Metro 03/06/2015 11:25 AM

## 2015-03-06 NOTE — Care Management Note (Signed)
Case Management Note  Patient Details  Name: Kendra Mcfarland MRN: [id number] Date of Birth: 1951-04-28  Subjective/Objective:      Patient is for dc today, she has pleurx drain kit in her room, her spouse [facility] for [facility] and HHPT,  Patient will need to be drained on Friday 03/07/15 and then start with Mon- Wed- Friday.  Miranda with [facility] informed about this information, per Apolonio Schneiders RN patient will not need any oxygen.  Soc will begin on 03/07/15 with [facility].              Action/Plan:   Expected Discharge Date:                  Expected Discharge Plan:  [place]  In-House Referral:     Discharge planning Services  CM Consult  Post Acute Care Choice:  Home Health Choice offered to:  Patient, Spouse  DME Arranged:    DME Agency:     HH Arranged:  RN, PT [place] Agency:  [facility]  Status of Service:  Completed, signed off  Medicare Important Message Given:    Date Medicare IM Given:    Medicare IM give by:    Date Additional Medicare IM Given:    Additional Medicare Important Message give by:     If discussed at [place] of Stay Meetings, dates discussed:    Additional Comments:  Zenon Mayo, RN 03/06/2015, 11:18 AM

## 2015-03-06 NOTE — Discharge Summary (Signed)
Physician Discharge Summary   Patient ID: Kendra Mcfarland MRN: [id number] DOB/AGE: 01-04-1951 64 y.o.  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 Pleural effusion . Hypotension . Metastatic renal cell carcinoma ([place]) . Hypothyroidism . Acute kidney injury [facility])  Consults:  CT surgery, Dr. Servando Snare   Recommendations for Outpatient Follow-up:  Patient was recommended chest x-ray on the day of appointment with CT surgery in 1 week  Home health RN, PT arranged  Per CT surgery, will need home health to drain Pleurx tomorrow and regular schedule of Monday Wednesday Friday drainage    TESTS THAT NEED FOLLOW-UP CBC, BMET   DIET: regular diet    Allergies:   Allergies  Allergen Reactions  . Buprenorphine Hcl Nausea And Vomiting  . Hydrocodone Nausea And Vomiting  . Morphine And Related Nausea And Vomiting  . Oxycodone Nausea And Vomiting  . Tramadol Nausea And Vomiting  . Naproxen Sodium Hives, Itching, Rash and Other (See Comments)    Can take advil   . Penicillins     **Pt reports her mother had a severe rxn to PCN and she has been afraid to try it herself.  Tolerates cephalosporins.  Tolerated Zosyn Oct 2016.  . Sulfonamide Derivatives Hives, Itching, Rash and Other (See Comments)    dypsnea also     Discharge Medications:   Medication List    STOP taking these medications        amLODipine 10 MG tablet  Commonly known as:  NORVASC     carvedilol 3.125 MG tablet  Commonly known as:  COREG      TAKE these medications        acetaminophen 500 MG tablet  Commonly known as:  TYLENOL  Take 500 mg by mouth every 6 (six) hours as needed (For pain.).     feeding supplement Liqd  Take 1 Container by mouth 3 (three) times daily between meals. Can get over the  counter     levofloxacin 750 MG tablet  Commonly known as:  LEVAQUIN  Take 1 tablet (750 mg total) by mouth every other day. X 3 doses     levothyroxine 88 MCG tablet  Commonly known as:  SYNTHROID, LEVOTHROID  Take 88 mcg by mouth daily before breakfast.     lidocaine 5 %  Commonly known as:  LIDODERM  Place 1 patch onto the skin daily. Remove & Discard patch within 12 hours or as directed by MD     LORazepam 0.5 MG tablet  Commonly known as:  ATIVAN  Take 0.5 mg by mouth every 6 (six) hours as needed. For nausea per husband     magnesium oxide 400 (241.3 MG) MG tablet  Commonly known as:  MAG-OX  Take 1 tablet (400 mg total) by mouth 2 (two) times daily. X 1 week     ondansetron 4 MG tablet  Commonly known as:  ZOFRAN  Take 4 mg by mouth every 8 (eight) hours as needed for nausea or vomiting.     promethazine 25 MG tablet  Commonly known as:  PHENERGAN  Take 1 tablet (25 mg total) by mouth every 6 (six) hours as needed for nausea.     rosuvastatin 10 MG tablet  Commonly known as:  CRESTOR  Take 10 mg by mouth daily with supper.  Brief H and P: For complete details please refer to admission H and P, but in brief Patient is a 64 year old female with hypertension, hyperlipidemia, metastatic renal cell carcinoma, metastasized to lungs, receiving chemotherapy at [facility] was recently started on Sutent presented to ED with worsening shortness of breath. History was obtained from the patient and her husband in the room. Patient reported that she noticed shortness of breath in the last 10 days, progressively worsening in the last 3 days. Patient went to her PCP and was treated with oral Levaquin for possible pneumonia. She denies any fevers or chills, productive cough, any vomiting or aspiration. Patient noticed progressively worsening shortness of breath, sitting upright, having some right-sided chest pain, worse on sitting up. ED workup showed BMET unremarkable except BUN 14,  creatinine 1.5, CBC showed white count of 11.0, otherwise unremarkable. Troponin negative. Chest x-ray showed new large right pleural effusion with patchy consolidation in the mid to lower right lung likely combination of atelectasis pneumonia or tumor, slight progression of pulmonary metastatic disease to both lungs. 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  -Patient was placed on IV vancomycin and Zosyn for parapneumonic effusion, change to oral Levaquin for 3 more doses to complete full course. Patient had been on Levaquin prior to admission.   Metastatic renal cell carcinoma ([place]), metastasis to both lungs - Chemotherapy currently on hold.    Hypothyroidism -TSH 0.38 continue Synthroid   Hypotension - improving today, continue to hold the Coreg and amlodipine   Acute kidney injury ([place]) with underlying history of renal cell carcinoma - Patient presented with creatinine of 1.8, baseline 1.2-1.3, likely some dehydration  - Creatinine improving to 1.4 at the time of discharge.  Hypokalemia , hypomagnesemia -Resolved after IV and oral replacement.  Severe protein calorie malnutrition, hypoalbuminemia  - Albumin 1.5, received albumin infusion at admission - Nutrition consulted, patient was started on Boost   Day of Discharge BP 101/44 mmHg  Pulse 100  Temp(Src) 97.6 F (36.4 C) (Oral)  Resp 16  Ht 4\' 11"  (1.499 m)  Wt 58.1 kg (128 lb 1.4 oz)  BMI 25.86 kg/m2  SpO2 95%  Physical Exam: General: Alert and awake oriented x3 not in any acute distress. HEENT: anicteric sclera, pupils reactive to  light and accommodation CVS: S1-S2 clear no murmur rubs or gallops Chest: Decreased breath sounds, rt >Lt Abdomen: soft nontender, nondistended, normal bowel sounds Extremities: no cyanosis, clubbing or edema noted bilaterally Neuro: Cranial nerves II-XII intact, no focal neurological deficits   The results of significant diagnostics from this hospitalization (including imaging, microbiology, ancillary and laboratory) are listed below for reference.    LAB RESULTS: Basic Metabolic Panel:  Recent Labs Lab 03/05/15 0518 03/05/15 1546 03/06/15 0525  NA 139  --  140  K 2.9* 3.5 4.0  CL 102  --  106  CO2 28  --  28  GLUCOSE 98  --  96  BUN 13  --  12  CREATININE 1.52*  --  1.48*  CALCIUM 7.3*  --  7.5*  MG 1.5*  --   --    Liver Function Tests:  Recent Labs Lab 03/03/15 1850  03/04/15 0904  AST 46*  52*  --   ALT 30  29  --   ALKPHOS 193*  190*  --   BILITOT 0.5  0.5  --   PROT 5.3*  5.6* 5.1*  ALBUMIN 1.5*  1.5*  --    No results for input(s): LIPASE, AMYLASE in the last 168 hours. No results for input(s): AMMONIA in the last 168 hours. CBC:  Recent Labs Lab 03/05/15 0518 03/06/15 0525  WBC 10.2 12.2*  HGB 11.3* 10.6*  HCT 33.0* 31.2*  MCV 93.5 92.0  PLT 448* 477*   Cardiac Enzymes: No results for input(s): CKTOTAL, CKMB, CKMBINDEX, TROPONINI in the last 168 hours. BNP: Invalid input(s): POCBNP CBG: No results for input(s): GLUCAP in the last 168 hours.  Significant Diagnostic Studies:  Dg Chest 2 View  03/03/2015  CLINICAL DATA:  Shortness of breath. Metastatic renal cell carcinoma. EXAM: CHEST  2 VIEW COMPARISON:  11/18/2014 chest radiograph FINDINGS: There is a large right pleural effusion, which is new. No left pleural effusion. No pneumothorax. Stable cardiomediastinal silhouette with normal heart size. There are patchy nodular opacities throughout both lungs, in keeping with known pulmonary metastatic disease, with slightly increased number and  size of pulmonary nodules, largest 1.6 cm in the left upper lung. There is patchy consolidation in the mid to lower right lung. IMPRESSION: 1. New large right pleural effusion. 2. Patchy consolidation in the mid to lower right lung, likely a combination of atelectasis, pneumonia and/or tumor. 3. Slight progression of pulmonary metastatic disease in both lungs. Electronically Signed   By: Ilona Sorrel M.D.   On: 03/03/2015 14:13   Dg Chest Port 1 View  03/04/2015  CLINICAL DATA:  PleurX catheter placement EXAM: PORTABLE CHEST 1 VIEW COMPARISON:  Yesterday FINDINGS: Right chest tube placed. Right pleural effusion improved. No pneumothorax. Ill-defined opacities compatible with metastatic disease are seen throughout both lungs. Normal heart size. IMPRESSION: Right chest tube placed, PleurX. Right pleural effusion improved and no pneumothorax. Otherwise stable study. Electronically Signed   By: Marybelle Killings M.D.   On: 03/04/2015 09:46   Dg C-arm 1-60 Min-no Report  03/04/2015  CLINICAL DATA: Intra-op; right pleural catheter placement C-ARM 1-60 MINUTES Fluoroscopy was utilized by the requesting physician.  No radiographic interpretation.    2D ECHO:   Disposition and Follow-up: Discharge Instructions    Diet general    Complete by:  As directed      Discharge instructions    Complete by:  As directed   Please stop all the BP meds, Norvasc and Coreg  Home health Nurse will start tomorrow. Please document all output, draining Monday, Wednesday and Friday.     Increase activity slowly    Complete by:  As directed             DISPOSITION: Home with home health PT, RN   DISCHARGE FOLLOW-UP     Follow-up Information    Follow up with Grace Isaac, MD On 03/11/2015.   Specialty:  Cardiothoracic Surgery   Why:  Appointment is at 4:30pm   Contact information:   [place] [address] [phone]       Follow up with [facility] On 03/11/2015.    Why:  Appointment is at 4:00pm   Contact information:   [facility]       Follow up with Lottie Dawson, MD. Schedule an appointment as soon as possible for a visit in 2 weeks.  Specialties:  Internal Medicine, Pediatrics   Why:  for hospital follow-up, As needed   Contact information:   [place] [address] [phone]        Time spent on Discharge: 35 mins   Signed:   Hyman Crossan M.D. [facility] 03/06/2015, 10:59 AM Pager: [phone]

## 2015-03-06 NOTE — Progress Notes (Signed)
SATURATION QUALIFICATIONS: (This note is used to comply with regulatory documentation for home oxygen)  Patient Saturations on Room Air at Rest = 96%  Patient Saturations on Room Air while Ambulating =  95%  Patient Saturations on 0 Liters of oxygen while Ambulating  95%  Please briefly explain why patient needs home oxygen:  

## 2015-03-06 NOTE — Progress Notes (Signed)
      [address]       [place] [address]             [phone]      2 Days Post-Op Procedure(s) (LRB): RIGHT PLEURX CATHETER PLACEMENT (Right)   Subjective:  No complaints  Objective: Vital signs in last 24 hours: Temp:  [97.7 F (36.5 C)-99.2 F (37.3 C)] 99.2 F (37.3 C) (10/20 0525) Pulse Rate:  [97-104] 97 (10/20 0525) Cardiac Rhythm:  [-] Sinus tachycardia (10/20 0810) Resp:  [15-28] 15 (10/20 0525) BP: (87-113)/(34-44) 101/44 mmHg (10/20 0525) SpO2:  [91 %-95 %] 91 % (10/20 0525)  Intake/Output from previous day: 10/19 0701 - 10/20 0700 In: 325 [I.V.:75; IV Piggyback:250] Out: 350 [Urine:300; Chest Tube:50] Intake/Output this shift: Total I/O In: 240 [P.O.:240] Out: -   General appearance: alert, cooperative and no distress Heart: regular rate and rhythm Lungs: diminished breath sounds bibasilar Abdomen: soft, non-tender; bowel sounds normal; no masses,  no organomegaly Wound: clean and dry  Lab Results:  Recent Labs  03/05/15 0518 03/06/15 0525  WBC 10.2 12.2*  HGB 11.3* 10.6*  HCT 33.0* 31.2*  PLT 448* 477*   BMET:  Recent Labs  03/05/15 0518 03/05/15 1546 03/06/15 0525  NA 139  --  140  K 2.9* 3.5 4.0  CL 102  --  106  CO2 28  --  28  GLUCOSE 98  --  96  BUN 13  --  12  CREATININE 1.52*  --  1.48*  CALCIUM 7.3*  --  7.5*    PT/INR:  Recent Labs  03/03/15 1850  LABPROT 15.6*  INR 1.22   ABG No results found for: PHART, HCO3, TCO2, ACIDBASEDEF, O2SAT CBG (last 3)  No results for input(s): GLUCAP in the last 72 hours.  Assessment/Plan: S/P Procedure(s) (LRB): RIGHT PLEURX CATHETER PLACEMENT (Right)  1. RIght Pleur-x catheter in place- patient is okay to discharge from our standpoint- will need H/H to drain Pleur-x tomorrow and regular schedule of MWF drainage.... Husband instructed to keep track of drainage amounts and to call our office with output 2. Follow up in our office in 1 week with CXR 3. Care  per primary   LOS: 3 days    Anays Detore, Junie Panning 03/06/2015

## 2015-03-07 DIAGNOSIS — J90 Pleural effusion, not elsewhere classified: Secondary | ICD-10-CM

## 2015-03-07 NOTE — Telephone Encounter (Signed)
Ok to make 30 minutes appt in 2-3 weeks from oct 20  [place]

## 2015-03-10 ENCOUNTER — Other Ambulatory Visit: Payer: Self-pay | Admitting: Cardiothoracic Surgery

## 2015-03-10 DIAGNOSIS — J90 Pleural effusion, not elsewhere classified: Secondary | ICD-10-CM

## 2015-03-11 ENCOUNTER — Ambulatory Visit (INDEPENDENT_AMBULATORY_CARE_PROVIDER_SITE_OTHER): Payer: Self-pay | Admitting: Cardiothoracic Surgery

## 2015-03-11 ENCOUNTER — Ambulatory Visit
Admission: RE | Admit: 2015-03-11 | Discharge: 2015-03-11 | Disposition: A | Discharge status: Home / Self Care | Payer: Medicare Other | Source: Ambulatory Visit | Attending: Cardiothoracic Surgery | Admitting: Cardiothoracic Surgery

## 2015-03-11 ENCOUNTER — Encounter: Payer: Self-pay | Admitting: Cardiothoracic Surgery

## 2015-03-11 VITALS — BP 83/46 | HR 114 | Resp 16 | Ht 59.0 in | Wt 128.0 lb

## 2015-03-11 DIAGNOSIS — J90 Pleural effusion, not elsewhere classified: Secondary | ICD-10-CM

## 2015-03-11 DIAGNOSIS — J948 Other specified pleural conditions: Secondary | ICD-10-CM

## 2015-03-12 NOTE — Progress Notes (Signed)
[address]       [place] [address]             [phone]      Ophia J Merten [place] Medical Record #[id number] Date of Birth: 1950/07/23  Referring: Alphonzo Lemmings, MD Primary Care: Lottie Dawson, MD  Chief Complaint:   POST OP FOLLOW UP 03/04/2015  OPERATIVE REPORT PREOPERATIVE DIAGNOSIS: Large right pleural effusion. POSTOPERATIVE DIAGNOSIS: Large right pleural effusion. PROCEDURE PERFORMED: Placement of right PleurX catheter with drainage of pleural effusion with fluoroscopic and ultrasound guidance. SURGEON: Lanelle Bal, M.D.  History of Present Illness:      Patient returns office today with a follow-up chest x-ray after placement acutely of Pleurx catheter last week. [facility] increasing shortness of breath was found to have almost complete white out of the right chest.    Past Medical History  Diagnosis Date  . Anemia     years ago  . Transfusion history     before her hysterectomy  . Anxiety   . Hyperlipidemia   . Hypertension   . Spinal stenosis   . Spondylosis     degenerative adult  . Bulging lumbar disc   . MONONUCLEOSIS 08/14/2007    Qualifier: Diagnosis of  Problem Stop Reason: Removed By: Hulan Saas, CMA (AAMA), [place]   . Vertigo   . Iron disorder     excessive iron storage   . Cancer ([place])      History  Smoking status  . Former Smoker  . Quit date: 05/17/1994  Smokeless tobacco  . Never Used    History  Alcohol Use  . Yes    Comment: rare     Allergies  Allergen Reactions  . Buprenorphine Hcl Nausea And Vomiting  . Hydrocodone Nausea And Vomiting  . Morphine And Related Nausea And Vomiting  . Oxycodone Nausea And Vomiting  . Tramadol Nausea And Vomiting  . Naproxen Sodium Hives, Itching, Rash and Other (See Comments)    Can take advil   . Penicillins     **Pt reports her mother had a severe rxn to PCN and she has been afraid to try it  herself.  Tolerates cephalosporins.  Tolerated Zosyn Oct 2016.  . Sulfonamide Derivatives Hives, Itching, Rash and Other (See Comments)    dypsnea also    Current Outpatient Prescriptions  Medication Sig Dispense Refill  . acetaminophen (TYLENOL) 500 MG tablet Take 500 mg by mouth every 6 (six) hours as needed (For pain.).    Marland Kitchen feeding supplement (BOOST / RESOURCE BREEZE) LIQD Take 1 Container by mouth 3 (three) times daily between meals. Can get over the counter 60 Container 3  . levofloxacin (LEVAQUIN) 750 MG tablet Take 1 tablet (750 mg total) by mouth every other day. X 3 doses 3 tablet 0  . levothyroxine (SYNTHROID, LEVOTHROID) 88 MCG tablet Take 88 mcg by mouth daily before breakfast.    . lidocaine (LIDODERM) 5 % Place 1 patch onto the skin daily. Remove & Discard patch within 12 hours or as directed by MD 3 patch 0  . LORazepam (ATIVAN) 0.5 MG tablet Take 0.5 mg by mouth every 6 (six) hours as needed. For nausea per husband  1  . magnesium oxide (MAG-OX) 400 (241.3 MG) MG tablet Take 1 tablet (400 mg total) by mouth 2 (two) times daily. X 1 week 14 tablet 0  . ondansetron (ZOFRAN) 4 MG tablet Take 4  mg by mouth every 8 (eight) hours as needed for nausea or vomiting.   0  . promethazine (PHENERGAN) 25 MG tablet Take 1 tablet (25 mg total) by mouth every 6 (six) hours as needed for nausea. 30 tablet 0  . rosuvastatin (CRESTOR) 10 MG tablet Take 10 mg by mouth daily with supper.     No current facility-administered medications for this visit.       Physical Exam: BP 83/46 mmHg  Pulse 114  Resp 16  Ht 4\' 11"  (1.499 m)  Wt 128 lb (58.06 kg)  BMI 25.84 kg/m2  SpO2 84%  General appearance: alert, cooperative, cachectic and no distress Neurologic: intact Heart: regular rate and rhythm, S1, S2 normal, no murmur, click, rub or gallop Lungs: diminished breath sounds bibasilar Abdomen: soft, non-tender; bowel sounds normal; no masses,  no organomegaly Extremities: extremities  normal, atraumatic, no cyanosis or edema and Homans sign is negative, no sign of DVT Wound: minimiual drainage today from pleurix   Diagnostic Studies & Laboratory data:     Recent Radiology Findings:   Dg Chest 2 View  03/11/2015  CLINICAL DATA:  Cancer, pleural effusion, chest tube insertion EXAM: CHEST  2 VIEW COMPARISON:  03/05/2015 FINDINGS: Bilateral pulmonary nodules, measuring up to 1.9 cm in the left upper lobe. Layering moderate right pleural effusion with indwelling pleural drain. No pneumothorax. Right paratracheal adenopathy. The heart is normal in size. IMPRESSION: Layering moderate right pleural effusion with indwelling pleural drain. No pneumothorax. Bilateral pulmonary nodules/ metastases. Right paratracheal adenopathy. Electronically Signed   By: Julian Hy M.D.   On: 03/11/2015 16:55     Recent Lab Findings: Lab Results  Component Value Date   WBC 12.2* 03/06/2015   HGB 10.6* 03/06/2015   HCT 31.2* 03/06/2015   PLT 477* 03/06/2015   GLUCOSE 96 03/06/2015   CHOL 142 06/12/2014   TRIG 235* 06/12/2014   HDL 32* 06/12/2014   LDLDIRECT 214.5 03/28/2008   LDLCALC 63 06/12/2014   ALT 30 03/03/2015   ALT 29 03/03/2015   AST 46* 03/03/2015   AST 52* 03/03/2015   NA 140 03/06/2015   K 4.0 03/06/2015   CL 106 03/06/2015   CREATININE 1.48* 03/06/2015   BUN 12 03/06/2015   CO2 28 03/06/2015   TSH 0.384 03/03/2015   INR 1.22 03/03/2015      Assessment / Plan:      Widespread metastatic renal cell carcinoma, with significant right pleural effusion now partially controlled with Pleurx catheter, the drainage has decreased to almost none. Today in the office the patient's O2 sats are low, we have made referral to home health care to obtain home oxygen for the patient and also other home health needs for mobility. Patient has follow-up visit with her oncologist in 2 days [facility]. We'll continue to assist patient in managing the Pleurx catheter. We'll  follow-up with repeat chest x-ray in approximately 3 weeks, continue to monitor output and adjust drainage interval per the drainage amount.   Grace Isaac MD      [address] [place] [address] Office [phone]   Beeper [phone]  03/12/2015 5:34 PM

## 2015-03-25 ENCOUNTER — Ambulatory Visit: Payer: Medicare Other | Admitting: Internal Medicine

## 2015-04-03 ENCOUNTER — Encounter: Payer: Medicare Other | Admitting: Cardiothoracic Surgery

## 2015-04-17 DEATH — deceased

## 2015-06-16 IMAGING — CT CT BIOPSY
1 of 3 series · 14 of 32 positions shown, 19 images · non-contrast
Comparison: none

CLINICAL DATA: 63-year-old female with chronic anemia of uncertain
etiology. Bone marrow biopsy is warranted to facilitate diagnosis.
TECHNIQUE: Informed consent was obtained from the patient following explanation
of the procedure, risks, benefits and alternatives. The patient
understands, agrees and consents for the procedure. All questions
were addressed. A time out was performed.

[Series 2: bonemarrowbx · axial · 0.74mm/px · z∈[-90,-14]mm · 14 of 19 slices shown, 19 images]
[im 2/19  soft-tissue]
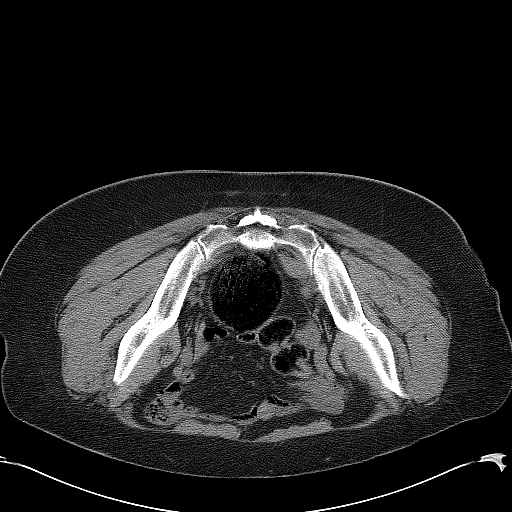
[im 2/19  bone]
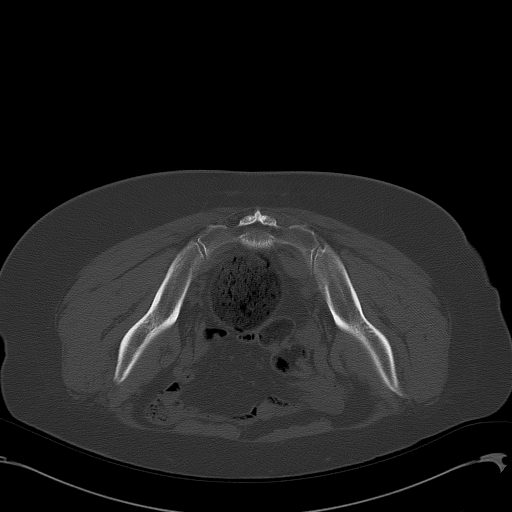
[im 3/19  soft-tissue]
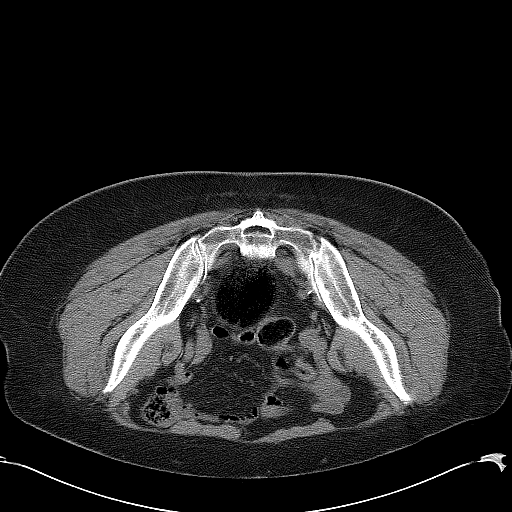
[im 4/19  soft-tissue]
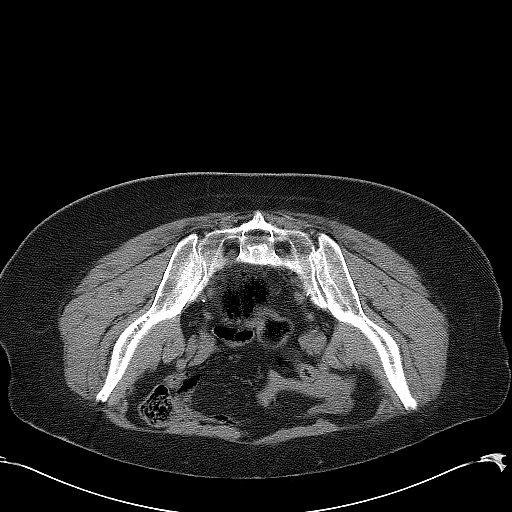
[im 6/19  soft-tissue]
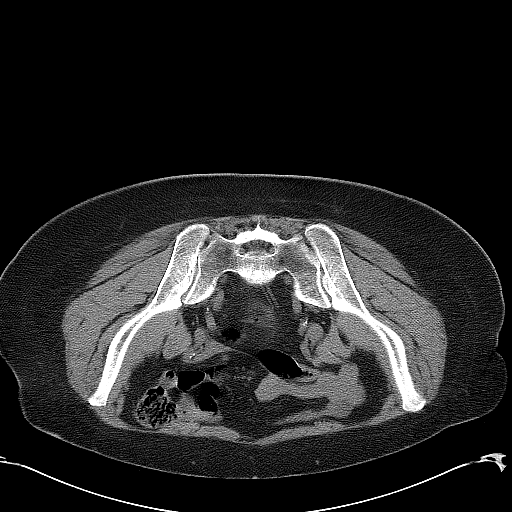
[im 7/19  soft-tissue]
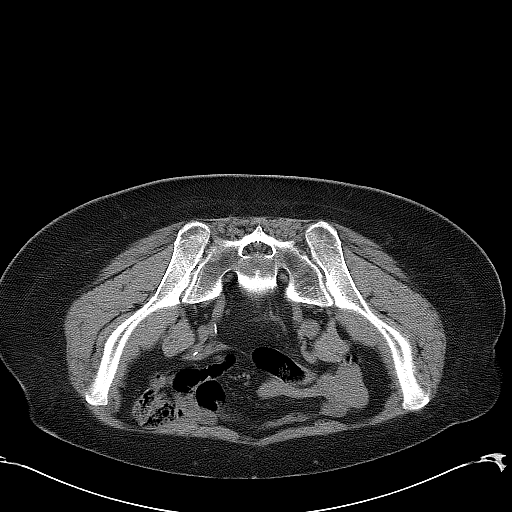
[im 8/19  soft-tissue]
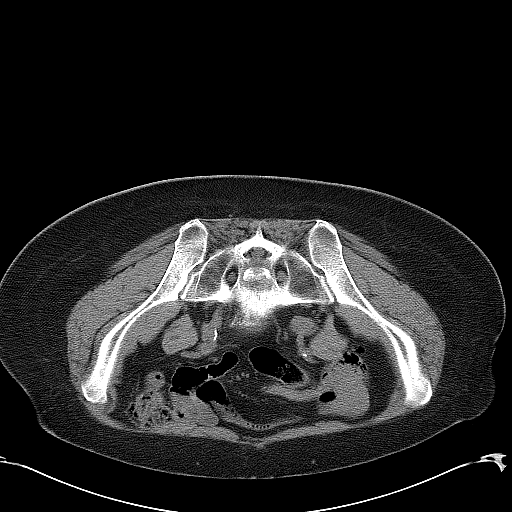
[im 10/19  soft-tissue]
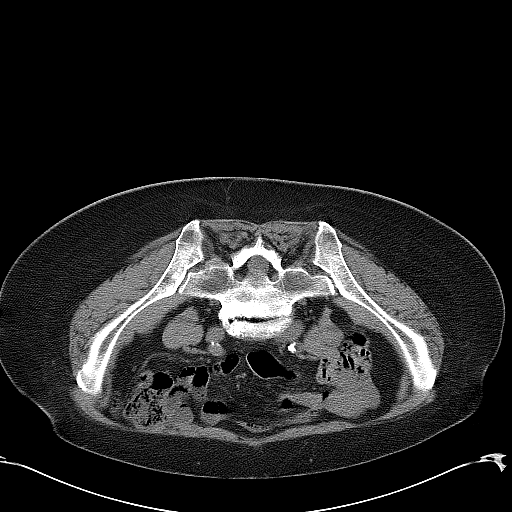
[im 11/19  soft-tissue]
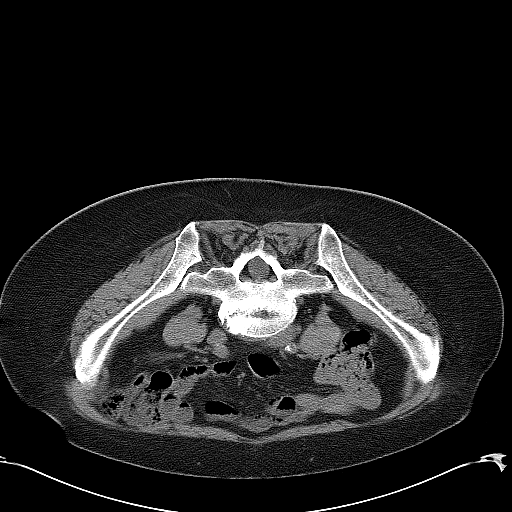
[im 12/19  soft-tissue]
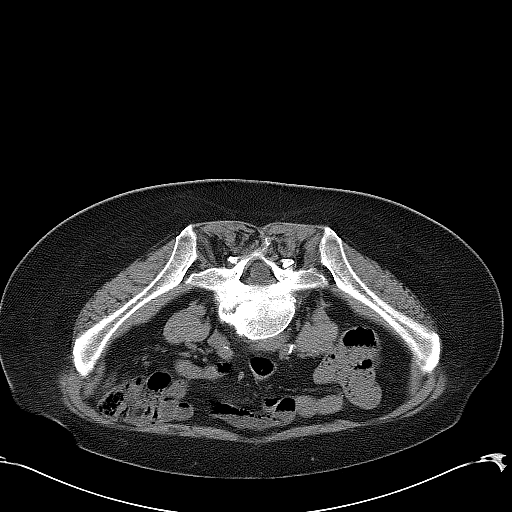
[im 12/19  bone]
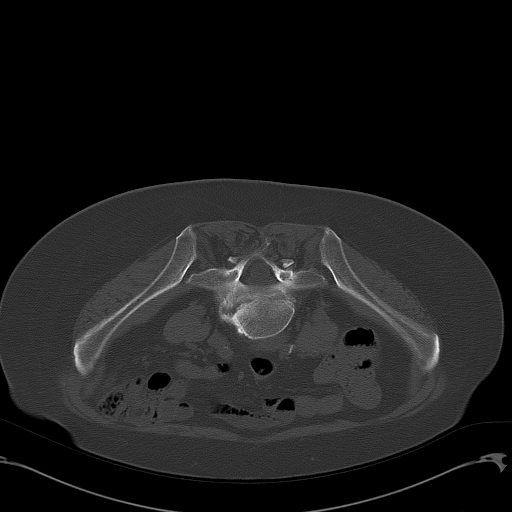
[im 13/19  soft-tissue]
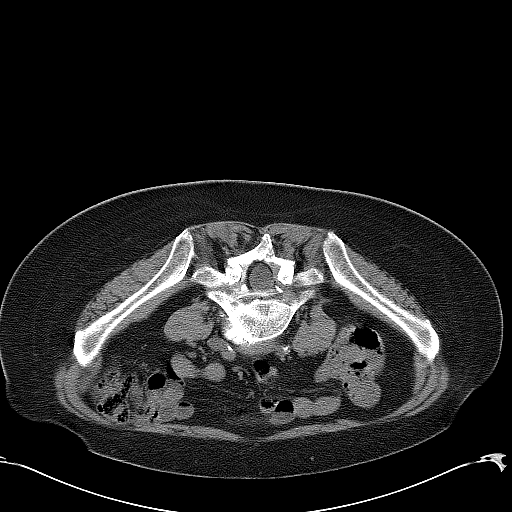
[im 14/19  lung]
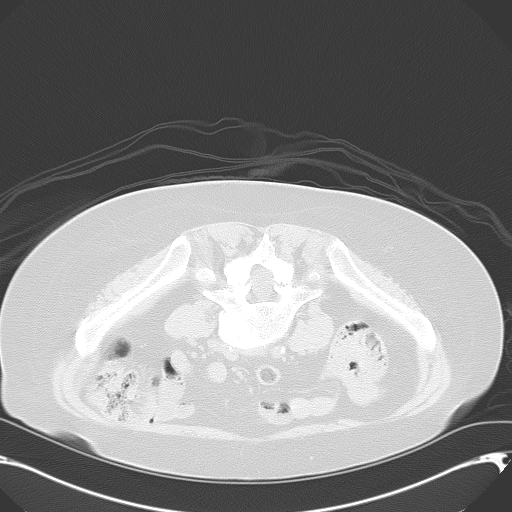
[im 15/19  soft-tissue]
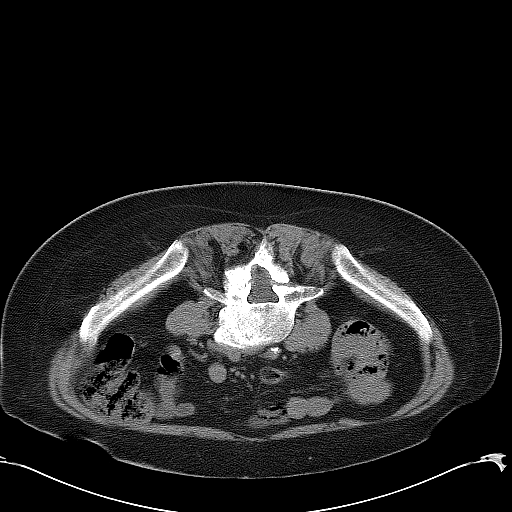
[im 15/19  lung]
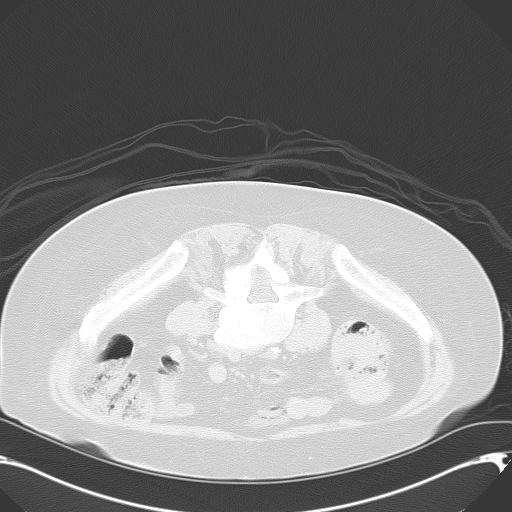
[im 16/19  soft-tissue]
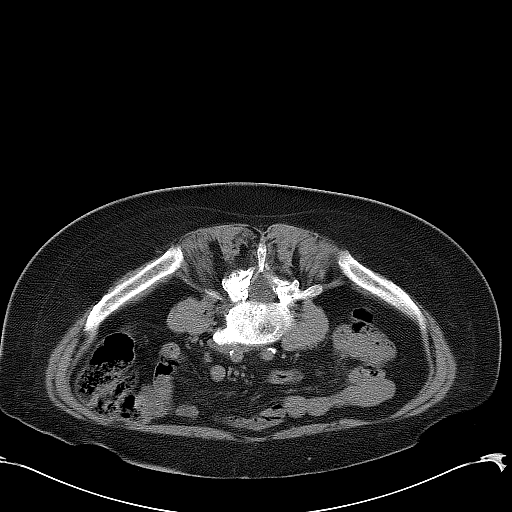
[im 16/19  lung]
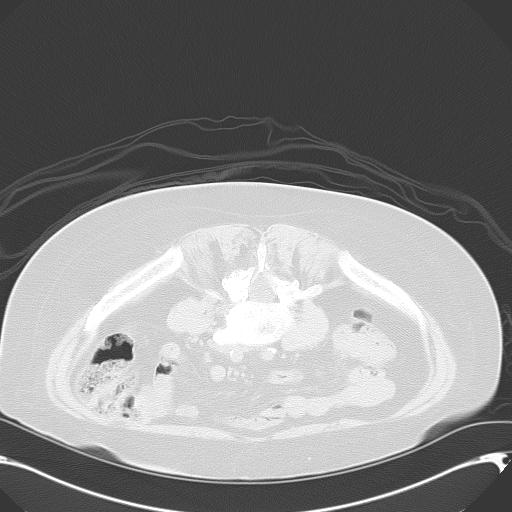
[im 17/19  soft-tissue]
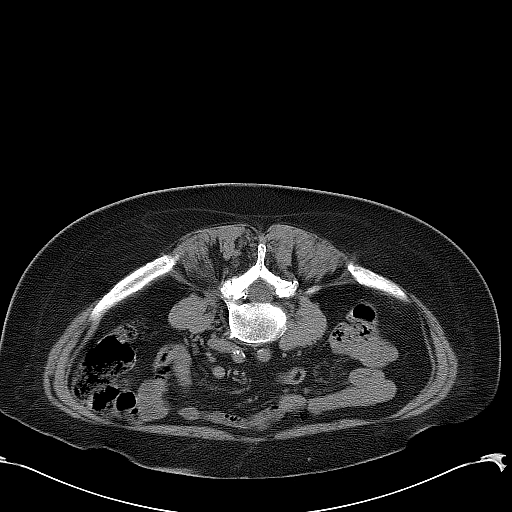
[im 17/19  lung]
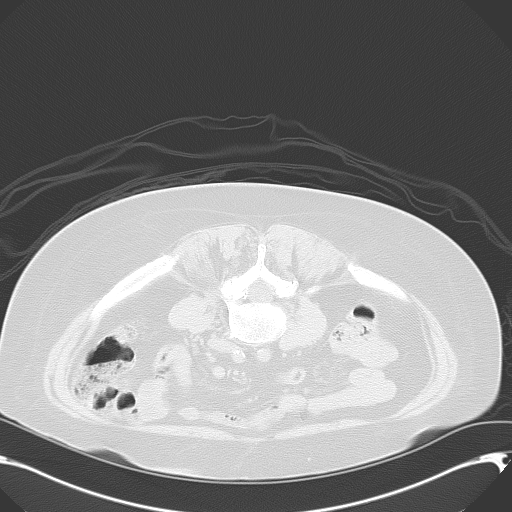

[14 of 32 positions shown; findings below may reference images not displayed]

EXAM:
CT BIOPSY

Date: 02/28/2014

PROCEDURE:
1. CT guided bone marrow aspiration and core biopsy

ANESTHESIA/SEDATION:
Moderate (conscious) sedation was used. 4 mg Versed, 100 mcg
Fentanyl were administered intravenously. The patient's vital signs
were monitored continuously by radiology nursing throughout the
procedure.

Sedation Time: 10 minutes
The patient was positioned prone and noncontrast localization CT was
performed of the pelvis to demonstrate the iliac marrow spaces.

Maximal barrier sterile technique utilized including caps, mask,
sterile gowns, sterile gloves, large sterile drape, hand hygiene,
and betadine prep.

Under sterile conditions and local anesthesia, an 11 gauge coaxial
bone biopsy needle was advanced into the right iliac marrow space.
Needle position was confirmed with CT imaging. Initially, bone
marrow aspiration was performed. Next, the 11 gauge outer cannula
was utilized to obtain a right iliac bone marrow core biopsy. Needle
was removed. Hemostasis was obtained with compression. The patient
tolerated the procedure well. Samples were prepared with the
cytotechnologist. No immediate complications.
IMPRESSION: CT guided right iliac bone marrow aspiration and core biopsy.

## 2015-10-05 IMAGING — CR DG CHEST 2V
2 series · 2 of 2 positions shown · non-contrast
Comparison: CT 04/10/2014.

CLINICAL DATA: Lower chest pain.

EXAM:
CHEST  2 VIEW

[view not recorded (1 of 2)]
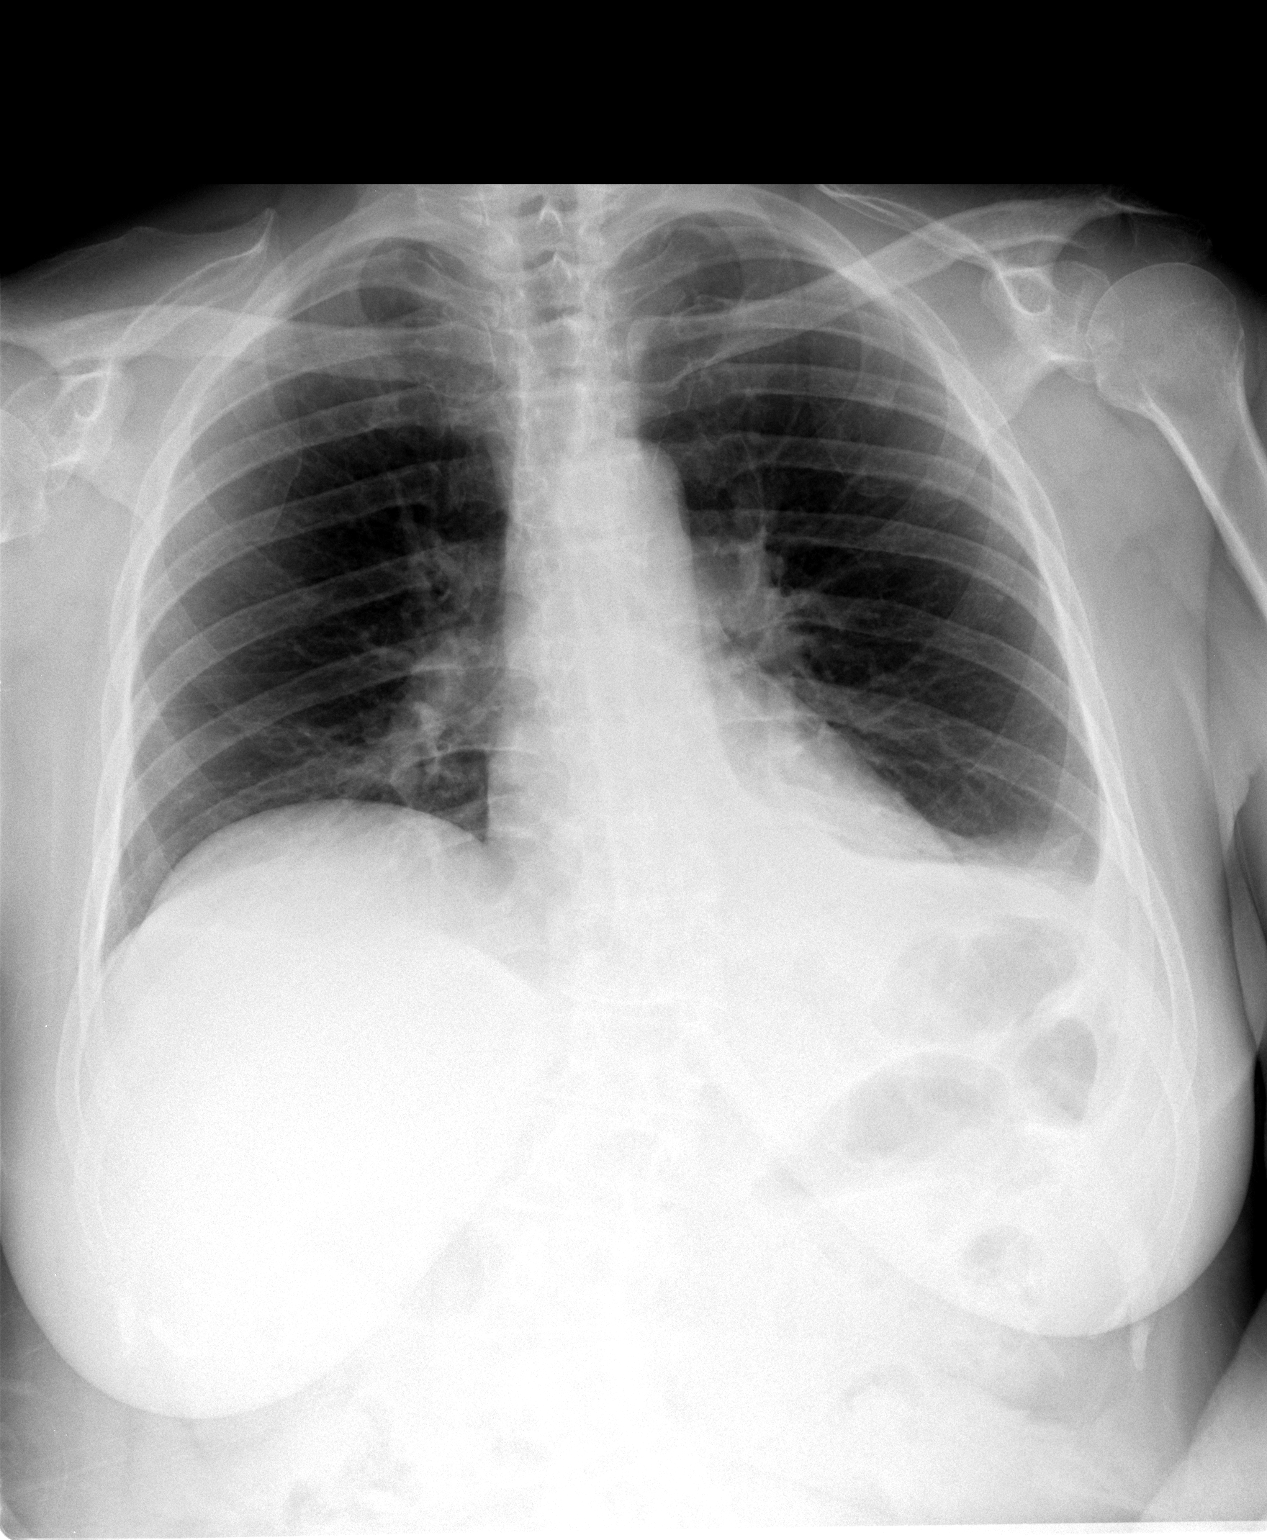

[view not recorded (2 of 2)]
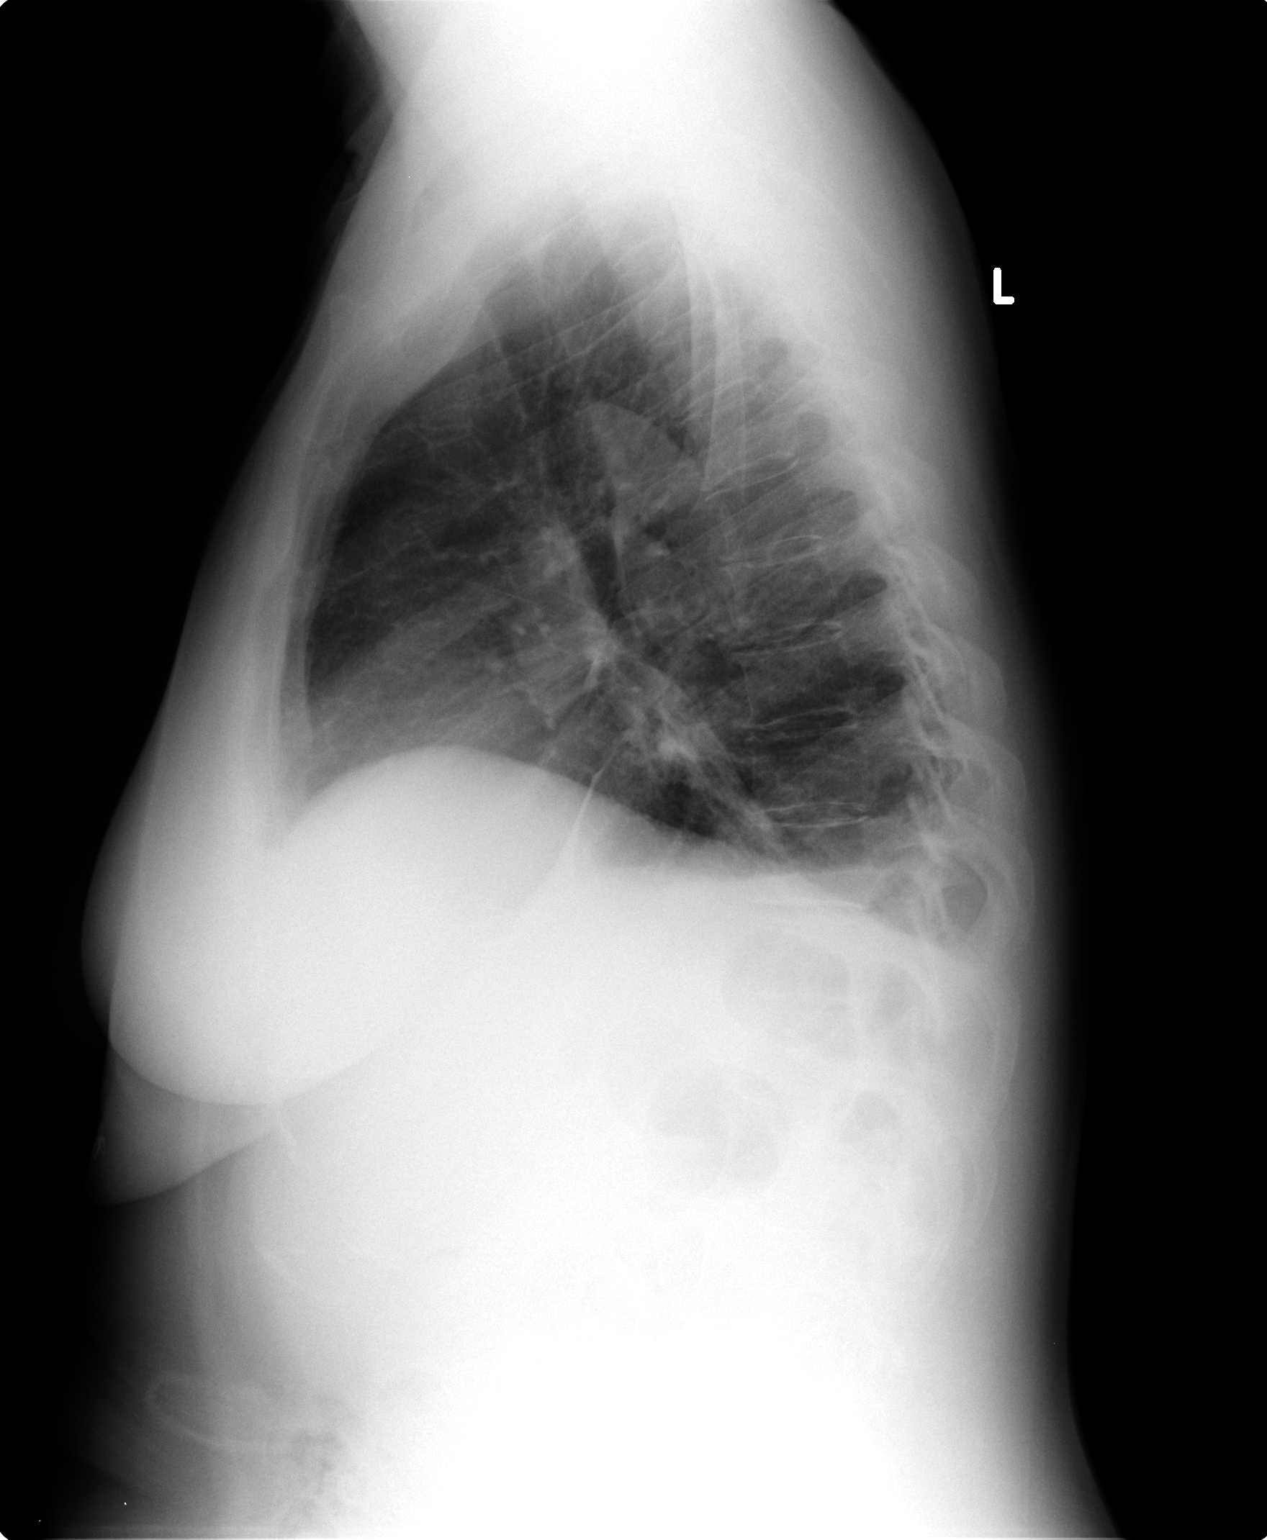

[2 of 2 positions shown; findings below may reference images not displayed]

FINDINGS: Mediastinum and hilar structures are normal. Bibasilar subsegmental
atelectasis. Left lower lobe mild infiltrate with small left pleural
effusion noted. No pneumothorax. Heart size normal. No acute or
focal bony abnormality.
IMPRESSION: Left lower lobe mild infiltrate with small left pleural effusion.

## 2015-11-03 ENCOUNTER — Other Ambulatory Visit: Payer: Self-pay | Admitting: Nurse Practitioner
